# Patient Record
Sex: Female | Born: 1940
Health system: Southern US, Community
[De-identification: ages and names within clinical notes are randomized; demographics above are authoritative.]

## PROBLEM LIST (undated history)

## (undated) DIAGNOSIS — E785 Hyperlipidemia, unspecified: Secondary | ICD-10-CM

## (undated) DIAGNOSIS — Z83518 Family history of other specified eye disorder: Secondary | ICD-10-CM

## (undated) DIAGNOSIS — J449 Chronic obstructive pulmonary disease, unspecified: Secondary | ICD-10-CM

## (undated) DIAGNOSIS — N760 Acute vaginitis: Secondary | ICD-10-CM

## (undated) DIAGNOSIS — B9689 Other specified bacterial agents as the cause of diseases classified elsewhere: Secondary | ICD-10-CM

## (undated) DIAGNOSIS — N390 Urinary tract infection, site not specified: Secondary | ICD-10-CM

## (undated) HISTORY — PX: FRACTURE SURGERY: SHX138

## (undated) HISTORY — DX: Family history of other specified eye disorder: Z83.518

## (undated) HISTORY — DX: Urinary tract infection, site not specified: N39.0

## (undated) HISTORY — DX: Acute vaginitis: N76.0

## (undated) HISTORY — DX: Hyperlipidemia, unspecified: E78.5

## (undated) HISTORY — DX: Other specified bacterial agents as the cause of diseases classified elsewhere: B96.89

## (undated) HISTORY — DX: Chronic obstructive pulmonary disease, unspecified: J44.9

---

## 2002-11-07 ENCOUNTER — Encounter: Payer: Self-pay | Admitting: Emergency Medicine

## 2002-11-07 ENCOUNTER — Emergency Department (HOSPITAL_COMMUNITY): Admission: EM | Admit: 2002-11-07 | Discharge: 2002-11-07 | Payer: Self-pay | Admitting: Psychology

## 2003-05-17 ENCOUNTER — Ambulatory Visit (HOSPITAL_COMMUNITY): Admission: RE | Admit: 2003-05-17 | Discharge: 2003-05-17 | Payer: Self-pay | Admitting: Internal Medicine

## 2003-05-17 ENCOUNTER — Encounter: Payer: Self-pay | Admitting: Internal Medicine

## 2004-02-26 ENCOUNTER — Other Ambulatory Visit: Admission: RE | Admit: 2004-02-26 | Discharge: 2004-02-26 | Payer: Self-pay | Admitting: Dermatology

## 2004-05-14 ENCOUNTER — Ambulatory Visit (HOSPITAL_COMMUNITY): Admission: RE | Admit: 2004-05-14 | Discharge: 2004-05-14 | Payer: Self-pay | Admitting: Family Medicine

## 2004-05-21 ENCOUNTER — Ambulatory Visit (HOSPITAL_COMMUNITY): Admission: RE | Admit: 2004-05-21 | Discharge: 2004-05-21 | Payer: Self-pay | Admitting: Ophthalmology

## 2004-06-04 ENCOUNTER — Ambulatory Visit (HOSPITAL_COMMUNITY): Admission: RE | Admit: 2004-06-04 | Discharge: 2004-06-04 | Payer: Self-pay | Admitting: Ophthalmology

## 2005-06-24 ENCOUNTER — Ambulatory Visit (HOSPITAL_COMMUNITY): Admission: RE | Admit: 2005-06-24 | Discharge: 2005-06-24 | Payer: Self-pay | Admitting: Family Medicine

## 2006-06-25 ENCOUNTER — Ambulatory Visit (HOSPITAL_COMMUNITY): Admission: RE | Admit: 2006-06-25 | Discharge: 2006-06-25 | Payer: Self-pay | Admitting: Family Medicine

## 2007-07-09 LAB — HM DEXA SCAN

## 2007-07-12 ENCOUNTER — Ambulatory Visit (HOSPITAL_COMMUNITY): Admission: RE | Admit: 2007-07-12 | Discharge: 2007-07-12 | Payer: Self-pay | Admitting: Family Medicine

## 2008-01-04 ENCOUNTER — Ambulatory Visit (HOSPITAL_COMMUNITY): Admission: RE | Admit: 2008-01-04 | Discharge: 2008-01-04 | Payer: Self-pay | Admitting: Ophthalmology

## 2008-01-18 ENCOUNTER — Ambulatory Visit (HOSPITAL_COMMUNITY): Admission: RE | Admit: 2008-01-18 | Discharge: 2008-01-18 | Payer: Self-pay | Admitting: Ophthalmology

## 2008-07-12 ENCOUNTER — Ambulatory Visit (HOSPITAL_COMMUNITY): Admission: RE | Admit: 2008-07-12 | Discharge: 2008-07-12 | Payer: Self-pay | Admitting: Family Medicine

## 2008-10-09 ENCOUNTER — Ambulatory Visit (HOSPITAL_COMMUNITY): Admission: RE | Admit: 2008-10-09 | Discharge: 2008-10-09 | Payer: Self-pay | Admitting: Family Medicine

## 2008-10-26 ENCOUNTER — Inpatient Hospital Stay (HOSPITAL_COMMUNITY): Admission: EM | Admit: 2008-10-26 | Discharge: 2008-10-30 | Payer: Self-pay | Admitting: Family Medicine

## 2009-01-29 ENCOUNTER — Emergency Department: Payer: Self-pay | Admitting: Emergency Medicine

## 2009-02-02 ENCOUNTER — Ambulatory Visit: Payer: Self-pay | Admitting: General Practice

## 2009-07-24 ENCOUNTER — Ambulatory Visit (HOSPITAL_COMMUNITY): Admission: RE | Admit: 2009-07-24 | Discharge: 2009-07-24 | Payer: Self-pay | Admitting: Family Medicine

## 2010-03-24 ENCOUNTER — Inpatient Hospital Stay (HOSPITAL_COMMUNITY): Admission: EM | Admit: 2010-03-24 | Discharge: 2010-03-27 | Payer: Self-pay | Admitting: Emergency Medicine

## 2010-03-27 ENCOUNTER — Inpatient Hospital Stay: Admission: AD | Admit: 2010-03-27 | Discharge: 2010-04-16 | Payer: Self-pay | Admitting: Internal Medicine

## 2010-07-25 ENCOUNTER — Ambulatory Visit (HOSPITAL_COMMUNITY): Admission: RE | Admit: 2010-07-25 | Discharge: 2010-07-25 | Payer: Self-pay | Admitting: Family Medicine

## 2011-02-25 LAB — CROSSMATCH
ABO/RH(D): O POS
Antibody Screen: NEGATIVE

## 2011-02-25 LAB — DIFFERENTIAL
Basophils Relative: 1 % (ref 0–1)
Eosinophils Relative: 1 % (ref 0–5)
Lymphocytes Relative: 13 % (ref 12–46)
Lymphs Abs: 1.9 10*3/uL (ref 0.7–4.0)
Monocytes Absolute: 0.4 10*3/uL (ref 0.1–1.0)

## 2011-02-25 LAB — ABO/RH: ABO/RH(D): O POS

## 2011-02-25 LAB — BASIC METABOLIC PANEL
BUN: 11 mg/dL (ref 6–23)
BUN: 9 mg/dL (ref 6–23)
CO2: 29 mEq/L (ref 19–32)
CO2: 30 mEq/L (ref 19–32)
Calcium: 8.4 mg/dL (ref 8.4–10.5)
Chloride: 101 mEq/L (ref 96–112)
Chloride: 102 mEq/L (ref 96–112)
Chloride: 104 mEq/L (ref 96–112)
Creatinine, Ser: 0.56 mg/dL (ref 0.4–1.2)
GFR calc Af Amer: >60 mL/min (ref >60)
GFR calc Af Amer: >60 mL/min (ref >60)
GFR calc Af Amer: >60 mL/min (ref >60)
Glucose, Bld: 94 mg/dL (ref 70–99)
Potassium: 3.8 mEq/L (ref 3.5–5.1)
Potassium: 3.9 mEq/L (ref 3.5–5.1)
Potassium: 3.9 mEq/L (ref 3.5–5.1)
Sodium: 135 mEq/L (ref 135–145)
Sodium: 135 mEq/L (ref 135–145)

## 2011-02-25 LAB — URINALYSIS, ROUTINE W REFLEX MICROSCOPIC
Bilirubin Urine: NEGATIVE
Specific Gravity, Urine: 1.022 (ref 1.005–1.030)
Urobilinogen, UA: 0.2 mg/dL (ref 0.0–1.0)

## 2011-02-25 LAB — CBC
MCHC: 33.5 g/dL (ref 30.0–36.0)
MCHC: 33.9 g/dL (ref 30.0–36.0)
MCV: 93.3 fL (ref 78.0–100.0)
MCV: 93.3 fL (ref 78.0–100.0)
Platelets: 377 10*3/uL (ref 150–400)
Platelets: 409 10*3/uL — ABNORMAL HIGH (ref 150–400)
RBC: 4.69 MIL/uL (ref 3.87–5.11)
RDW: 14.3 % (ref 11.5–15.5)
WBC: 14.5 10*3/uL — ABNORMAL HIGH (ref 4.0–10.5)

## 2011-04-22 NOTE — Discharge Summary (Signed)
NAMESHEVETTE, [name]               ACCOUNT NO.:  [id number]   MEDICAL RECORD NO.:  [id number]          PATIENT TYPE:  INP   LOCATION:  1427                         FACILITY:  [facility]   PHYSICIAN:  Hillery Aldo, M.D.   DATE OF BIRTH:  December 02, 1941   DATE OF ADMISSION:  10/26/2008  DATE OF DISCHARGE:  10/30/2008                               DISCHARGE SUMMARY   PRIMARY CARE PHYSICIAN:  Dr. Christell Constant at [facility]   DISCHARGE DIAGNOSES:  1. Left lower lobe pneumonia.  2. Chronic obstructive pulmonary disease with acute exacerbation.  3. Oral candidiasis.  4. Dyslipidemia.  5. Steroid-induced hyperglycemia.  6. Hypertension.   DISCHARGE MEDICATIONS:  1. Foradil 2 puffs b.i.d.  2. Flovent 2 puffs b.i.d.  3. Albuterol metered-dose inhaler 2 puffs q.6 h. p.r.n.  4. Prednisone 60 mg daily, taper by 10 mg to off.  5. Spiriva 1 capsule daily.  6. Tricor 145 mg daily.  7. Avelox 400 mg daily x7 days.  8. Mucinex 600 mg b.i.d. p.r.n.  9. Lopressor 25 mg b.i.d.   CONSULTATIONS:  None.   BRIEF ADMISSION HISTORY OF PRESENT ILLNESS:  The patient is a 70-year-  old female who presented to the hospital with chief complaint of dyspnea  and cough.  She has been having upper respiratory symptoms for the past  2 months, and essentially failed outpatient treatment with prednisone  and several courses of antibiotics including a round of doxycycline and  a round of Levaquin.  She was febrile and hypoxic on admission.  For the  full details, please see the dictated report done by Dr. Francene Boyers.   PROCEDURES AND DIAGNOSTIC STUDIES:  Chest x-ray on October 26, 2008  showed changes of COPD, left lower lobe air space infiltrate consistent  with pneumonia new since CT scan dated October 09, 2008.   DISCHARGE LABORATORY VALUES:  White blood cell count was 12.7,  hemoglobin 13.7, hematocrit 40.6, platelets 430.  Sodium was 133,  potassium 4.1, chloride 98, bicarb 27, BUN 18, creatinine  0.53, glucose  165.  Blood cultures were negative x2.  Sputum cultures proved a few  colonies of Candida albicans.   HOSPITAL COURSE BY PROBLEM:  1. Left lower lobe pneumonia with acute infectious COPD exacerbation:      The patient has completed 4 days of therapy with Rocephin and      azithromycin.  Her white blood cell count has trended down over      time, although the patient still has low grade fever.  Clinically,      she feels much better, and is requesting discharge home.  We will      send her out with an additional 7 days of treatment with Avelox and      a prednisone taper.  The patient was treated with IV Solu-Medrol      while in the hospital which was tapered as tolerated.  Her air      movement through the lungs has improved significantly.  She never      had any acute bronchospasm.  2. Oral candidiasis:  The  patient was treated with oral Nystatin which      resolved her thrush quite nicely.  3. Dyslipidemia:  The patient was maintained on her usual outpatient      dose of Tricor.  4. Steroid-induced hyperglycemia:  The patient was covered with      sliding scale insulin while in the hospital.  It is expected that      her sugars will return to normal with tapering of steroids.   DISPOSITION:  The patient is medically stable for discharge home.  She  is instructed to call her primary care physician's office, and to  arrange for hospital follow-up appointment in 1 week.   Time spent coordinating care for discharge equals 35 minutes.      Hillery Aldo, M.D.  Electronically Signed     CR/MEDQ  D:  10/30/2008  T:  10/30/2008  Job:  [id number]   cc:   Christell Constant, M.D.  [facility]

## 2011-04-22 NOTE — H&P (Signed)
[name], [name]               ACCOUNT NO.:  [id number]   MEDICAL RECORD NO.:  [id number]          PATIENT TYPE:  EMS   LOCATION:  ED                           FACILITY:  [facility]   PHYSICIAN:  Estelle Grumbles, MDDATE OF BIRTH:  06/18/1941   DATE OF ADMISSION:  10/26/2008  DATE OF DISCHARGE:                              HISTORY & PHYSICAL   PRIMARY CARE PHYSICIAN:  [employer].   CHIEF COMPLAINT:  Dyspnea, cough.   HISTORY OF PRESENT ILLNESS:  [name] is a 70 year old female with a  history of COPD, who presented to the emergency room with dyspnea.  She  was actually referred from her primary care physician's office.  The  patient states she has been feeling sick for the past two months.  She  was having a cough initially with clear phlegm, currently she is  producing dark yellowish phlegm.  She had worsening dyspnea for the past  two months and she has been following with her primary care physician.  She was treated with multiple courses of prednisone and according to her  primary care physician report she also received doxycycline and Levaquin  course as an outpatient and underwent a CT of the chest a few weeks ago  for evaluation of a lung nodule which came back as chronic old fracture  of the rub.  Today she was again in her primary care physician's office  for followup and the patient had a high-grade fever up to 102.8.  She  was hypoxic.  The patient felt really bad so she was referred to the  emergency room for further evaluation and management.  The patient  states she has been having a fever off and on for the past four or five  days and her dyspnea has also been getting worse.  Complaining of  generalized weakness.  She denies having any chest pain, palpitations.  No nausea or vomiting.  She does have some dizziness with ambulation.  Recently she was also noted to have hypertension in the past few weeks.  Flu test at primary care physician's office  was negative.   REVIEW OF SYSTEMS:  The patient denies having any headache.  She used to  have dizziness with ambulation in the recent past, currently denies  having dizziness, nausea or vomiting.  Denies having any visual or  auditory symptoms.  No dysphagia, dysarthria or sore throat.  She denies  having any symptoms apart from the fever, cough and generalized weakness  which could represent the flu.  Denies having orthopnea, paroxysmal  nocturnal dyspnea.  Denies having abdominal pain, nausea, vomiting,  diarrhea.  Denies having dysuria, frequency or urgency of urination.  Denies having any focal weakness, tingling or numbness.  Denies  arthralgias or myalgias.  Review of systems were otherwise negative.   PAST MEDICAL HISTORY:  Hypercholesterolemia, COPD, osteoporosis, chronic  urinary tract infection.   SURGICAL HISTORY:  Bilateral cataract surgery.   SOCIAL HISTORY:  She used to smoke one pack of cigarettes a day from the  age of 87 to 50.  During that time she tried to  quit multiple times off  and on and finally she quit smoking at age 21.  Denies alcohol or drug  use.   FAMILY HISTORY:  Father deceased with pancreatic cancer, he also had  COPD.  Mother has diabetes and hypertension.  Her brother has coronary  artery disease and had coronary artery bypass surgery done.   HOME MEDICATIONS:  According to the primary care physician's report, the  patient was on prednisone 60 mg daily, Foradil twice a day inhaler,  Atrovent two puffs 4 times a day, Flovent 4 puffs twice a day, TriCor  145 mg daily, albuterol two puffs as needed, Mucinex twice a day, Ambien  at bedtime.   PHYSICAL EXAMINATION:  GENERAL:  The patient is awake, alert and  oriented, not in acute distress.  Vital signs:  Temperature 99.9, blood  pressure 161/80, pulse 130-140, respiration 24, O2 saturation 92% on  oxygen supplementation.  HEENT:  Head normocephalic, atraumatic.  Pupils equal and reactive to  light  and accommodation.  No icterus was noted.  Oral cavity:  Dry oral  mucosa.  NECK:  Supple, no JVD noted.  CHEST:  Bilateral fair air entry, positive rales in the left lower lung  region.  HEART:  S1 and S1, regular rate and rhythm, tachycardic.  ABDOMEN: Soft, bowel sounds positive, nontender, nondistended, no  guarding or rigidity.  EXTREMITIES: No edema, no calf tenderness.  No erythema or swelling was  noted.   LABORATORY DATA:  ABG shows pH 7.52, pCO2 32, pO2 63.8 on 3 liters  oxygen via nasal cannula.  White count 16.3, hemoglobin 16.3, hematocrit  48, platelet count 480.  INR 1.1.  Sodium 132, potassium 3.8, chloride  97, glucose 107, BUN 14, creatinine 0.6, CK-MB less than 1, troponin  less than 0.05, BNP 110.  Blood cultures x2 were done in the emergency  room.   EKG:  Sinus tachycardia at a rate of 137 beats per minute, normal axis,  no acute ST-T changes.   IMPRESSION:  1. Left lower lobe pneumonia with hypoxemia and chronic obstructive      pulmonary disease exacerbation.  2. Sinus tachycardia, could be secondary to the albuterol inhaler.  3. High blood pressure, it could be stress related.  Will monitor the      blood pressure closely.  4. History of hypercholesterolemia.   PLAN:  Will admit the patient to the telemetry unit.  Will start her on  Atrovent and Xopenex nebulizers q.6 hours.  Will obtain respiratory  cultures.  Will followup on the blood cultures x2.  Will start the  patient on Rocephin and Zithromax.  Will also place her on Solu-Medrol  40 mg IV q.8 hours.  Currently, the patient does not have any  significant bronchospasm.  Her symptoms could be secondary to the  hypoxemia and pneumonia.  If the patient shows any significant signs of  bronchospasm will adjust the steroid dose as needed.  Will resume the  Flovent inhaler and TriCor.  Will place the patient on incentive  spirometry q.2 hours as tolerated.  Follow the pulse ox on room-air q.8  hours.  In  view of significantly high blood pressure will start her on  Lopressor 25 mg orally twice a day with close monitoring.  Will follow  the blood pressure and pulmonary status closely.  DVT prophylaxis with  Lovenox.      Estelle Grumbles, MD  Electronically Signed     TP/MEDQ  D:  10/26/2008  T:  10/26/2008  Job:  [id number]   cc:   [facility]  [facility]  fax [phone]

## 2011-06-18 ENCOUNTER — Encounter: Payer: Self-pay | Admitting: Family Medicine

## 2011-06-18 DIAGNOSIS — E785 Hyperlipidemia, unspecified: Secondary | ICD-10-CM | POA: Insufficient documentation

## 2011-06-18 DIAGNOSIS — M810 Age-related osteoporosis without current pathological fracture: Secondary | ICD-10-CM | POA: Insufficient documentation

## 2011-06-18 DIAGNOSIS — J449 Chronic obstructive pulmonary disease, unspecified: Secondary | ICD-10-CM | POA: Insufficient documentation

## 2011-06-18 DIAGNOSIS — N390 Urinary tract infection, site not specified: Secondary | ICD-10-CM | POA: Insufficient documentation

## 2011-06-18 DIAGNOSIS — Z83518 Family history of other specified eye disorder: Secondary | ICD-10-CM | POA: Insufficient documentation

## 2011-08-07 ENCOUNTER — Other Ambulatory Visit: Payer: Self-pay | Admitting: Family Medicine

## 2011-08-07 DIAGNOSIS — Z139 Encounter for screening, unspecified: Secondary | ICD-10-CM

## 2011-08-14 ENCOUNTER — Ambulatory Visit (HOSPITAL_COMMUNITY)
Admission: RE | Admit: 2011-08-14 | Discharge: 2011-08-14 | Disposition: A | Discharge status: Home / Self Care | Payer: Medicare Other | Source: Ambulatory Visit | Attending: Family Medicine | Admitting: Family Medicine

## 2011-08-14 DIAGNOSIS — Z1231 Encounter for screening mammogram for malignant neoplasm of breast: Secondary | ICD-10-CM | POA: Insufficient documentation

## 2011-08-14 DIAGNOSIS — Z139 Encounter for screening, unspecified: Secondary | ICD-10-CM

## 2011-09-10 LAB — BASIC METABOLIC PANEL
CO2: 27
Calcium: 8.2 — ABNORMAL LOW
Calcium: 8.5
Chloride: 98
GFR calc Af Amer: >60
GFR calc Af Amer: >60
GFR calc non Af Amer: >60
Glucose, Bld: 165 — ABNORMAL HIGH
Glucose, Bld: 255 — ABNORMAL HIGH
Potassium: 3.8
Potassium: 4.1
Sodium: 133 — ABNORMAL LOW
Sodium: 134 — ABNORMAL LOW

## 2011-09-10 LAB — CBC
HCT: 40.6
HCT: 42.5
Hemoglobin: 13.7
Hemoglobin: 14.1
MCHC: 33.3
MCHC: 33.7
Platelets: 480 — ABNORMAL HIGH
RBC: 4.41
RBC: 4.54
RDW: 15.1
RDW: 15.1
WBC: 16.3 — ABNORMAL HIGH

## 2011-09-10 LAB — BLOOD GAS, ARTERIAL
Acid-Base Excess: 4.2 — ABNORMAL HIGH
Bicarbonate: 26.3 — ABNORMAL HIGH
O2 Saturation: 94
TCO2: 22.2
pO2, Arterial: 63.8 — ABNORMAL LOW

## 2011-09-10 LAB — POCT I-STAT, CHEM 8
BUN: 14
Creatinine, Ser: 0.6
Glucose, Bld: 107 — ABNORMAL HIGH
Hemoglobin: 16.3 — ABNORMAL HIGH
Potassium: 3.8
Sodium: 132 — ABNORMAL LOW

## 2011-09-10 LAB — CULTURE, BLOOD (ROUTINE X 2): Culture: NO GROWTH

## 2011-09-10 LAB — CULTURE, RESPIRATORY W GRAM STAIN

## 2011-09-10 LAB — DIFFERENTIAL
Basophils Absolute: 0
Eosinophils Absolute: 0
Lymphocytes Relative: 4 — ABNORMAL LOW
Lymphs Abs: 0.7
Neutrophils Relative %: 94 — ABNORMAL HIGH

## 2011-09-10 LAB — B-NATRIURETIC PEPTIDE (CONVERTED LAB): Pro B Natriuretic peptide (BNP): 110 — ABNORMAL HIGH

## 2011-09-10 LAB — MAGNESIUM: Magnesium: 2

## 2011-09-10 LAB — PROTIME-INR
INR: 1.1
Prothrombin Time: 14.4

## 2011-09-10 LAB — POCT CARDIAC MARKERS
CKMB, poc: <1 — ABNORMAL LOW
Myoglobin, poc: 76.8

## 2011-09-10 LAB — GLUCOSE, CAPILLARY: Glucose-Capillary: 192 — ABNORMAL HIGH

## 2011-09-10 LAB — PHOSPHORUS: Phosphorus: 3.4

## 2011-11-18 ENCOUNTER — Other Ambulatory Visit: Payer: Self-pay | Admitting: Family Medicine

## 2011-11-18 DIAGNOSIS — R102 Pelvic and perineal pain: Secondary | ICD-10-CM

## 2011-11-21 ENCOUNTER — Ambulatory Visit (HOSPITAL_COMMUNITY)
Admission: RE | Admit: 2011-11-21 | Discharge: 2011-11-21 | Disposition: A | Discharge status: Home / Self Care | Payer: Medicare Other | Source: Ambulatory Visit | Attending: Family Medicine | Admitting: Family Medicine

## 2011-11-21 DIAGNOSIS — R9389 Abnormal findings on diagnostic imaging of other specified body structures: Secondary | ICD-10-CM | POA: Insufficient documentation

## 2011-11-21 DIAGNOSIS — R102 Pelvic and perineal pain: Secondary | ICD-10-CM

## 2011-11-21 DIAGNOSIS — N949 Unspecified condition associated with female genital organs and menstrual cycle: Secondary | ICD-10-CM | POA: Insufficient documentation

## 2011-11-21 DIAGNOSIS — Z780 Asymptomatic menopausal state: Secondary | ICD-10-CM | POA: Insufficient documentation

## 2012-04-05 ENCOUNTER — Telehealth: Payer: Self-pay

## 2012-04-05 ENCOUNTER — Other Ambulatory Visit: Payer: Self-pay

## 2012-04-05 DIAGNOSIS — Z139 Encounter for screening, unspecified: Secondary | ICD-10-CM

## 2012-04-05 NOTE — Telephone Encounter (Signed)
Gastroenterology Pre-Procedure Form    Request Date: 04/05/2012     Requesting Physician: Pt called her self   ( Dr. Tanya Nones is PCP )     PATIENT INFORMATION:  [name] is a 71 y.o., female (DOB=[birth year]).  PROCEDURE: Procedure(s) requested: colonoscopy Procedure Reason: screening for colon cancer  PATIENT REVIEW QUESTIONS: The patient reports the following:   1. Diabetes Melitis: no 2. Joint replacements in the past 12 months: no 3. Major health problems in the past 3 months: no 4. Has an artificial valve or MVP:no 5. Has been advised in past to take antibiotics in advance of a procedure like teeth cleaning: no}    MEDICATIONS & ALLERGIES:    Patient reports the following regarding taking any blood thinners:   Plavix? no Aspirin?yes  Coumadin?  no  Patient confirms/reports the following medications:  Current Outpatient Prescriptions  Medication Sig Dispense Refill  . aspirin 81 MG tablet Take 81 mg by mouth daily. Pt said she only takes about two times a week. ( it causes her to cough)      . calcium-vitamin D (OSCAL WITH D) 250-125 MG-UNIT per tablet Take 1 tablet by mouth daily.        . Fluticasone-Salmeterol (ADVAIR DISKUS) 250-50 MCG/DOSE AEPB Inhale 1 puff into the lungs every 12 (twelve) hours.        Marland Kitchen ipratropium (ATROVENT) 0.02 % nebulizer solution Take 500 mcg by nebulization 2 (two) times daily.        . NON FORMULARY Allergy eye drops  Daily as directed      . zolpidem (AMBIEN) 10 MG tablet Take 10 mg by mouth at bedtime as needed.        . clemastine (TAVIST) 2.68 MG TABS Take 2.68 mg by mouth as needed.        . ergocalciferol (VITAMIN D2) 50000 UNITS capsule Take 50,000 Units by mouth once a week.        . estrogens, conjugated, (PREMARIN) 0.45 MG tablet Take 0.45 mg by mouth daily. Take daily for 21 days then do not take for 7 days.       . formoterol (FORADIL AEROLIZER) 12 MCG capsule for inhaler Place 12 mcg into inhaler and inhale 2 (two) times  daily.        Marland Kitchen olopatadine (PATANOL) 0.1 % ophthalmic solution 1 drop 2 (two) times daily.        Marland Kitchen tiotropium (SPIRIVA) 18 MCG inhalation capsule Place 18 mcg into inhaler and inhale daily.        Marland Kitchen DISCONTD: zolpidem (AMBIEN) 10 MG tablet Take 10 mg by mouth at bedtime as needed.          Patient confirms/reports the following allergies:  Allergies  Allergen Reactions  . Naproxen Sodium Other (See Comments)    Breathing difficulty  . Symbicort (Budesonide-Formoterol Fumarate) Other (See Comments)    Breathing difficulty  . Alendronate Sodium     Abdominal pain.   Sandrea Hammond (Ibandronate Sodium)     Abdominal pain.  . Risedronate Sodium   . Zetia (Ezetimibe)     Patient is appropriate to schedule for requested procedure(s): yes  AUTHORIZATION INFORMATION Primary Insurance:   ID #: Group #:  Pre-Cert / Auth required Pre-Cert / Auth #:   Secondary Insurance:   ID #:  Group #:  Pre-Cert / Auth required:  Pre-Cert / Auth #:  No orders of the defined types were placed in this encounter.    SCHEDULE INFORMATION:  Procedure has been scheduled as follows:  Date: 04/19/2012     Time: 11:30 AM  Location: [facility] Short Stay  This Gastroenterology Pre-Precedure Form is being routed to the following provider(s) for review: Jonette Eva, MD

## 2012-04-05 NOTE — Telephone Encounter (Signed)
MOVI PREP SPLIT DOSING, REGULAR BREAKFAST. CLEAR LIQUIDS AFTER 9 AM.  

## 2012-04-06 NOTE — Telephone Encounter (Signed)
Rx and instructions mailed to pt.  

## 2012-04-15 ENCOUNTER — Encounter (HOSPITAL_COMMUNITY): Payer: Self-pay | Admitting: Pharmacy Technician

## 2012-04-16 MED ORDER — SODIUM CHLORIDE 0.45 % IV SOLN
Freq: Once | INTRAVENOUS | Status: AC
  Administered 2012-04-19: 11:00:00 via INTRAVENOUS

## 2012-04-19 ENCOUNTER — Encounter (HOSPITAL_COMMUNITY)
Admission: RE | Disposition: A | Discharge status: Home / Self Care | Payer: Self-pay | Source: Ambulatory Visit | Attending: Gastroenterology

## 2012-04-19 ENCOUNTER — Encounter (HOSPITAL_COMMUNITY): Payer: Self-pay | Admitting: *Deleted

## 2012-04-19 ENCOUNTER — Ambulatory Visit (HOSPITAL_COMMUNITY)
Admission: RE | Admit: 2012-04-19 | Discharge: 2012-04-19 | Disposition: A | Discharge status: Home / Self Care | Payer: Medicare Other | Source: Ambulatory Visit | Attending: Gastroenterology | Admitting: Gastroenterology

## 2012-04-19 DIAGNOSIS — Z7982 Long term (current) use of aspirin: Secondary | ICD-10-CM | POA: Insufficient documentation

## 2012-04-19 DIAGNOSIS — J4489 Other specified chronic obstructive pulmonary disease: Secondary | ICD-10-CM | POA: Insufficient documentation

## 2012-04-19 DIAGNOSIS — Z1211 Encounter for screening for malignant neoplasm of colon: Secondary | ICD-10-CM

## 2012-04-19 DIAGNOSIS — Z139 Encounter for screening, unspecified: Secondary | ICD-10-CM

## 2012-04-19 DIAGNOSIS — K648 Other hemorrhoids: Secondary | ICD-10-CM

## 2012-04-19 DIAGNOSIS — D126 Benign neoplasm of colon, unspecified: Secondary | ICD-10-CM

## 2012-04-19 DIAGNOSIS — J449 Chronic obstructive pulmonary disease, unspecified: Secondary | ICD-10-CM | POA: Insufficient documentation

## 2012-04-19 DIAGNOSIS — K5730 Diverticulosis of large intestine without perforation or abscess without bleeding: Secondary | ICD-10-CM

## 2012-04-19 DIAGNOSIS — E785 Hyperlipidemia, unspecified: Secondary | ICD-10-CM | POA: Insufficient documentation

## 2012-04-19 HISTORY — PX: COLONOSCOPY: SHX5424

## 2012-04-19 SURGERY — COLONOSCOPY
Anesthesia: Moderate Sedation

## 2012-04-19 MED ORDER — MIDAZOLAM HCL 5 MG/5ML IJ SOLN
INTRAMUSCULAR | Status: AC
  Filled 2012-04-19: qty 10

## 2012-04-19 MED ORDER — MEPERIDINE HCL 100 MG/ML IJ SOLN
INTRAMUSCULAR | Status: DC | PRN
  Administered 2012-04-19 (×2): 25 mg via INTRAVENOUS

## 2012-04-19 MED ORDER — MIDAZOLAM HCL 5 MG/5ML IJ SOLN
INTRAMUSCULAR | Status: DC | PRN
  Administered 2012-04-19 (×2): 2 mg via INTRAVENOUS
  Administered 2012-04-19: 1 mg via INTRAVENOUS

## 2012-04-19 MED ORDER — MEPERIDINE HCL 100 MG/ML IJ SOLN
INTRAMUSCULAR | Status: AC
  Filled 2012-04-19: qty 1

## 2012-04-19 MED ORDER — STERILE WATER FOR IRRIGATION IR SOLN
Status: DC | PRN
  Administered 2012-04-19: 11:00:00

## 2012-04-19 NOTE — Discharge Instructions (Signed)
You had 1 small polyp removed. You have small internal hemorrhoids and diverticulosis IN YOUR ENTIRE COLON.   FOLLOW A HIGH FIBER DIET. AVOID ITEMS THAT CAUSE BLOATING. SEE INFO BELOW. YOUR BIOPSY RESULTS SHOULD BE BACK IN 7 DAYS. Next colonoscopy in 10 years IF THE BENEFITS OUTWEIGH THE RISKS.   Colonoscopy Care After Read the instructions outlined below and refer to this sheet in the next week. These discharge instructions provide you with general information on caring for yourself after you leave the hospital. While your treatment has been planned according to the most current medical practices available, unavoidable complications occasionally occur. If you have any problems or questions after discharge, call DR. Alliyah Roesler, [phone].  ACTIVITY  You may resume your regular activity, but move at a slower pace for the next 24 hours.   Take frequent rest periods for the next 24 hours.   Walking will help get rid of the air and reduce the bloated feeling in your belly (abdomen).   No driving for 24 hours (because of the medicine (anesthesia) used during the test).   You may shower.   Do not sign any important legal documents or operate any machinery for 24 hours (because of the anesthesia used during the test).    NUTRITION  Drink plenty of fluids.   You may resume your normal diet as instructed by your doctor.   Begin with a light meal and progress to your normal diet. Heavy or fried foods are harder to digest and may make you feel sick to your stomach (nauseated).   Avoid alcoholic beverages for 24 hours or as instructed.    MEDICATIONS  You may resume your normal medications.   WHAT YOU CAN EXPECT TODAY  Some feelings of bloating in the abdomen.   Passage of more gas than usual.   Spotting of blood in your stool or on the toilet paper  .  IF YOU HAD POLYPS REMOVED DURING THE COLONOSCOPY:  Eat a soft diet IF YOU HAVE NAUSEA, BLOATING, ABDOMINAL PAIN, OR  VOMITING.    FINDING OUT THE RESULTS OF YOUR TEST Not all test results are available during your visit. DR. Darrick Penna WILL CALL YOU WITHIN 7 DAYS OF YOUR PROCEDUE WITH YOUR RESULTS. Do not assume everything is normal if you have not heard from DR. Arnulfo Batson IN ONE WEEK, CALL HER OFFICE AT [phone].  SEEK IMMEDIATE MEDICAL ATTENTION AND CALL THE OFFICE: [phone] IF:  You have more than a spotting of blood in your stool.   Your belly is swollen (abdominal distention).   You are nauseated or vomiting.   You have a temperature over 101F.   You have abdominal pain or discomfort that is severe or gets worse throughout the day.  Polyps, Colon  A polyp is extra tissue that grows inside your body. Colon polyps grow in the large intestine. The large intestine, also called the colon, is part of your digestive system. It is a long, hollow tube at the end of your digestive tract where your body makes and stores stool. Most polyps are not dangerous. They are benign. This means they are not cancerous. But over time, some types of polyps can turn into cancer. Polyps that are smaller than a pea are usually not harmful. But larger polyps could someday become or may already be cancerous. To be safe, doctors remove all polyps and test them.   WHO GETS POLYPS? Anyone can get polyps, but certain people are more likely than others. You may have  a greater chance of getting polyps if:  You are over 50.   You have had polyps before.   Someone in your family has had polyps.   Someone in your family has had cancer of the large intestine.   Find out if someone in your family has had polyps. You may also be more likely to get polyps if you:   Eat a lot of fatty foods   Smoke   Drink alcohol   Do not exercise  Eat too much   TREATMENT  The caregiver will remove the polyp during sigmoidoscopy or colonoscopy.  PREVENTION There is not one sure way to prevent polyps. You might be able to lower your risk  of getting them if you:  Eat more fruits and vegetables and less fatty food.   Do not smoke.   Avoid alcohol.   Exercise every day.   Lose weight if you are overweight.   Eating more calcium and folate can also lower your risk of getting polyps. Some foods that are rich in calcium are milk, cheese, and broccoli. Some foods that are rich in folate are chickpeas, kidney beans, and spinach.   High-Fiber Diet A high-fiber diet changes your normal diet to include more whole grains, legumes, fruits, and vegetables. Changes in the diet involve replacing refined carbohydrates with unrefined foods. The calorie level of the diet is essentially unchanged. The Dietary Reference Intake (recommended amount) for adult males is 38 grams per day. For adult females, it is 25 grams per day. Pregnant and lactating women should consume 28 grams of fiber per day. Fiber is the intact part of a plant that is not broken down during digestion. Functional fiber is fiber that has been isolated from the plant to provide a beneficial effect in the body. PURPOSE  Increase stool bulk.   Ease and regulate bowel movements.   Lower cholesterol.  INDICATIONS THAT YOU NEED MORE FIBER  Constipation and hemorrhoids.   Uncomplicated diverticulosis (intestine condition) and irritable bowel syndrome.   Weight management.   As a protective measure against hardening of the arteries (atherosclerosis), diabetes, and cancer.   GUIDELINES FOR INCREASING FIBER IN THE DIET  Start adding fiber to the diet slowly. A gradual increase of about 5 more grams (2 slices of whole-wheat bread, 2 servings of most fruits or vegetables, or 1 bowl of high-fiber cereal) per day is best. Too rapid an increase in fiber may result in constipation, flatulence, and bloating.   Drink enough water and fluids to keep your urine clear or pale yellow. Water, juice, or caffeine-free drinks are recommended. Not drinking enough fluid may cause  constipation.   Eat a variety of high-fiber foods rather than one type of fiber.   Try to increase your intake of fiber through using high-fiber foods rather than fiber pills or supplements that contain small amounts of fiber.   The goal is to change the types of food eaten. Do not supplement your present diet with high-fiber foods, but replace foods in your present diet.  INCLUDE A VARIETY OF FIBER SOURCES  Replace refined and processed grains with whole grains, canned fruits with fresh fruits, and incorporate other fiber sources. White rice, white breads, and most bakery goods contain little or no fiber.   Brown whole-grain rice, buckwheat oats, and many fruits and vegetables are all good sources of fiber. These include: broccoli, Brussels sprouts, cabbage, cauliflower, beets, sweet potatoes, white potatoes (skin on), carrots, tomatoes, eggplant, squash, berries, fresh fruits, and  dried fruits.   Cereals appear to be the richest source of fiber. Cereal fiber is found in whole grains and bran. Bran is the fiber-rich outer coat of cereal grain, which is largely removed in refining. In whole-grain cereals, the bran remains. In breakfast cereals, the largest amount of fiber is found in those with "bran" in their names. The fiber content is sometimes indicated on the label.   You may need to include additional fruits and vegetables each day.   In baking, for 1 cup white flour, you may use the following substitutions:   1 cup whole-wheat flour minus 2 tablespoons.   1/2 cup white flour plus 1/2 cup whole-wheat flour.   Diverticulosis Diverticulosis is a common condition that develops when small pouches (diverticula) form in the wall of the colon. The risk of diverticulosis increases with age. It happens more often in people who eat a low-fiber diet. Most individuals with diverticulosis have no symptoms. Those individuals with symptoms usually experience belly (abdominal) pain, constipation, or  loose stools (diarrhea).  HOME CARE INSTRUCTIONS  Increase the amount of fiber in your diet as directed by your caregiver or dietician. This may reduce symptoms of diverticulosis.   Drink at least 6 to 8 glasses of water each day to prevent constipation.   Try not to strain when you have a bowel movement.   Avoiding nuts and seeds to prevent complications is still an uncertain benefit.       FOODS HAVING HIGH FIBER CONTENT INCLUDE:  Fruits. Apple, peach, pear, tangerine, raisins, prunes.   Vegetables. Brussels sprouts, asparagus, broccoli, cabbage, carrot, cauliflower, romaine lettuce, spinach, summer squash, tomato, winter squash, zucchini.   Starchy Vegetables. Baked beans, kidney beans, lima beans, split peas, lentils, potatoes (with skin).   Grains. Whole wheat bread, brown rice, bran flake cereal, plain oatmeal, white rice, shredded wheat, bran muffins.    SEEK IMMEDIATE MEDICAL CARE IF:  You develop increasing pain or severe bloating.   You have an oral temperature above 101F.   You develop vomiting or bowel movements that are bloody or black.   Hemorrhoids Hemorrhoids are dilated (enlarged) veins around the rectum. Sometimes clots will form in the veins. This makes them swollen and painful. These are called thrombosed hemorrhoids. Causes of hemorrhoids include:  Constipation.   Straining to have a bowel movement.   HEAVY LIFTING HOME CARE INSTRUCTIONS  Eat a well balanced diet and drink 6 to 8 glasses of water every day to avoid constipation. You may also use a bulk laxative.   Avoid straining to have bowel movements.   Keep anal area dry and clean.   Do not use a donut shaped pillow or sit on the toilet for long periods. This increases blood pooling and pain.   Move your bowels when your body has the urge; this will require less straining and will decrease pain and pressure.

## 2012-04-19 NOTE — Op Note (Signed)
[facility] [address] [place], [place]  [address]  COLONOSCOPY PROCEDURE REPORT  PATIENT:  [name], [name]  MR#:  [id number] BIRTHDATE:  Jan 17, 1941, 70 yrs. old  GENDER:  female  ENDOSCOPIST:  Jonette Eva, MD REF. BY:  Lynnea Ferrier, M.D. ASSISTANT:  PROCEDURE DATE:  04/19/2012 PROCEDURE:  Colonoscopy with biopsy  INDICATIONS:  SCREENING  MEDICATIONS:   Demerol 50 mg IV, Versed 5 mg IV  DESCRIPTION OF PROCEDURE:    Physical exam was performed. Informed consent was obtained from the patient after explaining the benefits, risks, and alternatives to procedure.  The patient was connected to monitor and placed in left lateral position. Continuous oxygen was provided by nasal cannula and IV medicine administered through an indwelling cannula.  After administration of sedation and rectal exam, the patient's rectum was intubated and the EC-3490Li ([id number]) colonoscope was advanced under direct visualization to the cecum.  The scope was removed slowly by carefully examining the color, texture, anatomy, and integrity mucosa on the way out.  The patient was recovered in endoscopy and discharged home in satisfactory condition. <<PROCEDUREIMAGES>>  FINDINGS:  A 4 MM  Sessile polyp was found in the sigmoid colon  REMOVED VIA COLD FORCEPS.  FREQUENT Diverticula were found throughout the colon.  SMAL Internal Hemorrhoids were found.  PREP QUALITY: GOOD CECAL W/D TIME:    13.5 minutes  COMPLICATIONS:    None  ENDOSCOPIC IMPRESSION: 1) Sessile polyp in the sigmoid colon 2) SEVERE PAN-COLONIC DIVERTICULOSIS 3) SMALL INTERNAL HEMORRHOIDS  RECOMMENDATIONS: AWAIT BIOPSIES HIGH FIBER DIET TCS IN 10 YEARS WITH APEDS SCOPE IF THE BENEFITS OUTWEIGH THE RISKS  REPEAT EXAM:  No  ______________________________ Jonette Eva, MD  CC:  Lynnea Ferrier, M.D.  n. eSIGNED:   Stephani Janak at 04/19/2012 12:14 PM  [place], [phone]

## 2012-04-19 NOTE — H&P (Addendum)
  Primary Care Physician:  Leo Grosser, MD, MD Primary Gastroenterologist:  Dr. Darrick Penna  Pre-Procedure History & Physical: HPI:  [name] is a 71 y.o. female here for COLON CANCER SCREENING.   Past Medical History  Diagnosis Date  . Osteoporosis   . Hyperlipidemia   . COPD (chronic obstructive pulmonary disease)   . Chronic UTI   . BV (bacterial vaginosis)   . FH: cataracts     Past Surgical History  Procedure Date  . Fracture surgery     rt left knee cap    Prior to Admission medications   Medication Sig Start Date End Date Taking? Authorizing Provider  aspirin 500 MG EC tablet Take 500 mg by mouth every 6 (six) hours as needed. For pain   Yes Historical Provider, MD  Calcium-Magnesium-Vitamin D 500-500-100 LIQD Take 1,000 mg by mouth every morning. Take 5 milliliters  By mouth once daily   Yes Historical Provider, MD  Fluticasone-Salmeterol (ADVAIR DISKUS) 250-50 MCG/DOSE AEPB Inhale 1 puff into the lungs daily.    Yes Historical Provider, MD  ipratropium (ATROVENT) 0.02 % nebulizer solution Take 500 mcg by nebulization 2 (two) times daily as needed. For shortness of breath   Yes Historical Provider, MD  Multiple Vitamin (MULITIVITAMIN) LIQD Take 5 mLs by mouth every morning.   Yes Historical Provider, MD  naphazoline-pheniramine (EYE ALLERGY RELIEF) 0.025-0.3 % ophthalmic solution 1 drop 4 (four) times daily as needed.   Yes Historical Provider, MD  zolpidem (AMBIEN) 10 MG tablet Take 10 mg by mouth at bedtime.    Yes Historical Provider, MD    Allergies as of 04/05/2012 - never reviewed  Allergen Reaction Noted  . Naproxen sodium Other (See Comments) 06/18/2011  . Symbicort (budesonide-formoterol fumarate) Other (See Comments) 04/05/2012  . Alendronate sodium  06/18/2011  . Boniva (ibandronate sodium)  06/18/2011  . Risedronate sodium  06/18/2011  . Zetia (ezetimibe)  06/18/2011    History reviewed. No pertinent family history.  History   Social  History  . Marital Status: Married    Spouse Name: N/A    Number of Children: N/A  . Years of Education: N/A   Occupational History  . Not on file.   Social History Main Topics  . Smoking status: Never Smoker   . Smokeless tobacco: Not on file  . Alcohol Use: No  . Drug Use: No  . Sexually Active:    Other Topics Concern  . Not on file   Social History Narrative  . No narrative on file    Review of Systems: See HPI, otherwise negative ROS   Physical Exam: BP 198/108  Pulse 82  Temp(Src) 98 F (36.7 C) (Oral)  Resp 18  Ht 5' 6.5" (1.689 m)  Wt 127 lb (57.607 kg)  BMI 20.19 kg/m2  SpO2 98% General:   Alert,  pleasant and cooperative in NAD Head:  Normocephalic and atraumatic. Neck:  Supple;  Lungs:  Clear throughout to auscultation.    Heart:  Regular rate and rhythm. Abdomen:  Soft, nontender and nondistended. Normal bowel sounds, without guarding, and without rebound.   Neurologic:  Alert and  oriented x4;  grossly normal neurologically.  Impression/Plan:     AVERAGE RISK  PLAN: TCS TODAY

## 2012-04-23 ENCOUNTER — Encounter (HOSPITAL_COMMUNITY): Payer: Self-pay | Admitting: Gastroenterology

## 2012-04-27 ENCOUNTER — Telehealth: Payer: Self-pay | Admitting: Gastroenterology

## 2012-04-27 NOTE — Telephone Encounter (Signed)
Pt aware of results 

## 2012-04-27 NOTE — Telephone Encounter (Signed)
Patient is asking for Procedure Results please advise?

## 2012-04-27 NOTE — Telephone Encounter (Signed)
Please call pt. She had a simple adenoma removed from her colon. FOLLOW A High fiber diet. TCS in 10 years IF THE BENEFITS OUTWEIGH THE RISKS. 

## 2012-07-27 ENCOUNTER — Other Ambulatory Visit: Payer: Self-pay | Admitting: Family Medicine

## 2012-07-27 DIAGNOSIS — Z139 Encounter for screening, unspecified: Secondary | ICD-10-CM

## 2012-08-19 ENCOUNTER — Ambulatory Visit (HOSPITAL_COMMUNITY)
Admission: RE | Admit: 2012-08-19 | Discharge: 2012-08-19 | Disposition: A | Discharge status: Home / Self Care | Payer: Medicare Other | Source: Ambulatory Visit | Attending: Family Medicine | Admitting: Family Medicine

## 2012-08-19 DIAGNOSIS — Z1231 Encounter for screening mammogram for malignant neoplasm of breast: Secondary | ICD-10-CM | POA: Insufficient documentation

## 2012-08-19 DIAGNOSIS — Z139 Encounter for screening, unspecified: Secondary | ICD-10-CM

## 2013-03-16 ENCOUNTER — Encounter: Payer: Self-pay | Admitting: Family Medicine

## 2013-03-17 ENCOUNTER — Ambulatory Visit (INDEPENDENT_AMBULATORY_CARE_PROVIDER_SITE_OTHER): Payer: Medicare Other | Admitting: Family Medicine

## 2013-03-17 ENCOUNTER — Encounter: Payer: Self-pay | Admitting: Family Medicine

## 2013-03-17 VITALS — BP 138/78 | HR 98 | Temp 98.5°F | Resp 22 | Wt 117.0 lb

## 2013-03-17 DIAGNOSIS — N390 Urinary tract infection, site not specified: Secondary | ICD-10-CM

## 2013-03-17 DIAGNOSIS — J441 Chronic obstructive pulmonary disease with (acute) exacerbation: Secondary | ICD-10-CM

## 2013-03-17 DIAGNOSIS — R300 Dysuria: Secondary | ICD-10-CM

## 2013-03-17 LAB — URINALYSIS, MICROSCOPIC ONLY
Casts: NONE SEEN
Crystals: NONE SEEN

## 2013-03-17 LAB — URINALYSIS, ROUTINE W REFLEX MICROSCOPIC
Glucose, UA: NEGATIVE mg/dL
Specific Gravity, Urine: 1.015 (ref 1.005–1.030)
pH: 7 (ref 5.0–8.0)

## 2013-03-17 MED ORDER — LEVOFLOXACIN 500 MG PO TABS: 500.0000 mg | ORAL_TABLET | Freq: Every day | ORAL | Status: DC

## 2013-03-17 NOTE — Progress Notes (Signed)
Subjective:     Patient ID: [name], female   DOB: Jun 19, 1941, 72 y.o.   MRN: [id number]  HPI  Patient reports one week of coughing productive of yellow sputum, increasing shortness of breath, wheezing, pleurisy. She has a history of severe COPD.  She also reports 4 days of increasing dysuria, urinary retention, low back pain, pelvic pressure. Past Medical History  Diagnosis Date  . Osteoporosis   . Hyperlipidemia   . COPD (chronic obstructive pulmonary disease)   . Chronic UTI   . BV (bacterial vaginosis)   . FH: cataracts    Current Outpatient Prescriptions on File Prior to Visit  Medication Sig Dispense Refill  . Calcium-Magnesium-Vitamin D 500-500-100 LIQD Take 1,000 mg by mouth every morning. Take 5 milliliters  By mouth once daily      . Fluticasone-Salmeterol (ADVAIR DISKUS) 250-50 MCG/DOSE AEPB Inhale 1 puff into the lungs daily.       Marland Kitchen ipratropium (ATROVENT) 0.02 % nebulizer solution Take 500 mcg by nebulization 2 (two) times daily as needed. For shortness of breath      . Multiple Vitamin (MULITIVITAMIN) LIQD Take 5 mLs by mouth every morning.      . naphazoline-pheniramine (EYE ALLERGY RELIEF) 0.025-0.3 % ophthalmic solution 1 drop 4 (four) times daily as needed.      . zolpidem (AMBIEN) 10 MG tablet Take 10 mg by mouth at bedtime.        No current facility-administered medications on file prior to visit.    Review of Systems Review of systems is otherwise negative    Objective:   Physical Exam  Constitutional: She appears well-developed and well-nourished.  Eyes: Conjunctivae are normal. Pupils are equal, round, and reactive to light.  Neck: Neck supple. No JVD present. No thyromegaly present.  Cardiovascular: Normal rate and normal heart sounds.   Pulmonary/Chest: She has decreased breath sounds in the right upper field, the right middle field, the right lower field, the left upper field, the left middle field and the left lower field. She has wheezes.   Abdominal: Soft. Bowel sounds are normal.  Lymphadenopathy:    She has no cervical adenopathy.       Assessment:     Dysuria - Plan: Urinalysis, Routine w reflex microscopic  Bronchitis, chronic obstructive, with exacerbation  UTI (urinary tract infection)      Plan:     Levaquin 500 mg by mouth daily for 7 days.  Return immediately if worsening or in one week if no better.

## 2013-03-29 ENCOUNTER — Telehealth: Payer: Self-pay | Admitting: Family Medicine

## 2013-03-29 NOTE — Telephone Encounter (Signed)
I don't think another dose will do anything more, she may need prednisone, can she come in tomorrow morning

## 2013-03-29 NOTE — Telephone Encounter (Signed)
Pt still has chest congestion and cough and wants to know if you could give her another dose of avelox

## 2013-04-01 NOTE — Telephone Encounter (Signed)
Pt is aware and will make appt if not better

## 2013-04-04 ENCOUNTER — Ambulatory Visit (INDEPENDENT_AMBULATORY_CARE_PROVIDER_SITE_OTHER): Payer: Medicare Other | Admitting: Family Medicine

## 2013-04-04 ENCOUNTER — Encounter: Payer: Self-pay | Admitting: Family Medicine

## 2013-04-04 VITALS — BP 140/76 | HR 92 | Temp 98.2°F | Resp 20 | Wt 116.0 lb

## 2013-04-04 DIAGNOSIS — J441 Chronic obstructive pulmonary disease with (acute) exacerbation: Secondary | ICD-10-CM

## 2013-04-04 MED ORDER — PREDNISONE 20 MG PO TABS: ORAL_TABLET | ORAL | Status: DC

## 2013-04-04 MED ORDER — MOXIFLOXACIN HCL 400 MG PO TABS: 400.0000 mg | ORAL_TABLET | Freq: Every day | ORAL | Status: DC

## 2013-04-04 MED ORDER — ALBUTEROL SULFATE HFA 108 (90 BASE) MCG/ACT IN AERS: 2.0000 | INHALATION_SPRAY | Freq: Four times a day (QID) | RESPIRATORY_TRACT | Status: DC | PRN

## 2013-04-04 NOTE — Progress Notes (Signed)
Subjective:     Patient ID: [name], female   DOB: 1941-05-15, 72 y.o.   MRN: [id number]  Cough   03/17/13 Patient reports one week of coughing productive of yellow sputum, increasing shortness of breath, wheezing, pleurisy. She has a history of severe COPD.     Levaquin 500 mg by mouth daily for 7 days.    04/04/13  patient's UTI symptoms resolved completely. However her cough and chest congestion is now worse. She is wheezing significantly. She reports shortness of breath. She continues to have daily cough that is productive of white sputum but this is her baseline. She denies any rhinorrhea, sore throat, otalgia, nausea, vomiting, or diarrhea. She denies any fever. She denies any chest pain.  Past Medical History  Diagnosis Date  . Osteoporosis   . Hyperlipidemia   . COPD (chronic obstructive pulmonary disease)   . Chronic UTI   . BV (bacterial vaginosis)   . FH: cataracts    Current Outpatient Prescriptions on File Prior to Visit  Medication Sig Dispense Refill  . Calcium-Magnesium-Vitamin D 500-500-100 LIQD Take 1,000 mg by mouth every morning. Take 5 milliliters  By mouth once daily      . Fluticasone-Salmeterol (ADVAIR DISKUS) 250-50 MCG/DOSE AEPB Inhale 1 puff into the lungs daily.       Marland Kitchen ipratropium (ATROVENT) 0.02 % nebulizer solution Take 500 mcg by nebulization 2 (two) times daily as needed. For shortness of breath      . Multiple Vitamin (MULITIVITAMIN) LIQD Take 5 mLs by mouth every morning.      . naphazoline-pheniramine (EYE ALLERGY RELIEF) 0.025-0.3 % ophthalmic solution 1 drop 4 (four) times daily as needed.      . zolpidem (AMBIEN) 10 MG tablet Take 10 mg by mouth at bedtime.        No current facility-administered medications on file prior to visit.    Review of Systems  Respiratory: Positive for cough.    Review of systems is otherwise negative    Objective:   Physical Exam  Constitutional: She appears well-developed and well-nourished.  Eyes:  Conjunctivae are normal. Pupils are equal, round, and reactive to light.  Neck: Neck supple. No JVD present. No thyromegaly present.  Cardiovascular: Normal rate and normal heart sounds.   Pulmonary/Chest: She has decreased breath sounds in the right upper field, the right middle field, the right lower field, the left upper field, the left middle field and the left lower field. She has wheezes.  Abdominal: Soft. Bowel sounds are normal.  Lymphadenopathy:    She has no cervical adenopathy.       Assessment:     Bronchitis, chronic obstructive, with exacerbation   PLAN: she is to take prednisone 20 mg tablets. She takes 3 tablets on days 1 through 3, 2 tablets on days 4 through 6, one tablet on days 7 through 9.  Recommended she use Proventil meter dose inhaler 2 puff inhaled every 6 hours as needed for wheezing. Gave the patient prescription for Avelox 400 mg poqday for 7 days but advised her not to fill it unless she develops a fever, symptoms worsen, or cough becomes productive of green sputum.

## 2013-05-26 ENCOUNTER — Telehealth: Payer: Self-pay | Admitting: Family Medicine

## 2013-05-26 MED ORDER — ZOLPIDEM TARTRATE 10 MG PO TABS: 10.0000 mg | ORAL_TABLET | Freq: Every day | ORAL | Status: DC

## 2013-05-26 NOTE — Telephone Encounter (Signed)
Ok to refill 

## 2013-05-26 NOTE — Telephone Encounter (Signed)
?   OK to Refill  

## 2013-05-26 NOTE — Telephone Encounter (Signed)
.  [facility]

## 2013-07-20 ENCOUNTER — Other Ambulatory Visit: Payer: Self-pay | Admitting: Family Medicine

## 2013-07-20 DIAGNOSIS — Z139 Encounter for screening, unspecified: Secondary | ICD-10-CM

## 2013-08-09 ENCOUNTER — Other Ambulatory Visit: Payer: Self-pay | Admitting: Family Medicine

## 2013-08-09 DIAGNOSIS — Z79899 Other long term (current) drug therapy: Secondary | ICD-10-CM

## 2013-08-09 DIAGNOSIS — E785 Hyperlipidemia, unspecified: Secondary | ICD-10-CM

## 2013-08-23 ENCOUNTER — Ambulatory Visit (HOSPITAL_COMMUNITY)
Admission: RE | Admit: 2013-08-23 | Discharge: 2013-08-23 | Disposition: A | Discharge status: Home / Self Care | Payer: Medicare Other | Source: Ambulatory Visit | Attending: Family Medicine | Admitting: Family Medicine

## 2013-08-23 DIAGNOSIS — Z1231 Encounter for screening mammogram for malignant neoplasm of breast: Secondary | ICD-10-CM | POA: Insufficient documentation

## 2013-08-23 DIAGNOSIS — Z139 Encounter for screening, unspecified: Secondary | ICD-10-CM

## 2013-09-12 ENCOUNTER — Other Ambulatory Visit (INDEPENDENT_AMBULATORY_CARE_PROVIDER_SITE_OTHER): Payer: Medicare Other

## 2013-09-12 DIAGNOSIS — E785 Hyperlipidemia, unspecified: Secondary | ICD-10-CM

## 2013-09-12 DIAGNOSIS — Z23 Encounter for immunization: Secondary | ICD-10-CM

## 2013-09-12 DIAGNOSIS — Z79899 Other long term (current) drug therapy: Secondary | ICD-10-CM

## 2013-09-12 LAB — CBC WITH DIFFERENTIAL/PLATELET
Basophils Absolute: 0.2 10*3/uL — ABNORMAL HIGH (ref 0.0–0.1)
Eosinophils Absolute: 0.4 10*3/uL (ref 0.0–0.7)
Eosinophils Relative: 4 % (ref 0–5)
MCH: 30.6 pg (ref 26.0–34.0)
MCHC: 33.6 g/dL (ref 30.0–36.0)
MCV: 90.9 fL (ref 78.0–100.0)
Monocytes Absolute: 0.6 10*3/uL (ref 0.1–1.0)
Platelets: 462 10*3/uL — ABNORMAL HIGH (ref 150–400)
RDW: 15.5 % (ref 11.5–15.5)

## 2013-09-12 LAB — COMPREHENSIVE METABOLIC PANEL
ALT: 23 U/L (ref 0–35)
AST: 33 U/L (ref 0–37)
BUN: 14 mg/dL (ref 6–23)
Creat: 0.57 mg/dL (ref 0.50–1.10)
Total Bilirubin: 0.6 mg/dL (ref 0.3–1.2)

## 2013-09-12 LAB — LIPID PANEL
Cholesterol: 273 mg/dL — ABNORMAL HIGH (ref 0–200)
HDL: 70 mg/dL (ref >39)
Total CHOL/HDL Ratio: 3.9 Ratio
VLDL: 22 mg/dL (ref 0–40)

## 2013-09-22 ENCOUNTER — Ambulatory Visit (INDEPENDENT_AMBULATORY_CARE_PROVIDER_SITE_OTHER): Payer: Medicare Other | Admitting: Family Medicine

## 2013-09-22 ENCOUNTER — Telehealth: Payer: Self-pay | Admitting: Family Medicine

## 2013-09-22 ENCOUNTER — Encounter: Payer: Self-pay | Admitting: Family Medicine

## 2013-09-22 VITALS — BP 130/80 | HR 90 | Temp 97.3°F | Resp 20 | Ht 62.0 in | Wt 118.0 lb

## 2013-09-22 DIAGNOSIS — Z0000 Encounter for general adult medical examination without abnormal findings: Secondary | ICD-10-CM

## 2013-09-22 DIAGNOSIS — Z79890 Hormone replacement therapy (postmenopausal): Secondary | ICD-10-CM

## 2013-09-22 MED ORDER — NORETHINDRONE-ETH ESTRADIOL 1-5 MG-MCG PO TABS: 1.0000 | ORAL_TABLET | Freq: Every day | ORAL | Status: DC

## 2013-09-22 NOTE — Progress Notes (Signed)
Subjective:    Patient ID: [name], female    DOB: [birth year], 72 y.o.   MRN: [id number]  HPI Patient is here today for complete physical exam. She continues to complain of vaginal dryness. She tried osphena but quit the medicine due to wheezing.  Otherwise she is doing well. She has a history of significant severe osteoporosis. She fell numerous bisphosphonates, Evista, prolia due to side effects. She is willing to try hormone replacement therapy given the severity of her osteoporosis and her vaginal dryness.  Her mammogram was this year and is up to date. Her colonoscopy was in 2011 is up to date. Pneumovax t is up to date. Her flu shot is up to date. She is due for the Zostavax.   Past Medical History  Diagnosis Date  . Osteoporosis   . Hyperlipidemia   . COPD (chronic obstructive pulmonary disease)   . Chronic UTI   . BV (bacterial vaginosis)   . FH: cataracts    Past Surgical History  Procedure Laterality Date  . Fracture surgery      rt left knee cap  . Colonoscopy  04/19/2012    Procedure: COLONOSCOPY;  Surgeon: West Bali, MD;  Location: [facility];  Service: Endoscopy;  Laterality: N/A;  11:30 AM   Current Outpatient Prescriptions on File Prior to Visit  Medication Sig Dispense Refill  . albuterol (PROVENTIL HFA;VENTOLIN HFA) 108 (90 BASE) MCG/ACT inhaler Inhale 2 puffs into the lungs every 6 (six) hours as needed for wheezing.  1 Inhaler  0  . Fluticasone-Salmeterol (ADVAIR DISKUS) 250-50 MCG/DOSE AEPB Inhale 1 puff into the lungs daily.       Marland Kitchen ipratropium (ATROVENT) 0.02 % nebulizer solution Take 500 mcg by nebulization 2 (two) times daily as needed. For shortness of breath      . moxifloxacin (AVELOX) 400 MG tablet Take 1 tablet (400 mg total) by mouth daily.  7 tablet  0  . Multiple Vitamin (MULITIVITAMIN) LIQD Take 5 mLs by mouth every morning.      . naphazoline-pheniramine (EYE ALLERGY RELIEF) 0.025-0.3 % ophthalmic solution 1 drop 4 (four) times daily as  needed.      . zolpidem (AMBIEN) 10 MG tablet Take 1 tablet (10 mg total) by mouth at bedtime.  90 tablet  0  . Calcium-Magnesium-Vitamin D 500-500-100 LIQD Take 1,000 mg by mouth every morning. Take 5 milliliters  By mouth once daily      . predniSONE (DELTASONE) 20 MG tablet 3 tabs on days 1-3, 2 tabs on days 4-6, 1 tab on days 7-9  18 tablet  0   No current facility-administered medications on file prior to visit.   Allergies  Allergen Reactions  . Naproxen Sodium Nausea And Vomiting  . Symbicort [Budesonide-Formoterol Fumarate] Shortness Of Breath    Breathing difficulty  . Alendronate Sodium Other (See Comments)    Kidney failure  . Boniva [Ibandronate Sodium] Other (See Comments)    Kidney failure   . Risedronate Sodium   . Zetia [Ezetimibe] Other (See Comments)    Muscle pain   History   Social History  . Marital Status: Married    Spouse Name: N/A    Number of Children: N/A  . Years of Education: N/A   Occupational History  . Not on file.   Social History Main Topics  . Smoking status: Never Smoker   . Smokeless tobacco: Never Used  . Alcohol Use: No  . Drug Use: No  . Sexual  Activity: Not on file     Comment: married, husband smokes.   Other Topics Concern  . Not on file   Social History Narrative  . No narrative on file   History reviewed. No pertinent family history. Parents are deceased. Family history is noncontributory   Review of Systems  All other systems reviewed and are negative.       Objective:   Physical Exam  Vitals reviewed. Constitutional: She is oriented to person, place, and time. She appears well-nourished. No distress.  HENT:  Head: Normocephalic and atraumatic.  Right Ear: External ear normal.  Left Ear: External ear normal.  Nose: Nose normal.  Mouth/Throat: Oropharynx is clear and moist. No oropharyngeal exudate.  Eyes: Conjunctivae are normal. Pupils are equal, round, and reactive to light. Right eye exhibits no  discharge. Left eye exhibits no discharge. No scleral icterus.  Neck: Normal range of motion. Neck supple. No JVD present. No tracheal deviation present. No thyromegaly present.  Cardiovascular: Normal rate, regular rhythm, normal heart sounds and intact distal pulses.  Exam reveals no gallop and no friction rub.   No murmur heard. Pulmonary/Chest: Effort normal and breath sounds normal. No stridor. No respiratory distress. She has no wheezes. She has no rales. She exhibits no tenderness.  Abdominal: Soft. Bowel sounds are normal. She exhibits no distension and no mass. There is no tenderness. There is no rebound and no guarding.  Musculoskeletal: Normal range of motion. She exhibits no edema and no tenderness.  Lymphadenopathy:    She has no cervical adenopathy.  Neurological: She is alert and oriented to person, place, and time. She has normal reflexes. She displays normal reflexes. No cranial nerve deficit. She exhibits normal muscle tone. Coordination normal.  Skin: Skin is warm. No rash noted. She is not diaphoretic. No erythema. No pallor.  Psychiatric: She has a normal mood and affect. Her behavior is normal. Judgment and thought content normal.  patient declines pelvic exam today and refuses Pap smear.        Assessment & Plan:  1. Postmenopausal HRT (hormone replacement therapy)  - norethindrone-ethinyl estradiol (JINTELI) 1-5 MG-MCG TABS; Take 1 tablet by mouth daily.  Dispense: 28 tablet; Refill: 5  2. Routine general medical examination at a health care facility I recommended hormone replacement therapy for osteoporosis and vaginal dryness. I prescribed Prempro but her insurance would not pay for the medication. Therefore I prescribed the available alternative Jinteli 1/5 1/2 tab  by mouth daily.  I reviewed her most recent fasting labs: Lab on 09/12/2013  Component Date Value Range Status  . WBC 09/12/2013 10.2  4.0 - 10.5 K/uL Final  . RBC 09/12/2013 5.17* 3.87 - 5.11 MIL/uL  Final  . Hemoglobin 09/12/2013 15.8* 12.0 - 15.0 g/dL Final  . HCT 40/98/1191 47.0* 36.0 - 46.0 % Final  . MCV 09/12/2013 90.9  78.0 - 100.0 fL Final  . MCH 09/12/2013 30.6  26.0 - 34.0 pg Final  . MCHC 09/12/2013 33.6  30.0 - 36.0 g/dL Final  . RDW 47/82/9562 15.5  11.5 - 15.5 % Final  . Platelets 09/12/2013 462* 150 - 400 K/uL Final  . Neutrophils Relative % 09/12/2013 58  43 - 77 % Final  . Neutro Abs 09/12/2013 6.0  1.7 - 7.7 K/uL Final  . Lymphocytes Relative 09/12/2013 30  12 - 46 % Final  . Lymphs Abs 09/12/2013 3.1  0.7 - 4.0 K/uL Final  . Monocytes Relative 09/12/2013 6  3 - 12 % Final  .  Monocytes Absolute 09/12/2013 0.6  0.1 - 1.0 K/uL Final  . Eosinophils Relative 09/12/2013 4  0 - 5 % Final  . Eosinophils Absolute 09/12/2013 0.4  0.0 - 0.7 K/uL Final  . Basophils Relative 09/12/2013 2* 0 - 1 % Final  . Basophils Absolute 09/12/2013 0.2* 0.0 - 0.1 K/uL Final  . Smear Review 09/12/2013 Criteria for review not met   Final  . Sodium 09/12/2013 138  135 - 145 mEq/L Final  . Potassium 09/12/2013 4.8  3.5 - 5.3 mEq/L Final  . Chloride 09/12/2013 99  96 - 112 mEq/L Final  . CO2 09/12/2013 30  19 - 32 mEq/L Final  . Glucose, Bld 09/12/2013 91  70 - 99 mg/dL Final  . BUN 16/09/9603 14  6 - 23 mg/dL Final  . Creat 54/08/8118 0.57  0.50 - 1.10 mg/dL Final  . Total Bilirubin 09/12/2013 0.6  0.3 - 1.2 mg/dL Final  . Alkaline Phosphatase 09/12/2013 139* 39 - 117 U/L Final  . AST 09/12/2013 33  0 - 37 U/L Final  . ALT 09/12/2013 23  0 - 35 U/L Final  . Total Protein 09/12/2013 7.9  6.0 - 8.3 g/dL Final  . Albumin 14/78/2956 4.6  3.5 - 5.2 g/dL Final  . Calcium 21/30/8657 10.4  8.4 - 10.5 mg/dL Final  . Cholesterol 84/69/6295 273* 0 - 200 mg/dL Final   Comment: ATP III Classification:                                < 200        mg/dL        Desirable                               200 - 239     mg/dL        Borderline High                               >= 240        mg/dL        High                              . Triglycerides 09/12/2013 108  <150 mg/dL Final  . HDL 28/41/3244 70  >39 mg/dL Final  . Total CHOL/HDL Ratio 09/12/2013 3.9   Final  . VLDL 09/12/2013 22  0 - 40 mg/dL Final  . LDL Cholesterol 09/12/2013 181* 0 - 99 mg/dL Final   Comment:                            Total Cholesterol/HDL Ratio:CHD Risk                                                 Coronary Heart Disease Risk Table  Men       Women                                   1/2 Average Risk              3.4        3.3                                       Average Risk              5.0        4.4                                    2X Average Risk              9.6        7.1                                    3X Average Risk             23.4       11.0                          Use the calculated Patient Ratio above and the CHD Risk table                           to determine the patient's CHD Risk.                          ATP III Classification (LDL):                                < 100        mg/dL         Optimal                               100 - 129     mg/dL         Near or Above Optimal                               130 - 159     mg/dL         Borderline High                               160 - 189     mg/dL         High                                > 190        mg/dL         Very High  Patient's LDL cholesterol is significantly elevated at 181. She refuses statin therapy and she has had muscle aches to simvastatin, pravastatin, andzetia.  She elects to try dietary changes and recheck labs in 3 months. She will also call her insurance and inquire about the price of the Zostavax. If it is affordable she will proceed with vaccine.

## 2013-09-23 NOTE — Telephone Encounter (Signed)
Error

## 2013-09-26 ENCOUNTER — Telehealth: Payer: Self-pay | Admitting: Family Medicine

## 2013-09-26 NOTE — Telephone Encounter (Signed)
Would like to know what the name of the HRT medicine is that you discussed with the patient ? Please call to let the patient know.

## 2013-09-27 NOTE — Telephone Encounter (Signed)
Left message with pt to return my call °

## 2013-09-28 NOTE — Telephone Encounter (Signed)
Pt called

## 2013-10-14 ENCOUNTER — Telehealth: Payer: Self-pay | Admitting: Family Medicine

## 2013-10-14 MED ORDER — TRAZODONE HCL 50 MG PO TABS: 50.0000 mg | ORAL_TABLET | Freq: Every day | ORAL | Status: DC

## 2013-10-14 MED ORDER — IPRATROPIUM BROMIDE 0.02 % IN SOLN: 500.0000 ug | Freq: Two times a day (BID) | RESPIRATORY_TRACT | Status: DC | PRN

## 2013-10-14 NOTE — Telephone Encounter (Signed)
Okay  She can stay with atrovent.

## 2013-10-14 NOTE — Telephone Encounter (Signed)
Per [facility] ok to to Trazodone 50mg  qhs. rx sent to pharmacy.  Pt states that her insurance will no longer cover Ambien but will cover trazodone. Can we call that in for her as well.

## 2013-10-14 NOTE — Telephone Encounter (Signed)
Patient wants to stay with the ATROVENT . It works better for her.  She tried the sample you gave her and she doesn't like.   [facility] [place].

## 2013-10-20 ENCOUNTER — Telehealth: Payer: Self-pay | Admitting: Family Medicine

## 2013-10-20 NOTE — Telephone Encounter (Signed)
Patient needs her zolpidem refilled for sleep.

## 2013-10-21 NOTE — Telephone Encounter (Signed)
Pt called stating that the trazodone was not helping her sleep at all and wanted to know if we could refill her Ambien that her ins will cover until next year. I refilled her medication per [facility] and she made a follow up appt.

## 2013-10-28 NOTE — Telephone Encounter (Signed)
Patient needs her ATROVENT refilled   [facility]  [address] [place]

## 2013-10-28 NOTE — Telephone Encounter (Signed)
[facility]  ????Refill for ned or inhaler???

## 2013-10-31 ENCOUNTER — Telehealth: Payer: Self-pay | Admitting: Family Medicine

## 2013-10-31 MED ORDER — IPRATROPIUM BROMIDE HFA 17 MCG/ACT IN AERS: 2.0000 | INHALATION_SPRAY | Freq: Four times a day (QID) | RESPIRATORY_TRACT | Status: DC

## 2013-10-31 NOTE — Telephone Encounter (Signed)
rx sent to pharm

## 2013-10-31 NOTE — Telephone Encounter (Signed)
rx sent for inhaler

## 2013-10-31 NOTE — Telephone Encounter (Signed)
[name] is calling because she uses atrovent  not a nebulaizer and she ran out about 3 weeks ago and this has lasted her for about 2 years. It is the best inhaler that she has ever used Pharmacy [facility] [address] in [place] Call back [phone]

## 2013-11-24 ENCOUNTER — Other Ambulatory Visit: Payer: Self-pay | Admitting: Family Medicine

## 2013-12-21 ENCOUNTER — Encounter: Payer: Self-pay | Admitting: Family Medicine

## 2013-12-21 ENCOUNTER — Ambulatory Visit (INDEPENDENT_AMBULATORY_CARE_PROVIDER_SITE_OTHER): Payer: Medicare Other | Admitting: Family Medicine

## 2013-12-21 VITALS — BP 110/60 | HR 115 | Temp 98.8°F | Resp 18 | Ht 63.0 in | Wt 116.0 lb

## 2013-12-21 DIAGNOSIS — J441 Chronic obstructive pulmonary disease with (acute) exacerbation: Secondary | ICD-10-CM | POA: Insufficient documentation

## 2013-12-21 DIAGNOSIS — R000 Tachycardia, unspecified: Secondary | ICD-10-CM

## 2013-12-21 MED ORDER — MOXIFLOXACIN HCL 400 MG PO TABS: 400.0000 mg | ORAL_TABLET | Freq: Every day | ORAL | Status: DC

## 2013-12-21 MED ORDER — IPRATROPIUM BROMIDE 0.02 % IN SOLN: 500.0000 ug | Freq: Two times a day (BID) | RESPIRATORY_TRACT | Status: DC | PRN

## 2013-12-21 MED ORDER — METHYLPREDNISOLONE ACETATE 40 MG/ML IJ SUSP
40.0000 mg | Freq: Once | INTRAMUSCULAR | Status: AC
  Administered 2013-12-21: 40 mg via INTRAMUSCULAR

## 2013-12-21 MED ORDER — PREDNISONE 20 MG PO TABS: ORAL_TABLET | ORAL | Status: DC

## 2013-12-21 NOTE — Patient Instructions (Addendum)
Use nebulizer as twice a day,- you do not need to use the atrovent inhaler when you use the nebulizer Start prednisone tomorrow Start antibiotics today Continue mucinex Get nebulizer machine from [place] [facility] Friday for a recheck  GO TO ER IF YOU GET WORSE

## 2013-12-21 NOTE — Progress Notes (Signed)
   Subjective:    Patient ID: [name], female    DOB: 15-Aug-1941, 73 y.o.   MRN: [id number]  HPI Patient here with concern for bronchitis. She has known COPD and is currently on Atrovent as well as Advair. She states this feels like her previous bronchitis episodes. She began coughing and wheezing with some shortness of breath on Monday and it has progressed. She does not have any home oxygen. She's not had any fever. She is a nonsmoker. Denies any chest pain. She did use her Atrovent. Of note her records showed Atrovent nebulized solution but she has never used this.   Review of Systems  GEN- denies fatigue, fever, weight loss,weakness, recent illness HEENT- denies eye drainage, change in vision, nasal discharge, CVS- denies chest pain, palpitations RESP- + SOB, +cough, +wheeze ABD- denies abd pain, N/V, change in stools Neuro- denies headache, dizziness, syncope, seizure activity      Objective:   Physical Exam  GEN- NAD, alert and oriented x3 HEENT- PERRL, EOMI, non injected sclera, pink conjunctiva, MMM, oropharynx clear ,  Neck- Supple, no LAD CVS- tachycardia HR 110, no murmur RESP- bilateral wheeze, decreased air movement bases, mild rhonchi, speaks in full sentences but is short winded, mild retractions EXT- No edema Pulses- Radial 2+  S/p neb, breathing more comfortable, improved air movement, oxygen sat 94%        Assessment & Plan:

## 2013-12-21 NOTE — Assessment & Plan Note (Signed)
She appears to have chronic resting tachycardia. Reviewed her past 3-4 evaluations and she's always had tachycardia. It appears this has been worked up in the past this patient states that it is due to nervousness when she comes in nothing else has been found to be wrong

## 2013-12-21 NOTE — Assessment & Plan Note (Signed)
Status post Atrovent in the office her air movement improved. She's been given Depo-Medrol she will then proceed with a prednisone taper. Avelox will also be started she is high risk for decompensation. She will continue Mucinex and I will have her followup on Friday for recheck. Of note she was given a nebulizer as well as Atrovent solution

## 2013-12-23 ENCOUNTER — Ambulatory Visit (INDEPENDENT_AMBULATORY_CARE_PROVIDER_SITE_OTHER): Payer: Medicare Other | Admitting: Family Medicine

## 2013-12-23 ENCOUNTER — Encounter: Payer: Self-pay | Admitting: Family Medicine

## 2013-12-23 VITALS — BP 150/80 | HR 80 | Temp 97.6°F | Resp 18 | Wt 118.0 lb

## 2013-12-23 DIAGNOSIS — J441 Chronic obstructive pulmonary disease with (acute) exacerbation: Secondary | ICD-10-CM

## 2013-12-23 NOTE — Progress Notes (Signed)
   Subjective:    Patient ID: [name], female    DOB: 1941-06-19, 73 y.o.   MRN: [id number]  HPI  Pt here for recheck on COPD exacerbation, did not start meds until yesterday, she brought her nebulizer with her today to learn how to use this. States she feels better and feels less SOB. Continues to have cough and wheezing   Review of Systems - per above   GEN- denies fatigue, fever, weight loss,weakness, recent illness HEENT- denies eye drainage, change in vision, nasal discharge, CVS- denies chest pain, palpitations RESP- + SOB, +cough, +wheeze      Objective:   Physical Exam  GEN- NAD, alert and oriented x3 HEENT- PERRL, EOMI, non injected sclera, pink conjunctiva, MMM, oropharynx clear ,  CVS- RRR , no murmur RESP- bilateral wheeze,good air movement bases, mild rhonchi EXT- No edema Pulses- Radial 2+      Assessment & Plan:

## 2013-12-23 NOTE — Patient Instructions (Signed)
Continue current medications Use nebulizer  F/u with Dr. Caren Griffins as previous

## 2013-12-25 NOTE — Assessment & Plan Note (Signed)
Oxygen sats are stable, I think she is safe through the weekend to continue meds Discussed red flags Start nebulizer treatments today Continue steroids, antibiotics

## 2014-01-17 ENCOUNTER — Other Ambulatory Visit (HOSPITAL_COMMUNITY): Payer: Self-pay | Admitting: Preventative Medicine

## 2014-01-17 DIAGNOSIS — R911 Solitary pulmonary nodule: Secondary | ICD-10-CM

## 2014-01-19 ENCOUNTER — Encounter (HOSPITAL_COMMUNITY): Payer: Self-pay

## 2014-01-19 ENCOUNTER — Ambulatory Visit (HOSPITAL_COMMUNITY)
Admission: RE | Admit: 2014-01-19 | Discharge: 2014-01-19 | Disposition: A | Discharge status: Home / Self Care | Payer: Medicare Other | Source: Ambulatory Visit | Attending: Preventative Medicine | Admitting: Preventative Medicine

## 2014-01-19 DIAGNOSIS — R911 Solitary pulmonary nodule: Secondary | ICD-10-CM | POA: Insufficient documentation

## 2014-01-19 DIAGNOSIS — Z09 Encounter for follow-up examination after completed treatment for conditions other than malignant neoplasm: Secondary | ICD-10-CM | POA: Insufficient documentation

## 2014-01-19 DIAGNOSIS — J479 Bronchiectasis, uncomplicated: Secondary | ICD-10-CM | POA: Insufficient documentation

## 2014-01-19 DIAGNOSIS — J438 Other emphysema: Secondary | ICD-10-CM | POA: Insufficient documentation

## 2014-01-19 LAB — POCT I-STAT CREATININE: Creatinine, Ser: 0.7 mg/dL (ref 0.50–1.10)

## 2014-01-19 MED ORDER — IOHEXOL 300 MG/ML  SOLN
80.0000 mL | Freq: Once | INTRAMUSCULAR | Status: AC | PRN
  Administered 2014-01-19: 80 mL via INTRAVENOUS

## 2014-01-25 ENCOUNTER — Other Ambulatory Visit: Payer: Self-pay | Admitting: Family Medicine

## 2014-01-25 NOTE — Telephone Encounter (Signed)
?   OK to Refill  

## 2014-01-26 NOTE — Telephone Encounter (Signed)
ok 

## 2014-03-22 ENCOUNTER — Emergency Department (HOSPITAL_COMMUNITY): Payer: Medicare Other

## 2014-03-22 ENCOUNTER — Encounter (HOSPITAL_COMMUNITY): Payer: Self-pay | Admitting: Emergency Medicine

## 2014-03-22 ENCOUNTER — Inpatient Hospital Stay (HOSPITAL_COMMUNITY)
Admission: EM | Admit: 2014-03-22 | Discharge: 2014-03-25 | DRG: 193 | Disposition: A | Discharge status: Home / Self Care | Payer: Medicare Other | Attending: Family Medicine | Admitting: Family Medicine

## 2014-03-22 DIAGNOSIS — J189 Pneumonia, unspecified organism: Principal | ICD-10-CM | POA: Diagnosis present

## 2014-03-22 DIAGNOSIS — E785 Hyperlipidemia, unspecified: Secondary | ICD-10-CM | POA: Diagnosis present

## 2014-03-22 DIAGNOSIS — Z681 Body mass index (BMI) 19 or less, adult: Secondary | ICD-10-CM

## 2014-03-22 DIAGNOSIS — J449 Chronic obstructive pulmonary disease, unspecified: Secondary | ICD-10-CM | POA: Diagnosis present

## 2014-03-22 DIAGNOSIS — Z800 Family history of malignant neoplasm of digestive organs: Secondary | ICD-10-CM | POA: Diagnosis not present

## 2014-03-22 DIAGNOSIS — Z9981 Dependence on supplemental oxygen: Secondary | ICD-10-CM | POA: Diagnosis not present

## 2014-03-22 DIAGNOSIS — E43 Unspecified severe protein-calorie malnutrition: Secondary | ICD-10-CM | POA: Insufficient documentation

## 2014-03-22 DIAGNOSIS — J9601 Acute respiratory failure with hypoxia: Secondary | ICD-10-CM

## 2014-03-22 DIAGNOSIS — Z87891 Personal history of nicotine dependence: Secondary | ICD-10-CM

## 2014-03-22 DIAGNOSIS — R0602 Shortness of breath: Secondary | ICD-10-CM | POA: Diagnosis present

## 2014-03-22 DIAGNOSIS — J9600 Acute respiratory failure, unspecified whether with hypoxia or hypercapnia: Secondary | ICD-10-CM | POA: Diagnosis present

## 2014-03-22 DIAGNOSIS — J441 Chronic obstructive pulmonary disease with (acute) exacerbation: Secondary | ICD-10-CM | POA: Diagnosis present

## 2014-03-22 DIAGNOSIS — M810 Age-related osteoporosis without current pathological fracture: Secondary | ICD-10-CM | POA: Diagnosis present

## 2014-03-22 DIAGNOSIS — J479 Bronchiectasis, uncomplicated: Secondary | ICD-10-CM | POA: Diagnosis present

## 2014-03-22 LAB — HIV ANTIBODY (ROUTINE TESTING W REFLEX): HIV 1&2 Ab, 4th Generation: NONREACTIVE

## 2014-03-22 LAB — CBC WITH DIFFERENTIAL/PLATELET
BASOS ABS: 0 10*3/uL (ref 0.0–0.1)
BASOS PCT: 0 % (ref 0–1)
EOS ABS: 0 10*3/uL (ref 0.0–0.7)
Eosinophils Relative: 0 % (ref 0–5)
HEMATOCRIT: 40.9 % (ref 36.0–46.0)
Hemoglobin: 13.6 g/dL (ref 12.0–15.0)
Lymphocytes Relative: 6 % — ABNORMAL LOW (ref 12–46)
Lymphs Abs: 1.1 10*3/uL (ref 0.7–4.0)
MCH: 30.6 pg (ref 26.0–34.0)
MCHC: 33.3 g/dL (ref 30.0–36.0)
MCV: 92.1 fL (ref 78.0–100.0)
MONO ABS: 1.3 10*3/uL — AB (ref 0.1–1.0)
Monocytes Relative: 7 % (ref 3–12)
Neutro Abs: 17.3 10*3/uL — ABNORMAL HIGH (ref 1.7–7.7)
Neutrophils Relative %: 87 % — ABNORMAL HIGH (ref 43–77)
Platelets: 551 10*3/uL — ABNORMAL HIGH (ref 150–400)
RBC: 4.44 MIL/uL (ref 3.87–5.11)
RDW: 14.2 % (ref 11.5–15.5)
WBC: 19.9 10*3/uL — ABNORMAL HIGH (ref 4.0–10.5)

## 2014-03-22 LAB — BASIC METABOLIC PANEL
BUN: 21 mg/dL (ref 6–23)
CALCIUM: 9.5 mg/dL (ref 8.4–10.5)
CO2: 26 mEq/L (ref 19–32)
Chloride: 93 mEq/L — ABNORMAL LOW (ref 96–112)
Creatinine, Ser: 0.72 mg/dL (ref 0.50–1.10)
GFR calc non Af Amer: 84 mL/min — ABNORMAL LOW (ref >90)
Glucose, Bld: 202 mg/dL — ABNORMAL HIGH (ref 70–99)
Potassium: 3.9 mEq/L (ref 3.7–5.3)
Sodium: 132 mEq/L — ABNORMAL LOW (ref 137–147)

## 2014-03-22 LAB — STREP PNEUMONIAE URINARY ANTIGEN: Strep Pneumo Urinary Antigen: NEGATIVE

## 2014-03-22 MED ORDER — SODIUM CHLORIDE 0.9 % IJ SOLN
3.0000 mL | INTRAMUSCULAR | Status: DC | PRN
  Administered 2014-03-24: 3 mL via INTRAVENOUS

## 2014-03-22 MED ORDER — ONDANSETRON HCL 4 MG PO TABS: 4.0000 mg | ORAL_TABLET | Freq: Four times a day (QID) | ORAL | Status: DC | PRN

## 2014-03-22 MED ORDER — VANCOMYCIN HCL IN DEXTROSE 1-5 GM/200ML-% IV SOLN
1000.0000 mg | INTRAVENOUS | Status: DC
  Administered 2014-03-22 – 2014-03-23 (×2): 1000 mg via INTRAVENOUS
  Filled 2014-03-22 (×3): qty 200

## 2014-03-22 MED ORDER — HYDROCOD POLST-CHLORPHEN POLST 10-8 MG/5ML PO LQCR
5.0000 mL | Freq: Once | ORAL | Status: AC
  Administered 2014-03-22: 5 mL via ORAL
  Filled 2014-03-22: qty 5

## 2014-03-22 MED ORDER — CEFEPIME HCL 1 G IJ SOLR
1.0000 g | Freq: Three times a day (TID) | INTRAMUSCULAR | Status: DC
  Administered 2014-03-22 – 2014-03-25 (×10): 1 g via INTRAVENOUS
  Filled 2014-03-22 (×16): qty 1

## 2014-03-22 MED ORDER — DEXTROSE 5 % IV SOLN
500.0000 mg | INTRAVENOUS | Status: DC
  Administered 2014-03-22: 500 mg via INTRAVENOUS

## 2014-03-22 MED ORDER — IPRATROPIUM-ALBUTEROL 0.5-2.5 (3) MG/3ML IN SOLN
3.0000 mL | Freq: Once | RESPIRATORY_TRACT | Status: AC
  Administered 2014-03-22: 3 mL via RESPIRATORY_TRACT
  Filled 2014-03-22: qty 3

## 2014-03-22 MED ORDER — ZOLPIDEM TARTRATE 5 MG PO TABS
5.0000 mg | ORAL_TABLET | Freq: Every day | ORAL | Status: DC
  Administered 2014-03-22 – 2014-03-24 (×3): 5 mg via ORAL
  Filled 2014-03-22 (×3): qty 1

## 2014-03-22 MED ORDER — DEXTROSE 5 % IV SOLN
250.0000 mg | INTRAVENOUS | Status: DC
  Administered 2014-03-23 – 2014-03-24 (×2): 250 mg via INTRAVENOUS
  Filled 2014-03-22 (×5): qty 250

## 2014-03-22 MED ORDER — DEXTROSE 5 % IV SOLN
1.0000 g | Freq: Once | INTRAVENOUS | Status: AC
  Administered 2014-03-22: 1 g via INTRAVENOUS
  Filled 2014-03-22: qty 10

## 2014-03-22 MED ORDER — DEXTROSE 5 % IV SOLN: 2.0000 g | Freq: Once | INTRAVENOUS | Status: DC

## 2014-03-22 MED ORDER — LEVOFLOXACIN IN D5W 750 MG/150ML IV SOLN: 750.0000 mg | Freq: Once | INTRAVENOUS | Status: DC

## 2014-03-22 MED ORDER — SODIUM CHLORIDE 0.9 % IJ SOLN
3.0000 mL | Freq: Two times a day (BID) | INTRAMUSCULAR | Status: DC
  Administered 2014-03-22 – 2014-03-24 (×4): 3 mL via INTRAVENOUS

## 2014-03-22 MED ORDER — ALBUTEROL SULFATE (2.5 MG/3ML) 0.083% IN NEBU: 2.5000 mg | INHALATION_SOLUTION | RESPIRATORY_TRACT | Status: DC | PRN

## 2014-03-22 MED ORDER — SODIUM CHLORIDE 0.9 % IV SOLN: 250.0000 mL | INTRAVENOUS | Status: DC | PRN

## 2014-03-22 MED ORDER — ACETAMINOPHEN 325 MG PO TABS
650.0000 mg | ORAL_TABLET | Freq: Four times a day (QID) | ORAL | Status: DC | PRN
  Administered 2014-03-23 (×2): 650 mg via ORAL
  Filled 2014-03-22 (×2): qty 2

## 2014-03-22 MED ORDER — ACETAMINOPHEN 650 MG RE SUPP: 650.0000 mg | Freq: Four times a day (QID) | RECTAL | Status: DC | PRN

## 2014-03-22 MED ORDER — DEXTROSE 5 % IV SOLN: 1.0000 g | Freq: Once | INTRAVENOUS | Status: DC

## 2014-03-22 MED ORDER — ONDANSETRON HCL 4 MG/2ML IJ SOLN: 4.0000 mg | Freq: Four times a day (QID) | INTRAMUSCULAR | Status: DC | PRN

## 2014-03-22 MED ORDER — IPRATROPIUM-ALBUTEROL 0.5-2.5 (3) MG/3ML IN SOLN
3.0000 mL | Freq: Three times a day (TID) | RESPIRATORY_TRACT | Status: DC
  Administered 2014-03-22 – 2014-03-25 (×8): 3 mL via RESPIRATORY_TRACT
  Filled 2014-03-22 (×9): qty 3

## 2014-03-22 MED ORDER — PREDNISONE 10 MG PO TABS
60.0000 mg | ORAL_TABLET | Freq: Once | ORAL | Status: AC
Start: 2014-03-22 — End: 2014-03-22
  Administered 2014-03-22: 60 mg via ORAL
  Filled 2014-03-22 (×2): qty 1

## 2014-03-22 MED ORDER — AZITHROMYCIN 250 MG PO TABS: 500.0000 mg | ORAL_TABLET | Freq: Once | ORAL | Status: DC

## 2014-03-22 MED ORDER — IPRATROPIUM-ALBUTEROL 0.5-2.5 (3) MG/3ML IN SOLN
3.0000 mL | RESPIRATORY_TRACT | Status: DC
Start: 2014-03-22 — End: 2014-03-22
  Administered 2014-03-22: 3 mL via RESPIRATORY_TRACT
  Filled 2014-03-22: qty 3

## 2014-03-22 MED ORDER — ENOXAPARIN SODIUM 40 MG/0.4ML ~~LOC~~ SOLN
40.0000 mg | SUBCUTANEOUS | Status: DC
  Administered 2014-03-22 – 2014-03-25 (×4): 40 mg via SUBCUTANEOUS
  Filled 2014-03-22 (×4): qty 0.4

## 2014-03-22 NOTE — Progress Notes (Signed)
ANTIBIOTIC CONSULT NOTE - INITIAL  Pharmacy Consult for Vancomycin, Cefepime, Zithromax Indication: pneumonia  Allergies  Allergen Reactions  . Naproxen Sodium Nausea And Vomiting  . Symbicort [Budesonide-Formoterol Fumarate] Shortness Of Breath    Breathing difficulty  . Alendronate Sodium Other (See Comments)    Kidney failure  . Boniva [Ibandronate Sodium] Other (See Comments)    Kidney failure   . Risedronate Sodium   . Zetia [Ezetimibe] Other (See Comments)    Muscle pain    Patient Measurements: Height: 5\' 6"  (167.6 cm) Weight: 108 lb 0.4 oz (49 kg) IBW/kg (Calculated) : 59.3  Vital Signs: Temp: 97.7 F (36.5 C) (04/15 0933) Temp src: Oral (04/15 0933) BP: 99/80 mmHg (04/15 0933) Pulse Rate: 106 (04/15 1132) Intake/Output from previous day:   Intake/Output from this shift: Total I/O In: 243 [P.O.:240; I.V.:3] Out: 400 [Urine:400]  Labs:  Recent Labs  03/22/14 0621  WBC 19.9*  HGB 13.6  PLT 551*  CREATININE 0.72   Estimated Creatinine Clearance: 49.9 ml/min (by C-G formula based on Cr of 0.72). No results found for this basename: VANCOTROUGH, Corlis Leak, VANCORANDOM, GENTTROUGH, GENTPEAK, GENTRANDOM, TOBRATROUGH, TOBRAPEAK, TOBRARND, AMIKACINPEAK, AMIKACINTROU, AMIKACIN,  in the last 72 hours   Microbiology: Recent Results (from the past 720 hour(s))  CULTURE, BLOOD (ROUTINE X 2)     Status: None   Collection Time    03/22/14  6:56 AM      Result Value Ref Range Status   Specimen Description BLOOD RIGHT ARM   Final   Special Requests BOTTLES DRAWN AEROBIC AND ANAEROBIC 8CC   Final   Culture NO GROWTH <24 HRS   Final   Report Status PENDING   Incomplete  CULTURE, BLOOD (ROUTINE X 2)     Status: None   Collection Time    03/22/14  6:56 AM      Result Value Ref Range Status   Specimen Description BLOOD LEFT ARM   Final   Special Requests BOTTLES DRAWN AEROBIC AND ANAEROBIC 10CC   Final   Culture NO GROWTH <24 HRS   Final   Report Status PENDING    Incomplete    Medical History: Past Medical History  Diagnosis Date  . Osteoporosis   . Hyperlipidemia   . COPD (chronic obstructive pulmonary disease)   . Chronic UTI   . BV (bacterial vaginosis)   . FH: cataracts     Medications:  Scheduled:  . [START ON 03/23/2014] azithromycin  250 mg Intravenous Q24H  . ceFEPime (MAXIPIME) IV  1 g Intravenous Q8H  . enoxaparin (LOVENOX) injection  40 mg Subcutaneous Q24H  . ipratropium-albuterol  3 mL Nebulization TID  . sodium chloride  3 mL Intravenous Q12H  . vancomycin  1,000 mg Intravenous Q24H  . zolpidem  5 mg Oral QHS   Assessment: 73 yo F with hx COPD to start on empiric, broad-spectrum antibiotics for PNA.   She was been treated for worsening productive cough & dyspnea with course of oral antibiotics/steroids twice since Jan 2015.   CXR + PNA. WBC is elevated, but patient remains afebrile.   Renal function is at patient's baseline.   Vancomycin 4/15>> Cefepime 4/15>> Zithromax 4/15>>  Goal of Therapy:  Vancomycin trough level 15-20 mcg/ml  Plan:  Continue IV Zithromax- change to oral once appropriate Continue Cefepime 1gm IV q8h Vancomycin 1gm IV q24h Check Vancomycin trough at steady state Monitor renal function and cx data   Lavonia Drafts Caprice Mccaffrey 03/22/2014,12:13 PM

## 2014-03-22 NOTE — ED Notes (Signed)
Pt states she has been having problems since January. Has seen 4 MD & has taken all antibiotic. Last time treated was a month ago.

## 2014-03-22 NOTE — ED Provider Notes (Signed)
CSN: [id number]     Arrival date & time 03/22/14  0548 History   First MD Initiated Contact with Patient 03/22/14 [phone]     Chief Complaint  Patient presents with  . Cough  . Nasal Congestion     (Consider location/radiation/quality/duration/timing/severity/associated sxs/prior Treatment) HPI  This a 73 year old female with history of COPD who presents with shortness of breath. Patient presents with a one-week history of worsening shortness of breath. Patient reports nonproductive cough. She denies any fevers or chills. Patient states that she's recently been sick "a lot." She states that she will be given prednisone and antibiotics and will get better for a short period of time. She is on home oxygen. Patient endorses left lateral chest pain that is worse with coughing and breathing. She denies any lower extremity swelling or history of PE.    Past Medical History  Diagnosis Date  . Osteoporosis   . Hyperlipidemia   . COPD (chronic obstructive pulmonary disease)   . Chronic UTI   . BV (bacterial vaginosis)   . FH: cataracts    Past Surgical History  Procedure Laterality Date  . Fracture surgery      rt left knee cap  . Colonoscopy  04/19/2012    Procedure: COLONOSCOPY;  Surgeon: Danie Binder, MD;  Location: [facility];  Service: Endoscopy;  Laterality: N/A;  11:30 AM   No family history on file. History  Substance Use Topics  . Smoking status: Never Smoker   . Smokeless tobacco: Never Used  . Alcohol Use: No   OB History   Grav Para Term Preterm Abortions TAB SAB Ect Mult Living                 Review of Systems  Constitutional: Negative for fever.  Respiratory: Positive for cough and shortness of breath. Negative for chest tightness.   Cardiovascular: Positive for chest pain. Negative for palpitations and leg swelling.  Gastrointestinal: Negative for nausea, vomiting and abdominal pain.  Genitourinary: Negative for dysuria.  Musculoskeletal: Negative for back  pain.  Skin: Negative for rash.  Neurological: Negative for dizziness and headaches.  Psychiatric/Behavioral: Negative for confusion.  All other systems reviewed and are negative.     Allergies  Naproxen sodium; Symbicort; Alendronate sodium; Boniva; Risedronate sodium; and Zetia  Home Medications   Prior to Admission medications   Medication Sig Start Date End Date Taking? Authorizing Provider  ADVAIR DISKUS 250-50 MCG/DOSE AEPB USE ONE INHALATION BY MOUTH TWICE DAILY 11/24/13  Yes Susy Frizzle, MD  ipratropium (ATROVENT HFA) 17 MCG/ACT inhaler Inhale 2 puffs into the lungs every 6 (six) hours. 10/31/13  Yes Susy Frizzle, MD  ipratropium (ATROVENT) 0.02 % nebulizer solution Take 2.5 mLs (500 mcg total) by nebulization 2 (two) times daily as needed. For shortness of breath 12/21/13  Yes Alycia Rossetti, MD  zolpidem (AMBIEN) 10 MG tablet TAKE 1 TABLET BY MOUTH ONCE DAILY AT BEDTIME AS NEEDED   Yes Susy Frizzle, MD  zolpidem (AMBIEN) 5 MG tablet Take 5 mg by mouth at bedtime as needed for sleep.   Yes Historical Provider, MD  Calcium-Magnesium-Vitamin D [phone] LIQD Take 1,000 mg by mouth every morning. Take 5 milliliters  By mouth once daily    Historical Provider, MD  moxifloxacin (AVELOX) 400 MG tablet Take 1 tablet (400 mg total) by mouth daily. 12/21/13   Alycia Rossetti, MD  Multiple Vitamin (MULITIVITAMIN) LIQD Take 5 mLs by mouth every morning.  Historical Provider, MD  predniSONE (DELTASONE) 20 MG tablet Take 3 tabs x 3 days, then 2 tabs x 3 days, 1 tab x 3 days 12/21/13   Alycia Rossetti, MD   BP 130/64  Pulse 114  Temp(Src) 97.9 F (36.6 C) (Oral)  Resp 19  Ht 5\' 7"  (1.702 m)  Wt 118 lb (53.524 kg)  BMI 18.48 kg/m2  SpO2 96% Physical Exam  Nursing note and vitals reviewed. Constitutional: She is oriented to person, place, and time. No distress.  Elderly, nasal cannula in place  HENT:  Head: Normocephalic and atraumatic.  Mouth/Throat: Oropharynx is  clear and moist.  Eyes: Pupils are equal, round, and reactive to light.  Neck: Neck supple. No JVD present.  Cardiovascular: Regular rhythm and normal heart sounds.   Tachycardia  Pulmonary/Chest: Effort normal. No respiratory distress. She has wheezes.  Fair air movement, decrease in bilateral upper lobes  Abdominal: Soft. There is no tenderness.  Musculoskeletal: She exhibits no edema.  Neurological: She is alert and oriented to person, place, and time.  Skin: Skin is warm and dry.  Psychiatric: She has a normal mood and affect.    ED Course  Procedures (including critical care time) Labs Review Labs Reviewed  CBC WITH DIFFERENTIAL - Abnormal; Notable for the following:    WBC 19.9 (*)    Platelets 551 (*)    Neutrophils Relative % 87 (*)    Neutro Abs 17.3 (*)    Lymphocytes Relative 6 (*)    Monocytes Absolute 1.3 (*)    All other components within normal limits  BASIC METABOLIC PANEL - Abnormal; Notable for the following:    Sodium 132 (*)    Chloride 93 (*)    Glucose, Bld 202 (*)    GFR calc non Af Amer 84 (*)    All other components within normal limits  CULTURE, BLOOD (ROUTINE X 2)  CULTURE, BLOOD (ROUTINE X 2)    Imaging Review Dg Chest Portable 1 View  03/22/2014   CLINICAL DATA:  Cough, nasal congestion.  EXAM: PORTABLE CHEST - 1 VIEW  COMPARISON:  CT CHEST W/CM dated 01/19/2014; DG CHEST 2V dated 01/16/2014  FINDINGS: Bibasilar patchy alveolar airspace opacities in a background of chronic interstitial changes and increased lung volumes. Possible small pleural effusions.  Cardiac silhouette is unremarkable. Mildly calcified aortic knob, mediastinal silhouette is otherwise unremarkable. Nodular densities again seen in lung apices bilaterally. No pneumothorax though lung apices are obscured by overlying facial structures. Osteopenia. Soft tissue planes are nonsuspicious.  IMPRESSION: COPD with superimposed bibasilar consolidations concerning for pneumonia with suspected  small pleural effusions. Small apical nodules. Recommend followup chest radiograph after treatment to verify improvement.   Electronically Signed   By: Elon Alas   On: 03/22/2014 06:40     EKG Interpretation   Date/Time:  Wednesday March 22 2014 06:08:51 EDT Ventricular Rate:  128 PR Interval:  124 QRS Duration: 76 QT Interval:  336 QTC Calculation: 490 R Axis:   66 Text Interpretation:  Sinus tachycardia Nonspecific ST abnormality  Abnormal ECG When compared with ECG of 24-Mar-2010 19:19, Vent. rate has  increased BY  54 BPM Nonspecific T wave abnormality no longer evident in  Lateral leads Confirmed by Moani Weipert  MD, [place] ([id number]) on 03/22/2014  6:31:20 AM      MDM   Final diagnoses:  CAP (community acquired pneumonia)    Patient presents with cough and worsening shortness of breath. Noted to be hypoxic to 86% on room  air. On home oxygen. Decreased breath sounds bilaterally with fair air movement and wheezing. Patient given a duo neb and prednisone. Chest x-ray shows bibasilar opacities and small pleural effusions concerning for pneumonia. Patient was given Rocephin and azithromycin. Will be admitted for further management.    Merryl Hacker, MD 03/22/14 [phone]

## 2014-03-22 NOTE — H&P (Signed)
History and Physical  [name] NWG:[id number] DOB: [birth year] DOA: 03/22/2014  Referring physician: Quenten Raven, MD PCP: Odette Fraction, MD   Chief Complaint: Short of breath  HPI:  73 year old woman with history of COPD, former smoker, bronchiectasis presented to the emergency department with increasing dyspnea on exertion, productive cough and shortness of breath. Initial evaluation suggested bilateral pneumonia and mild hypoxia in patient was referred for admission.  Patient quit smoking approximately 30 years ago after smoking for 30 years. No history of hypoxia or home oxygen use. At baseline she does become somewhat short of breath, however since 12/2013 she has had increasing productive cough, dyspnea on exertion. She was seen by PCP 12/2013 and treated with Avelox and prednisone. Seen in urgent care 02/2014 (no records available) and treated with an additional antibiotic. No antibiotics in the last 2 weeks. She is also seen pulmonology 01/2014. She does report however increasing dyspnea on exertion and productive cough especially over the last several days. No fever or night sweats.  Chest CT 01/2014 revealed chronic bronchitis, bronchiectasis, marked COPD.  In the emergency department afebrile, tachycardic, normotensive. Stable chronic hypoxia. Normal respiratory rate. Treated with Zithromax, prednisone, Rocephin. CBC 19.9 with left shift. This metabolic panel unremarkable. Blood cultures pending. Chest x-ray suggested COPD with superimposed bibasilar consolidations concerning for pneumonia. EKG shows sinus tachycardia with no acute changes.  Review of Systems:  Negative for fever, chills, night sweats, weight loss, visual changes, sore throat, rash, new muscle aches, dysuria, bleeding, n/v/abdominal pain, diarrhea.  Past Medical History  Diagnosis Date  . Osteoporosis   . Hyperlipidemia   . COPD (chronic obstructive pulmonary disease)   . Chronic UTI   . BV (bacterial  vaginosis)   . FH: cataracts     Past Surgical History  Procedure Laterality Date  . Fracture surgery      rt left knee cap  . Colonoscopy  04/19/2012    Procedure: COLONOSCOPY;  Surgeon: Danie Binder, MD;  Location: [facility];  Service: Endoscopy;  Laterality: N/A;  11:30 AM    Social History:  reports that she has quit smoking. She has never used smokeless tobacco. She reports that she does not drink alcohol or use illicit drugs.  Allergies  Allergen Reactions  . Naproxen Sodium Nausea And Vomiting  . Symbicort [Budesonide-Formoterol Fumarate] Shortness Of Breath    Breathing difficulty  . Alendronate Sodium Other (See Comments)    Kidney failure  . Boniva [Ibandronate Sodium] Other (See Comments)    Kidney failure   . Risedronate Sodium   . Zetia [Ezetimibe] Other (See Comments)    Muscle pain    Family History  Problem Relation Age of Onset  . Cancer Father     pancreatic     Prior to Admission medications   Medication Sig Start Date End Date Taking? Authorizing Provider  ADVAIR DISKUS 250-50 MCG/DOSE AEPB USE ONE INHALATION BY MOUTH TWICE DAILY 11/24/13  Yes Susy Frizzle, MD  ipratropium (ATROVENT HFA) 17 MCG/ACT inhaler Inhale 2 puffs into the lungs every 6 (six) hours. 10/31/13  Yes Susy Frizzle, MD  ipratropium (ATROVENT) 0.02 % nebulizer solution Take 2.5 mLs (500 mcg total) by nebulization 2 (two) times daily as needed. For shortness of breath 12/21/13  Yes Alycia Rossetti, MD  zolpidem (AMBIEN) 10 MG tablet TAKE 1 TABLET BY MOUTH ONCE DAILY AT BEDTIME AS NEEDED   Yes Susy Frizzle, MD  zolpidem (AMBIEN) 5 MG tablet Take 5 mg by  mouth at bedtime as needed for sleep.   Yes Historical Provider, MD  Calcium-Magnesium-Vitamin D [phone] LIQD Take 1,000 mg by mouth every morning. Take 5 milliliters  By mouth once daily    Historical Provider, MD  moxifloxacin (AVELOX) 400 MG tablet Take 1 tablet (400 mg total) by mouth daily. 12/21/13   Alycia Rossetti, MD  Multiple Vitamin (MULITIVITAMIN) LIQD Take 5 mLs by mouth every morning.    Historical Provider, MD  predniSONE (DELTASONE) 20 MG tablet Take 3 tabs x 3 days, then 2 tabs x 3 days, 1 tab x 3 days 12/21/13   Alycia Rossetti, MD   Physical Exam: Filed Vitals:   03/22/14 2505 03/22/14 0619 03/22/14 0620 03/22/14 0644  BP: 130/64     Pulse: 134   114  Temp: 97.9 F (36.6 C)     TempSrc: Oral     Resp: 24   19  Height: 5\' 7"  (1.702 m)     Weight: 53.524 kg (118 lb)     SpO2: 92% 95% 95% 96%   General: examined in ED. Appears calm and comfortable. Nontoxic. Eyes: PERRL, normal lids, irises  ENT: grossly normal hearing, lips & tongue Neck: no LAD, masses or thyromegaly Cardiovascular: RRR, no m/r/g. No LE edema. Respiratory: CTA bilaterally, no w/r/r. Normal respiratory effort. No retractions. Abdomen: soft, ntnd Skin: no rash or induration seen  Musculoskeletal: grossly normal tone BUE/BLE Psychiatric: grossly normal mood and affect, speech fluent and appropriate Neurologic: grossly non-focal.  Wt Readings from Last 3 Encounters:  03/22/14 53.524 kg (118 lb)  12/23/13 53.524 kg (118 lb)  12/21/13 52.617 kg (116 lb)    Labs on Admission:  Basic Metabolic Panel:  Recent Labs Lab 03/22/14 0621  NA 132*  K 3.9  CL 93*  CO2 26  GLUCOSE 202*  BUN 21  CREATININE 0.72  CALCIUM 9.5    CBC:  Recent Labs Lab 03/22/14 0621  WBC 19.9*  NEUTROABS 17.3*  HGB 13.6  HCT 40.9  MCV 92.1  PLT 551*     Radiological Exams on Admission: Dg Chest Portable 1 View  03/22/2014   CLINICAL DATA:  Cough, nasal congestion.  EXAM: PORTABLE CHEST - 1 VIEW  COMPARISON:  CT CHEST W/CM dated 01/19/2014; DG CHEST 2V dated 01/16/2014  FINDINGS: Bibasilar patchy alveolar airspace opacities in a background of chronic interstitial changes and increased lung volumes. Possible small pleural effusions.  Cardiac silhouette is unremarkable. Mildly calcified aortic knob, mediastinal silhouette  is otherwise unremarkable. Nodular densities again seen in lung apices bilaterally. No pneumothorax though lung apices are obscured by overlying facial structures. Osteopenia. Soft tissue planes are nonsuspicious.  IMPRESSION: COPD with superimposed bibasilar consolidations concerning for pneumonia with suspected small pleural effusions. Small apical nodules. Recommend followup chest radiograph after treatment to verify improvement.   Electronically Signed   By: Elon Alas   On: 03/22/2014 06:40      Principal Problem:   Pneumonia Active Problems:   COPD (chronic obstructive pulmonary disease)   CAP (community acquired pneumonia)   Acute respiratory failure with hypoxia   Assessment/Plan 1. Bilateral pneumonia. Afebrile, nontoxic, minimal hypoxia.  2. Acute hypoxic respiratory failure. Secondary to pneumonia, superimposed COPD as below. 3. COPD. CT chest 01/2014 suggested interstitial scarring, chronic bronchitis, persistent bronchiectasis, marked emphysema.Currently appears stable.  4. Chest pain with cough. Yesterday. No pain today. Secondary to cough. EKG not acute. No further evaluation suggested.   Appears stable for admission to the medical floor. History  most suggestive of acute on chronic process, i.e., acute pneumonia superimposed on chronic bronchiectasis, bronchitis, COPD. Given her history of antibiotics in the last month, as well as bronchiectasis, treat with cefepime. Cover with Zithromax. Vancomycin. Likely narrow antibiotics in the next 24-48 hours.  Supplemental oxygen. Wean as tolerated.  Consult pulmonology for further recommendations.   Repeat chest x-ray 24-48 hours. Suggest outpatient pulmonology followup.  Discussed with husband at bedside.  Code Status: full code  DVT prophylaxis: Lovenox Family Communication:  Disposition Plan/Anticipated LOS: admit 2-3 days  Time spent: 60 minutes  Murray Hodgkins, MD  [facility] Pager  [phone] 03/22/2014, 7:46 AM

## 2014-03-23 DIAGNOSIS — E43 Unspecified severe protein-calorie malnutrition: Secondary | ICD-10-CM | POA: Insufficient documentation

## 2014-03-23 LAB — BASIC METABOLIC PANEL
BUN: 21 mg/dL (ref 6–23)
CHLORIDE: 97 meq/L (ref 96–112)
CO2: 25 meq/L (ref 19–32)
Calcium: 9.4 mg/dL (ref 8.4–10.5)
Creatinine, Ser: 0.46 mg/dL — ABNORMAL LOW (ref 0.50–1.10)
GFR calc Af Amer: >90 mL/min (ref >90)
GFR calc non Af Amer: >90 mL/min (ref >90)
GLUCOSE: 107 mg/dL — AB (ref 70–99)
POTASSIUM: 3.9 meq/L (ref 3.7–5.3)
SODIUM: 136 meq/L — AB (ref 137–147)

## 2014-03-23 LAB — CBC
HCT: 39.9 % (ref 36.0–46.0)
HEMOGLOBIN: 13.6 g/dL (ref 12.0–15.0)
MCH: 31.3 pg (ref 26.0–34.0)
MCHC: 34.1 g/dL (ref 30.0–36.0)
MCV: 91.7 fL (ref 78.0–100.0)
Platelets: 646 10*3/uL — ABNORMAL HIGH (ref 150–400)
RBC: 4.35 MIL/uL (ref 3.87–5.11)
RDW: 14.2 % (ref 11.5–15.5)
WBC: 24.3 10*3/uL — ABNORMAL HIGH (ref 4.0–10.5)

## 2014-03-23 LAB — LEGIONELLA ANTIGEN, URINE: Legionella Antigen, Urine: NEGATIVE

## 2014-03-23 LAB — EXPECTORATED SPUTUM ASSESSMENT W REFEX TO RESP CULTURE

## 2014-03-23 LAB — EXPECTORATED SPUTUM ASSESSMENT W GRAM STAIN, RFLX TO RESP C

## 2014-03-23 MED ORDER — POLYETHYLENE GLYCOL 3350 17 G PO PACK
17.0000 g | PACK | Freq: Two times a day (BID) | ORAL | Status: DC
  Administered 2014-03-23 – 2014-03-24 (×3): 17 g via ORAL
  Filled 2014-03-23 (×4): qty 1

## 2014-03-23 MED ORDER — MAGIC MOUTHWASH
10.0000 mL | Freq: Four times a day (QID) | ORAL | Status: DC
  Administered 2014-03-23 – 2014-03-25 (×9): 10 mL via ORAL
  Filled 2014-03-23 (×9): qty 10

## 2014-03-23 MED ORDER — GUAIFENESIN 100 MG/5ML PO SOLN
200.0000 mg | ORAL | Status: DC | PRN
  Administered 2014-03-23: 200 mg via ORAL
  Filled 2014-03-23: qty 5

## 2014-03-23 MED ORDER — MOMETASONE FURO-FORMOTEROL FUM 100-5 MCG/ACT IN AERO
2.0000 | INHALATION_SPRAY | Freq: Two times a day (BID) | RESPIRATORY_TRACT | Status: DC
  Filled 2014-03-23: qty 8.8

## 2014-03-23 MED ORDER — BOOST / RESOURCE BREEZE PO LIQD
1.0000 | Freq: Three times a day (TID) | ORAL | Status: DC
  Administered 2014-03-23 – 2014-03-25 (×4): 1 via ORAL

## 2014-03-23 MED ORDER — FLUTICASONE-SALMETEROL 250-50 MCG/DOSE IN AEPB
1.0000 | INHALATION_SPRAY | Freq: Two times a day (BID) | RESPIRATORY_TRACT | Status: DC
  Administered 2014-03-23: 1 via RESPIRATORY_TRACT

## 2014-03-23 MED ORDER — GUAIFENESIN ER 600 MG PO TB12
1200.0000 mg | ORAL_TABLET | Freq: Two times a day (BID) | ORAL | Status: DC
  Administered 2014-03-23: 1200 mg via ORAL
  Filled 2014-03-23: qty 2

## 2014-03-23 NOTE — Consult Note (Signed)
[name], [name]               ACCOUNT NO.:  [id number]  MEDICAL RECORD NO.:  [id number]  LOCATION:  A309                          FACILITY:  [facility]  PHYSICIAN:  Mariza Bourget L. Luan Padilla, M.D.DATE OF BIRTH:  [birth year]  DATE OF CONSULTATION:  03/22/2014 DATE OF DISCHARGE:                                CONSULTATION   Consult requested by Dr. Sarajane Jews.  REASON FOR CONSULTATION:  Pneumonia/COPD/respiratory failure.  HISTORY:  This is a 73 year old, who has a long known history of COPD and bronchiectasis.  She came to the emergency department on the day of admission with increasing shortness of breath, cough, congestion, and she had what appeared to be pneumonia on chest x-ray.  She was hypoxic. She had been having trouble pretty much all of this year.  She had at least two episodes of taking antibiotics in the last 3 months.  She has not had any definite fever.  She has had a productive cough.  PAST MEDICAL HISTORY:  Positive for COPD, which appears to be significantly bronchiectasis.  She has hyperlipidemia and osteoporosis.  PAST SURGICAL HISTORY:  She has had a fracture of her left knee cap that required surgery.  SOCIAL HISTORY:  She stopped smoking about 30 years ago and has about a 30 pack year smoking history.  She does not use any alcohol or other substances.  ALLERGIES:  She is allergic to multiple medications including NAPROXEN, SYMBICORT, ALENDRONATE, BONIVA, RISEDRONATE, ZETIA.  FAMILY HISTORY:  There is a positive family history of lung disease, and her father had pancreatic cancer.  CURRENT MEDICATIONS:  At home are: 1. Advair 250/50 one puff b.i.d. 2. Atrovent inhaler 2 puffs inhale every 6 hours. 3. She uses what I think is albuterol and Atrovent in the nebulizer as     needed. 4. Zolpidem 10 mg at bedtime. 5. Calcium, magnesium, and vitamin D as needed. 6. Avelox 400 mg daily. 7. Prednisone on a taper.  PHYSICAL EXAMINATION:  GENERAL:  A chronically sick  appearing female who is in no acute distress. VITAL SIGNS:  Temperature 97.4, pulse 102, respirations 18, blood pressure in the 90s, O2 sat also in the 90s. HEENT:  Pupils are reactive.  Nose and throat are clear.  Mucous membranes are moist. NECK:  Supple without masses. CHEST:  Rhonchi bilaterally. HEART:  Regular. ABDOMEN:  Soft. EXTREMITIES:  No edema.  ASSESSMENT:  She has chronic obstructive pulmonary disease.  She has been on and off antibiotics almost the entire year.  She has significant aspect of bronchiectasis, so she is at risk for more resistant organisms.  She is on appropriate treatment now.  I will add a flutter valve because she says that her cough had been productive but is less so now.  I will also add guaifenesin.  Thank you for allowing me to see her with you.     [name], M.D.     ELH/MEDQ  D:  03/23/2014  T:  03/23/2014  Job:  [id number]

## 2014-03-23 NOTE — Progress Notes (Signed)
PROGRESS NOTE  ALOISE COPUS TIW:[id number] DOB: Oct 26, 1941 DOA: 03/22/2014 PCP: Odette Fraction, MD  Summary: 73 year old woman with history of COPD, former smoker, bronchiectasis presented to the emergency department with increasing dyspnea on exertion, productive cough and shortness of breath. Initial evaluation suggested bilateral pneumonia and mild hypoxia in patient was referred for admission.  Assessment/Plan: 1. Bilateral pneumonia. Overall appears improved. Remains afebrile, nontoxic. Difficult to interpret CBC in context of steroid use. 2. Acute hypoxic respiratory failure, resolved. Secondary to pneumonia, component by COPD. 3. COPD. CT chest 01/2014 suggested interstitial scarring, chronic bronchitis, persistent bronchiectasis, marked emphysema.Currently appears stable.  4. Severe malnutrition in context of chronic illness   Overall appears to be improving. Plan to continue atypical coverage and cefepime today. Given clinical improvement, discontinue vancomycin. May be a transition to oral antibiotics tomorrow. Case discussed with Dr. Luan Pulling 4/15.  Pending studies:   Blood cultures, sputum culture  Code Status: full code DVT prophylaxis: Lovenox Family Communication: none present Disposition Plan: home  Murray Hodgkins, MD  [facility]  Pager [phone] If 7PM-7AM, please contact night-coverage at [email or web address], password [facility] 03/23/2014, 3:47 PM  LOS: 1 day   Consultants:  Pulmonology  Procedures:    Antibiotics:  Azithromycin 4/15 >>   Cefepime 4/15 >>   Vancomycin 4/15 >>   HPI/Subjective: Still coughing and somewhat short of breath but overall feels better.  Objective: Filed Vitals:   03/23/14 0609 03/23/14 0642 03/23/14 1105 03/23/14 1313  BP: 141/75   155/73  Pulse: 104   99  Temp: 97.8 F (36.6 C)   97.6 F (36.4 C)  TempSrc: Oral   Oral  Resp: 20   20  Height:      Weight:      SpO2: 93% 94% 95% 95%    Intake/Output Summary  (Last 24 hours) at 03/23/14 1547 Last data filed at 03/23/14 1313  Gross per 24 hour  Intake    530 ml  Output   1000 ml  Net   -470 ml     Filed Weights   03/22/14 0614 03/22/14 0858 03/22/14 0933  Weight: 53.524 kg (118 lb) 48.2 kg (106 lb 4.2 oz) 49 kg (108 lb 0.4 oz)    Exam:   Afebrile, vital signs are stable. No hypoxia. Normal respiratory rate.  Gen. Appears calm and comfortable. Speech fluent and clear.  Cardiovascular regular rate and rhythm. No murmur, rub or gallop. No lower extremity edema.  Respiratory generally clear. No wheezes, rales or rhonchi. Normal respiratory effort.  Psychiatric. Grossly normal mood and affect. Speech fluent and appropriate.  Data Reviewed:  Basic metabolic panel unremarkable.  WBC 19.9 >> 24.3 received steroids.  Scheduled Meds: . azithromycin  250 mg Intravenous Q24H  . ceFEPime (MAXIPIME) IV  1 g Intravenous Q8H  . enoxaparin (LOVENOX) injection  40 mg Subcutaneous Q24H  . feeding supplement (RESOURCE BREEZE)  1 Container Oral TID BM  . guaiFENesin  1,200 mg Oral BID  . ipratropium-albuterol  3 mL Nebulization TID  . magic mouthwash  10 mL Oral QID  . mometasone-formoterol  2 puff Inhalation BID  . polyethylene glycol  17 g Oral BID  . sodium chloride  3 mL Intravenous Q12H  . vancomycin  1,000 mg Intravenous Q24H  . zolpidem  5 mg Oral QHS   Continuous Infusions:   Principal Problem:   Pneumonia Active Problems:   COPD (chronic obstructive pulmonary disease)   CAP (community acquired pneumonia)   Acute respiratory failure with hypoxia  Protein-calorie malnutrition, severe   Time spent 20 minutes

## 2014-03-23 NOTE — Care Management Utilization Note (Signed)
UR completed 

## 2014-03-23 NOTE — Progress Notes (Signed)
INITIAL NUTRITION ASSESSMENT  DOCUMENTATION CODES Per approved criteria  -Severe malnutrition in the context of chronic illness   Pt meets criteria for severe MALNUTRITION in the context of chronic illness as evidenced by severe muscle depletion, 8.4% wt loss x 6 months, <75% estimated energy intake x 1 month.  INTERVENTION: Resource Breeze po TID, each supplement provides 250 kcal and 9 grams of protein  NUTRITION DIAGNOSIS: Inadequate oral intakerelated to decreased appetite  as evidenced by poor appetite x 3 weeks, 8.4% wt loss x 3 months.   Goal: Pt will meet >90% of estimated nutritional needs  Monitor:  PO intake, labs, skin assessments, weight changes, I/O's  Reason for Assessment: low BMI  73 y.o. female  Admitting Dx: Pneumonia  ASSESSMENT: Pt admitted from home with CAP. She reports feeling better overall, however, complains that she hasn't been feeling well over the past 3 months. She states "whenever I get like this, I can't eat". Documented PO data reveals 75% of breakfast meal completed, however, observed 25% intake of lunch meal. She reports a decreased appetite x 3 weeks and progressive weight loss over the past 3 months. Wt hx reveals a 8.4% wt loss over the past 3 months, which is clinically significant.  She reports she has not tried nutritional supplements before, but would be interested in doing so. She reports she has a milk allergy and would not like any milk-based supplements. Informed pt about Resource Breeze and encouraged trying Ensure Clear at home, as it is more widely available in retail settings. She requested this RD to write down Ensure Clear supplement on her notepad to purchase after discharged and this RD obliged.  Nutrition Focused Physical Exam:  Subcutaneous Fat:  Orbital Region: moderate depletion Upper Arm Region: moderate depletion Thoracic and Lumbar Region: moderate depletion  Muscle:  Temple Region: moderate depletion Clavicle Bone  Region: severe depletion Clavicle and Acromion Bone Region: moderate depletion Scapular Bone Region: moderate depletion Dorsal Hand: severe depletion Patellar Region: severe depletion Anterior Thigh Region: severe depletion Posterior Calf Region: moderate depletion  Edema: none present   Height: Ht Readings from Last 1 Encounters:  03/22/14 5\' 6"  (1.676 m)    Weight: Wt Readings from Last 1 Encounters:  03/22/14 108 lb 0.4 oz (49 kg)    Ideal Body Weight: 130#  % Ideal Body Weight: 83%  Wt Readings from Last 10 Encounters:  03/22/14 108 lb 0.4 oz (49 kg)  12/23/13 118 lb (53.524 kg)  12/21/13 116 lb (52.617 kg)  09/22/13 118 lb (53.524 kg)  04/04/13 116 lb (52.617 kg)  03/17/13 117 lb (53.071 kg)  04/19/12 127 lb (57.607 kg)  04/19/12 127 lb (57.607 kg)    Usual Body Weight: 118#  % Usual Body Weight: 92%  BMI:  Body mass index is 17.44 kg/(m^2). Meets criteria for underweight.   Estimated Nutritional Needs: Kcal: 1500-1700 daily Protein: 49-59 grams daily Fluid: 1.5-1.7 L daily  Skin: Intact  Diet Order: General  EDUCATION NEEDS: -Education needs addressed   Intake/Output Summary (Last 24 hours) at 03/23/14 1314 Last data filed at 03/23/14 1313  Gross per 24 hour  Intake    530 ml  Output   1000 ml  Net   -470 ml    Last BM: 03/21/14  Labs:   Recent Labs Lab 03/22/14 0621 03/23/14 0509  NA 132* 136*  K 3.9 3.9  CL 93* 97  CO2 26 25  BUN 21 21  CREATININE 0.72 0.46*  CALCIUM 9.5 9.4  GLUCOSE 202* 107*    CBG (last 3)  No results found for this basename: GLUCAP,  in the last 72 hours  Scheduled Meds: . azithromycin  250 mg Intravenous Q24H  . ceFEPime (MAXIPIME) IV  1 g Intravenous Q8H  . enoxaparin (LOVENOX) injection  40 mg Subcutaneous Q24H  . guaiFENesin  1,200 mg Oral BID  . ipratropium-albuterol  3 mL Nebulization TID  . magic mouthwash  10 mL Oral QID  . mometasone-formoterol  2 puff Inhalation BID  . polyethylene  glycol  17 g Oral BID  . sodium chloride  3 mL Intravenous Q12H  . vancomycin  1,000 mg Intravenous Q24H  . zolpidem  5 mg Oral QHS    Continuous Infusions:   Past Medical History  Diagnosis Date  . Osteoporosis   . Hyperlipidemia   . COPD (chronic obstructive pulmonary disease)   . Chronic UTI   . BV (bacterial vaginosis)   . FH: cataracts     Past Surgical History  Procedure Laterality Date  . Fracture surgery      rt left knee cap  . Colonoscopy  04/19/2012    Procedure: COLONOSCOPY;  Surgeon: Danie Binder, MD;  Location: [facility];  Service: Endoscopy;  Laterality: N/A;  11:30 AM    Alizay Bronkema A. Jimmye Norman, RD, LDN Pager: [phone]

## 2014-03-24 LAB — BASIC METABOLIC PANEL
BUN: 14 mg/dL (ref 6–23)
CO2: 26 mEq/L (ref 19–32)
Calcium: 8.8 mg/dL (ref 8.4–10.5)
Chloride: 97 mEq/L (ref 96–112)
Creatinine, Ser: 0.37 mg/dL — ABNORMAL LOW (ref 0.50–1.10)
GFR calc Af Amer: >90 mL/min (ref >90)
GFR calc non Af Amer: >90 mL/min (ref >90)
Glucose, Bld: 93 mg/dL (ref 70–99)
POTASSIUM: 3.9 meq/L (ref 3.7–5.3)
SODIUM: 135 meq/L — AB (ref 137–147)

## 2014-03-24 MED ORDER — AZITHROMYCIN 250 MG PO TABS
250.0000 mg | ORAL_TABLET | Freq: Every day | ORAL | Status: DC
  Administered 2014-03-25: 250 mg via ORAL
  Filled 2014-03-24: qty 1

## 2014-03-24 NOTE — Progress Notes (Signed)
[name], [name]               ACCOUNT NO.:  [id number]  MEDICAL RECORD NO.:  [id number]  LOCATION:  A309                          FACILITY:  [facility]  PHYSICIAN:  Altheia Shafran L. Luan Pulling, M.D.DATE OF BIRTH:  December 30, 1940  DATE OF PROCEDURE: DATE OF DISCHARGE:                                PROGRESS NOTE   REASON FOR CONSULTATION:  COPD with pneumonia and bronchiectasis.  SUBJECTIVE:  [name] says she does not feel as well today.  She says she has had a lot of cough and congestion and she is not able to get anything.  PHYSICAL EXAMINATION:  VITAL SIGNS:  Her temp is 97.8, pulse 104, respirations 20, blood pressure 141/75, O2 sats 95%. GENERAL:  She looks acutely sick. CHEST:  Shows rhonchi and wheezes bilaterally. HEART:  Regular. ABDOMEN:  Soft. EXTREMITIES:  She does not have any edema.  Her white blood count has gone up to 24,300 from 19,900, yesterday.  I have reviewed her medications and added Mucinex and I have also added a flutter valve.  ASSESSMENT:  She has chronic obstructive pulmonary disease exacerbation with bronchiectasis.  Her white blood count has gone up and she has been on steroids and that may be the problem with that.  My plan then is to continue with her current treatments and medications and no changes today.  She will continue current antibiotics, etc.     Richerd Grime L. Luan Pulling, M.D.     ELH/MEDQ  D:  03/23/2014  T:  03/24/2014  Job:  [id number]

## 2014-03-24 NOTE — Progress Notes (Signed)
  PROGRESS NOTE  [name] QPY:[id number] DOB: [birth year] DOA: 03/22/2014 PCP: Odette Fraction, MD  Summary: 73 year old woman with history of COPD, former smoker, bronchiectasis presented to the emergency department with increasing dyspnea on exertion, productive cough and shortness of breath. Initial evaluation suggested bilateral pneumonia and mild hypoxia in patient was referred for admission.  Assessment/Plan: 1. Bilateral pneumonia. Continues to improve. Remains afebrile, nontoxic. Difficult to interpret CBC in context of steroid use. 2. Acute hypoxic respiratory failure, resolved. Secondary to pneumonia, component of COPD. 3. COPD. CT chest 01/2014 suggested interstitial scarring, chronic bronchitis, persistent bronchiectasis, marked emphysema. Currently appears stable.  4. Severe malnutrition in context of chronic illness   Appears to be improving rapidly. Anticipate transitioning to oral antibiotics and discharged home 4/18. Pulmonology care appreciated.  Pending studies:   Blood cultures, sputum culture  Code Status: full code DVT prophylaxis: Lovenox Family Communication: none present Disposition Plan: home  Murray Hodgkins, MD  [facility]  Pager [phone] If 7PM-7AM, please contact night-coverage at [email or web address], password [facility] 03/24/2014, 1:19 PM  LOS: 2 days   Consultants:  Pulmonology  Procedures:    Antibiotics:  Azithromycin 4/15 >>   Cefepime 4/15 >>   Vancomycin 4/15 >> 4/16  HPI/Subjective: Overall feeling better, breathing better, cough better.  Objective: Filed Vitals:   03/23/14 2007 03/23/14 2152 03/24/14 0533 03/24/14 0730  BP:  175/84 135/74   Pulse:  128 106   Temp:  98.3 F (36.8 C) 98.3 F (36.8 C)   TempSrc:  Oral Oral   Resp:  20 20   Height:      Weight:      SpO2: 92% 92% 93% 93%    Intake/Output Summary (Last 24 hours) at 03/24/14 1319 Last data filed at 03/24/14 0900  Gross per 24 hour  Intake    580 ml   Output   1400 ml  Net   -820 ml     Filed Weights   03/22/14 0614 03/22/14 0858 03/22/14 0933  Weight: 53.524 kg (118 lb) 48.2 kg (106 lb 4.2 oz) 49 kg (108 lb 0.4 oz)    Exam:   Afebrile, vital signs are stable. No hypoxia. Normal respiratory rate.  Gen. Appears comfortable, calm.  Respiratory clear to auscultation bilaterally without wheezes, rales or rhonchi. Normal respiratory effort.  Cardiovascular regular rate and rhythm. No murmur, rub or gallop.  Psychiatric. Grossly normal mood and affect. Speech fluent and appropriate.  Data Reviewed:  Basic metabolic panel unremarkable.  Sputum culture pending.  HIV nonreactive. Strep pneumonia and Legionella urinary antigens negative.  Blood cultures no growth to date.  Scheduled Meds: . azithromycin  250 mg Intravenous Q24H  . ceFEPime (MAXIPIME) IV  1 g Intravenous Q8H  . enoxaparin (LOVENOX) injection  40 mg Subcutaneous Q24H  . feeding supplement (RESOURCE BREEZE)  1 Container Oral TID BM  . Fluticasone-Salmeterol  1 puff Inhalation BID  . ipratropium-albuterol  3 mL Nebulization TID  . magic mouthwash  10 mL Oral QID  . polyethylene glycol  17 g Oral BID  . sodium chloride  3 mL Intravenous Q12H  . zolpidem  5 mg Oral QHS   Continuous Infusions:   Principal Problem:   Pneumonia Active Problems:   COPD (chronic obstructive pulmonary disease)   CAP (community acquired pneumonia)   Acute respiratory failure with hypoxia   Protein-calorie malnutrition, severe   Time spent 20 minutes

## 2014-03-24 NOTE — Progress Notes (Signed)
ANTIBIOTIC CONSULT NOTE -   Pharmacy Consult for Cefepime & Zithromax Indication: pneumonia  Allergies  Allergen Reactions  . Naproxen Sodium Nausea And Vomiting  . Symbicort [Budesonide-Formoterol Fumarate] Shortness Of Breath    Breathing difficulty  . Alendronate Sodium Other (See Comments)    Kidney failure  . Boniva [Ibandronate Sodium] Other (See Comments)    Kidney failure   . Risedronate Sodium   . Zetia [Ezetimibe] Other (See Comments)    Muscle pain   Patient Measurements: Height: 5\' 6"  (167.6 cm) Weight: 108 lb 0.4 oz (49 kg) IBW/kg (Calculated) : 59.3  Vital Signs: Temp: 98.3 F (36.8 C) (04/17 0533) Temp src: Oral (04/17 0533) BP: 135/74 mmHg (04/17 0533) Pulse Rate: 106 (04/17 0533) Intake/Output from previous day: 04/16 0701 - 04/17 0700 In: 995 [P.O.:720; IV Piggyback:275] Out: 1300 [Urine:1300] Intake/Output from this shift: Total I/O In: 240 [P.O.:240] Out: 100 [Urine:100]  Labs:  Recent Labs  03/22/14 0621 03/23/14 0509 03/24/14 0507  WBC 19.9* 24.3*  --   HGB 13.6 13.6  --   PLT 551* 646*  --   CREATININE 0.72 0.46* 0.37*   Estimated Creatinine Clearance: 49.9 ml/min (by C-G formula based on Cr of 0.37). No results found for this basename: VANCOTROUGH, Corlis Leak, VANCORANDOM, GENTTROUGH, GENTPEAK, GENTRANDOM, TOBRATROUGH, TOBRAPEAK, TOBRARND, AMIKACINPEAK, AMIKACINTROU, AMIKACIN,  in the last 72 hours   Microbiology: Recent Results (from the past 720 hour(s))  CULTURE, BLOOD (ROUTINE X 2)     Status: None   Collection Time    03/22/14  6:56 AM      Result Value Ref Range Status   Specimen Description BLOOD RIGHT ARM   Final   Special Requests BOTTLES DRAWN AEROBIC AND ANAEROBIC 8CC   Final   Culture NO GROWTH 2 DAYS   Final   Report Status PENDING   Incomplete  CULTURE, BLOOD (ROUTINE X 2)     Status: None   Collection Time    03/22/14  6:56 AM      Result Value Ref Range Status   Specimen Description BLOOD LEFT ARM   Final   Special Requests BOTTLES DRAWN AEROBIC AND ANAEROBIC 10CC   Final   Culture NO GROWTH 2 DAYS   Final   Report Status PENDING   Incomplete  CULTURE, EXPECTORATED SPUTUM-ASSESSMENT     Status: None   Collection Time    03/23/14 12:15 PM      Result Value Ref Range Status   Specimen Description SPUTUM EXPECTORATED   Final   Special Requests NONE   Final   Sputum evaluation     Final   Value: THIS SPECIMEN IS ACCEPTABLE. RESPIRATORY CULTURE REPORT TO FOLLOW.     Performed at [facility]   Report Status 03/23/2014 FINAL   Final  CULTURE, RESPIRATORY (NON-EXPECTORATED)     Status: None   Collection Time    03/23/14 12:15 PM      Result Value Ref Range Status   Specimen Description SPUTUM EXPECTORATED   Final   Special Requests NONE   Final   Gram Stain     Final   Value: ABUNDANT WBC PRESENT,BOTH PMN AND MONONUCLEAR     NO SQUAMOUS EPITHELIAL CELLS SEEN     NO ORGANISMS SEEN     Performed at [facility]   Culture     Final   Value: Culture reincubated for better growth     Performed at [facility]   Report Status PENDING   Incomplete  Medical History: Past Medical History  Diagnosis Date  . Osteoporosis   . Hyperlipidemia   . COPD (chronic obstructive pulmonary disease)   . Chronic UTI   . BV (bacterial vaginosis)   . FH: cataracts    Medications:  Scheduled:  . azithromycin  250 mg Intravenous Q24H  . ceFEPime (MAXIPIME) IV  1 g Intravenous Q8H  . enoxaparin (LOVENOX) injection  40 mg Subcutaneous Q24H  . feeding supplement (RESOURCE BREEZE)  1 Container Oral TID BM  . Fluticasone-Salmeterol  1 puff Inhalation BID  . ipratropium-albuterol  3 mL Nebulization TID  . magic mouthwash  10 mL Oral QID  . polyethylene glycol  17 g Oral BID  . sodium chloride  3 mL Intravenous Q12H  . zolpidem  5 mg Oral QHS   Assessment: 73 yo F with hx COPD to start on empiric, broad-spectrum antibiotics for PNA.   She was been treated for worsening productive  cough & dyspnea with course of oral antibiotics/steroids twice since Jan 2015.  CXR + PNA. WBC is elevated, but patient remains afebrile.  Pt states she does not feel well today, more cough and congestion. Renal function is at patient's baseline.  Vancomycin has been stopped.  Vancomycin 4/15>>4/16 Cefepime 4/15>> Zithromax 4/15>>  Goal of Therapy:  Vancomycin trough level 15-20 mcg/ml  Plan:   Continue IV Zithromax- change to oral once appropriate  Continue Cefepime 1gm IV q8h  Monitor renal function and cx data   Ndeye Tenorio A Sahirah Rudell 03/24/2014,10:32 AM

## 2014-03-24 NOTE — Progress Notes (Signed)
PHARMACIST - PHYSICIAN COMMUNICATION DR:   [place] CONCERNING: Antibiotic IV to Oral Route Change Policy  RECOMMENDATION: This patient is receiving Zithromax by the intravenous route.  Based on criteria approved by the Pharmacy and Therapeutics Committee, the antibiotic(s) is/are being converted to the equivalent oral dose form(s).  DESCRIPTION: These criteria include:  Patient being treated for a respiratory tract infection, urinary tract infection, cellulitis or clostridium difficile associated diarrhea if on metronidazole  The patient is not neutropenic and does not exhibit a GI malabsorption state  The patient is eating (either orally or via tube) and/or has been taking other orally administered medications for a least 24 hours  The patient is improving clinically and has a Tmax < 100.5  If you have questions about this conversion, please contact the Pharmacy Department  [x]  ( [phone] )  [place] []  ( [phone] )  [place]  []  ( [phone] )  [facility] []  ( [phone] )  [facility]    S. Cienna Dumais, PharmD 

## 2014-03-25 DIAGNOSIS — J449 Chronic obstructive pulmonary disease, unspecified: Secondary | ICD-10-CM

## 2014-03-25 DIAGNOSIS — J189 Pneumonia, unspecified organism: Secondary | ICD-10-CM

## 2014-03-25 DIAGNOSIS — J9600 Acute respiratory failure, unspecified whether with hypoxia or hypercapnia: Secondary | ICD-10-CM

## 2014-03-25 DIAGNOSIS — J4489 Other specified chronic obstructive pulmonary disease: Secondary | ICD-10-CM

## 2014-03-25 DIAGNOSIS — E43 Unspecified severe protein-calorie malnutrition: Secondary | ICD-10-CM

## 2014-03-25 MED ORDER — CIPROFLOXACIN HCL 250 MG PO TABS: 750.0000 mg | ORAL_TABLET | Freq: Two times a day (BID) | ORAL | Status: DC

## 2014-03-25 MED ORDER — CIPROFLOXACIN HCL 750 MG PO TABS: 750.0000 mg | ORAL_TABLET | Freq: Two times a day (BID) | ORAL | Status: DC

## 2014-03-25 NOTE — Progress Notes (Signed)
[name], JEFCOAT               ACCOUNT NO.:  [id number]  MEDICAL RECORD NO.:  [id number]  LOCATION:  A309                          FACILITY:  [facility]  PHYSICIAN:  Kamariya Blevens L. Luan Pulling, M.D.DATE OF BIRTH:  06-Jan-1941  DATE OF PROCEDURE: DATE OF DISCHARGE:                                PROGRESS NOTE   [name] is admitted with COPD and exacerbation of bronchiectasis. She says she feels a little bit better today.  She wants to get up and walk, but apparently she has a bed alarm on, but she has been doing okay as far as her ability to walk.  She has no other new complaints.  PHYSICAL EXAMINATION:  GENERAL:  She is awake and alert, and oriented. HEART:  Regular. ABDOMEN:  Soft. EXTREMITIES:  She has no edema. CENTRAL NERVOUS SYSTEM:  Grossly intact.  I reviewed her medications and they are appropriate.  Her vital signs are as recorded.  My assessment then is that she is admitted with chronic obstructive pulmonary disease exacerbation/bronchiectasis.  She is improving slowly. She wants to get up and move around.  Then, I think that it is probably a good thing.  I have changed her activity level.  Otherwise, she will continue current medications and treatments with no change in current medicines.     Klark Vanderhoef L. Luan Pulling, M.D.     ELH/MEDQ  D:  03/24/2014  T:  03/25/2014  Job:  [id number]

## 2014-03-25 NOTE — Discharge Summary (Signed)
Physician Discharge Summary  [name] NLZ:[id number] DOB: Feb 19, 1941 DOA: 03/22/2014  PCP: Odette Fraction, MD  Admit date: 03/22/2014 Discharge date: 03/25/2014  Recommendations for Outpatient Follow-up:  1. Resolution of pneumonia. 2. Followup COPD, bronchiectasis. 3. Consider repeat CBC as an outpatient followup leukocytosis 4. Followup severe malnutrition   Follow-up Information   Follow up with [facility] TOM, MD In 2 weeks.   Specialty:  Family Medicine   Contact information:   [address] [place] [address] [phone]       Follow up with HAWKINS,EDWARD L, MD. Schedule an appointment as soon as possible for a visit in 1 week.   Specialty:  Pulmonary Disease   Contact information:   [address] [place] [address] [phone]      Discharge Diagnoses:  1. Bilateral pneumonia 2. Acute hypoxic respiratory failure 3. COPD 4. Bronchiectasis 5. Severe malnutrition in context of chronic illness  Discharge Condition: Improved Disposition: Home  Diet recommendation: Regular  Filed Weights   03/22/14 0614 03/22/14 0858 03/22/14 0933  Weight: 53.524 kg (118 lb) 48.2 kg (106 lb 4.2 oz) 49 kg (108 lb 0.4 oz)    History of present illness:  73 year old woman with history of COPD, former smoker, bronchiectasis presented to the emergency department with increasing dyspnea on exertion, productive cough and shortness of breath. Initial evaluation suggested bilateral pneumonia and mild hypoxia in patient was referred for admission.  Hospital Course:  Patient was treated with broad-spectrum antibiotics cefepime, azithromycin with gradual improvement and resolution of hypoxic respiratory failure. She was seen in consultation with pulmonology. Hospitalization was uncomplicated, now stable for discharge, complete oral antibiotics as an outpatient and followup with pulmonology. Individual issues as below.  1. Bilateral pneumonia.  Appears clinically resolved. Remains afebrile, nontoxic.  2. Acute hypoxic respiratory failure, resolved. Secondary to pneumonia, component of COPD. 3. COPD. CT chest 01/2014 suggested interstitial scarring, chronic bronchitis, persistent bronchiectasis, marked emphysema. Currently appears stable.  4. Severe malnutrition in context of chronic illness  Consultants:  Pulmonology Procedures: none Antibiotics:  Azithromycin 4/15 >> 4/19  Ciprofloxacin 4/18 >> 4/24  Cefepime 4/15 >> 4/18  Vancomycin 4/15 >> 4/16  Discharge Instructions  Discharge Orders   Future Orders Complete By Expires   Activity as tolerated - No restrictions  As directed    Diet general  As directed    Discharge instructions  As directed        Medication List         ciprofloxacin 750 MG tablet  Commonly known as:  CIPRO  Take 1 tablet (750 mg total) by mouth 2 (two) times daily.  Start taking on:  03/26/2014     Fluticasone-Salmeterol 250-50 MCG/DOSE Aepb  Commonly known as:  ADVAIR  Inhale 1 puff into the lungs 2 (two) times daily.     ipratropium 0.02 % nebulizer solution  Commonly known as:  ATROVENT  Take 2.5 mLs (500 mcg total) by nebulization 2 (two) times daily as needed. For shortness of breath     ipratropium 17 MCG/ACT inhaler  Commonly known as:  ATROVENT HFA  Inhale 2 puffs into the lungs every 6 (six) hours.     zolpidem 10 MG tablet  Commonly known as:  AMBIEN  Take 10 mg by mouth at bedtime.       Allergies  Allergen Reactions  . Naproxen Sodium Nausea And Vomiting  . Symbicort [Budesonide-Formoterol Fumarate] Shortness Of Breath    Breathing difficulty  .  Alendronate Sodium Other (See Comments)    Kidney failure  . Boniva [Ibandronate Sodium] Other (See Comments)    Kidney failure   . Risedronate Sodium   . Zetia [Ezetimibe] Other (See Comments)    Muscle pain    The results of significant diagnostics from this hospitalization (including imaging, microbiology, ancillary  and laboratory) are listed below for reference.    Significant Diagnostic Studies: Dg Chest Portable 1 View  03/22/2014   CLINICAL DATA:  Cough, nasal congestion.  EXAM: PORTABLE CHEST - 1 VIEW  COMPARISON:  CT CHEST W/CM dated 01/19/2014; DG CHEST 2V dated 01/16/2014  FINDINGS: Bibasilar patchy alveolar airspace opacities in a background of chronic interstitial changes and increased lung volumes. Possible small pleural effusions.  Cardiac silhouette is unremarkable. Mildly calcified aortic knob, mediastinal silhouette is otherwise unremarkable. Nodular densities again seen in lung apices bilaterally. No pneumothorax though lung apices are obscured by overlying facial structures. Osteopenia. Soft tissue planes are nonsuspicious.  IMPRESSION: COPD with superimposed bibasilar consolidations concerning for pneumonia with suspected small pleural effusions. Small apical nodules. Recommend followup chest radiograph after treatment to verify improvement.   Electronically Signed   By: Elon Alas   On: 03/22/2014 06:40    Microbiology: Recent Results (from the past 240 hour(s))  CULTURE, BLOOD (ROUTINE X 2)     Status: None   Collection Time    03/22/14  6:56 AM      Result Value Ref Range Status   Specimen Description BLOOD RIGHT ARM   Final   Special Requests BOTTLES DRAWN AEROBIC AND ANAEROBIC 8CC   Final   Culture NO GROWTH 3 DAYS   Final   Report Status PENDING   Incomplete  CULTURE, BLOOD (ROUTINE X 2)     Status: None   Collection Time    03/22/14  6:56 AM      Result Value Ref Range Status   Specimen Description BLOOD LEFT ARM   Final   Special Requests BOTTLES DRAWN AEROBIC AND ANAEROBIC 10CC   Final   Culture NO GROWTH 3 DAYS   Final   Report Status PENDING   Incomplete  CULTURE, EXPECTORATED SPUTUM-ASSESSMENT     Status: None   Collection Time    03/23/14 12:15 PM      Result Value Ref Range Status   Specimen Description SPUTUM EXPECTORATED   Final   Special Requests NONE   Final     Sputum evaluation     Final   Value: THIS SPECIMEN IS ACCEPTABLE. RESPIRATORY CULTURE REPORT TO FOLLOW.     Performed at [facility]   Report Status 03/23/2014 FINAL   Final  CULTURE, RESPIRATORY (NON-EXPECTORATED)     Status: None   Collection Time    03/23/14 12:15 PM      Result Value Ref Range Status   Specimen Description SPUTUM EXPECTORATED   Final   Special Requests NONE   Final   Gram Stain     Final   Value: ABUNDANT WBC PRESENT,BOTH PMN AND MONONUCLEAR     NO SQUAMOUS EPITHELIAL CELLS SEEN     NO ORGANISMS SEEN     Performed at [facility]   Culture     Final   Value: Culture reincubated for better growth     Performed at [facility]   Report Status PENDING   Incomplete     Labs: Basic Metabolic Panel:  Recent Labs Lab 03/22/14 0621 03/23/14 0509 03/24/14 0507  NA 132* 136*  135*  K 3.9 3.9 3.9  CL 93* 97 97  CO2 26 25 26   GLUCOSE 202* 107* 93  BUN 21 21 14   CREATININE 0.72 0.46* 0.37*  CALCIUM 9.5 9.4 8.8   CBC:  Recent Labs Lab 03/22/14 0621 03/23/14 0509  WBC 19.9* 24.3*  NEUTROABS 17.3*  --   HGB 13.6 13.6  HCT 40.9 39.9  MCV 92.1 91.7  PLT 551* 646*    Principal Problem:   Pneumonia Active Problems:   COPD (chronic obstructive pulmonary disease)   CAP (community acquired pneumonia)   Acute respiratory failure with hypoxia   Protein-calorie malnutrition, severe   Time coordinating discharge: 25 minutes  Signed:  Murray Hodgkins, MD [facility] 03/25/2014, 2:04 PM

## 2014-03-25 NOTE — Plan of Care (Signed)
Problem: Phase III Progression Outcomes Goal: Activity at appropriate level-compared to baseline (UP IN CHAIR FOR HEMODIALYSIS)  Outcome: Completed/Met Date Met:  03/25/14 Ambulating in halls

## 2014-03-25 NOTE — Progress Notes (Signed)
  PROGRESS NOTE  [name] GQQ:[id number] DOB: 1940/12/26 DOA: 03/22/2014 PCP: Odette Fraction, MD  Summary: 73 year old woman with history of COPD, former smoker, bronchiectasis presented to the emergency department with increasing dyspnea on exertion, productive cough and shortness of breath. Initial evaluation suggested bilateral pneumonia and mild hypoxia in patient was referred for admission.  Assessment/Plan: 1. Bilateral pneumonia. Appears clinically resolved. Remains afebrile, nontoxic.  2. Acute hypoxic respiratory failure, resolved. Secondary to pneumonia, component of COPD. 3. COPD. CT chest 01/2014 suggested interstitial scarring, chronic bronchitis, persistent bronchiectasis, marked emphysema. Currently appears stable.  4. Severe malnutrition in context of chronic illness   Continues to improve. Change to oral antibiotics (Cipro given bronchiectasis and lack of culture data, treated with Avelox 12/2013) and discharge home. Outpatient followup with pulmonology next week.  Followup CBC as an outpatient.  Pending studies:   Blood cultures no growth to date, sputum culture  Murray Hodgkins, MD  [facility]  Pager [phone] If 7PM-7AM, please contact night-coverage at [email or web address], password [facility] 03/25/2014, 1:50 PM  LOS: 3 days   Consultants:  Pulmonology  Procedures:    Antibiotics:  Azithromycin 4/15 >> 4/19  Ciprofloxacin 4/18 >> 4/24  Cefepime 4/15 >> 4/18  Vancomycin 4/15 >> 4/16  HPI/Subjective: Continues to feel better. Cough improving. Shortness of breath improving. Wants to go home.  Objective: Filed Vitals:   03/24/14 2236 03/25/14 0642 03/25/14 0647 03/25/14 0734  BP: 127/72 93/70 128/92   Pulse: 92 100    Temp: 97.4 F (36.3 C) 98.2 F (36.8 C)    TempSrc: Oral Oral    Resp: 17 18    Height:      Weight:      SpO2: 92% 94%  90%    Intake/Output Summary (Last 24 hours) at 03/25/14 1350 Last data filed at 03/25/14 0930  Gross per 24 hour  Intake    690 ml  Output      0 ml  Net    690 ml     Filed Weights   03/22/14 0614 03/22/14 0858 03/22/14 0933  Weight: 53.524 kg (118 lb) 48.2 kg (106 lb 4.2 oz) 49 kg (108 lb 0.4 oz)    Exam:   Afebrile, vital signs are stable. No hypoxia.   Gen. Appears comfortable, calm. Speech fluent and clear.  Respiratory clear to auscultation bilaterally. No wheezes, rales or rhonchi. Normal respiratory effort.  Cardiovascular regular rate and rhythm. No murmur, rub or gallop.   Psychiatric. Grossly normal mood and affect. Speech fluent and appropriate.   Scheduled Meds: . azithromycin  250 mg Oral Daily  . ceFEPime (MAXIPIME) IV  1 g Intravenous Q8H  . enoxaparin (LOVENOX) injection  40 mg Subcutaneous Q24H  . feeding supplement (RESOURCE BREEZE)  1 Container Oral TID BM  . Fluticasone-Salmeterol  1 puff Inhalation BID  . ipratropium-albuterol  3 mL Nebulization TID  . magic mouthwash  10 mL Oral QID  . polyethylene glycol  17 g Oral BID  . sodium chloride  3 mL Intravenous Q12H  . zolpidem  5 mg Oral QHS   Continuous Infusions:   Principal Problem:   Pneumonia Active Problems:   COPD (chronic obstructive pulmonary disease)   CAP (community acquired pneumonia)   Acute respiratory failure with hypoxia   Protein-calorie malnutrition, severe

## 2014-03-25 NOTE — Progress Notes (Signed)
She says she feels well and wants to go home. She has no complaints. Her breathing is back to baseline. She's coughing up some sputum. She has been able to ambulate some.  Exam shows a temperature 98.2 pulse 100 respirations 18 blood pressure 120/92 O2 sats 90% her chest is much clearer than before her heart is regular her abdomen is soft. She looks more comfortable  I reviewed her medications  She has improved markedly. She is approaching discharge. I will plan to sign off but I think she already has a followup appointment in my office

## 2014-03-26 LAB — CULTURE, RESPIRATORY W GRAM STAIN

## 2014-03-26 LAB — CULTURE, RESPIRATORY

## 2014-03-27 LAB — CULTURE, BLOOD (ROUTINE X 2)
Culture: NO GROWTH
Culture: NO GROWTH

## 2014-04-03 ENCOUNTER — Encounter: Payer: Self-pay | Admitting: Family Medicine

## 2014-04-03 ENCOUNTER — Ambulatory Visit (INDEPENDENT_AMBULATORY_CARE_PROVIDER_SITE_OTHER): Payer: Medicare Other | Admitting: Family Medicine

## 2014-04-03 VITALS — BP 138/74 | HR 94 | Temp 97.0°F | Resp 24 | Ht 63.0 in | Wt 112.0 lb

## 2014-04-03 DIAGNOSIS — Z09 Encounter for follow-up examination after completed treatment for conditions other than malignant neoplasm: Secondary | ICD-10-CM

## 2014-04-03 DIAGNOSIS — Z23 Encounter for immunization: Secondary | ICD-10-CM

## 2014-04-03 LAB — COMPLETE METABOLIC PANEL WITH GFR
ALBUMIN: 3.9 g/dL (ref 3.5–5.2)
ALT: 19 U/L (ref 0–35)
AST: 31 U/L (ref 0–37)
Alkaline Phosphatase: 150 U/L — ABNORMAL HIGH (ref 39–117)
BILIRUBIN TOTAL: 0.4 mg/dL (ref 0.2–1.2)
BUN: 20 mg/dL (ref 6–23)
CO2: 29 mEq/L (ref 19–32)
CREATININE: 0.46 mg/dL — AB (ref 0.50–1.10)
Calcium: 9.6 mg/dL (ref 8.4–10.5)
Chloride: 98 mEq/L (ref 96–112)
GFR, Est African American: >89 mL/min
GLUCOSE: 98 mg/dL (ref 70–99)
POTASSIUM: 4.1 meq/L (ref 3.5–5.3)
Sodium: 136 mEq/L (ref 135–145)
Total Protein: 7.1 g/dL (ref 6.0–8.3)

## 2014-04-03 LAB — CBC WITH DIFFERENTIAL/PLATELET
BASOS ABS: 0.1 10*3/uL (ref 0.0–0.1)
Basophils Relative: 1 % (ref 0–1)
Eosinophils Absolute: 0.5 10*3/uL (ref 0.0–0.7)
Eosinophils Relative: 4 % (ref 0–5)
HCT: 43.7 % (ref 36.0–46.0)
Hemoglobin: 14.8 g/dL (ref 12.0–15.0)
LYMPHS ABS: 2.7 10*3/uL (ref 0.7–4.0)
Lymphocytes Relative: 21 % (ref 12–46)
MCH: 31 pg (ref 26.0–34.0)
MCHC: 33.9 g/dL (ref 30.0–36.0)
MCV: 91.4 fL (ref 78.0–100.0)
MONO ABS: 0.6 10*3/uL (ref 0.1–1.0)
Monocytes Relative: 5 % (ref 3–12)
NEUTROS ABS: 8.8 10*3/uL — AB (ref 1.7–7.7)
Neutrophils Relative %: 69 % (ref 43–77)
Platelets: 620 10*3/uL — ABNORMAL HIGH (ref 150–400)
RBC: 4.78 MIL/uL (ref 3.87–5.11)
RDW: 14.5 % (ref 11.5–15.5)
WBC: 12.7 10*3/uL — AB (ref 4.0–10.5)

## 2014-04-03 NOTE — Progress Notes (Signed)
Subjective:    Patient ID: [name], female    DOB: August 24, 1941, 73 y.o.   MRN: [id number]  HPI Patient was recently admitted to the hospital.  I have copied the d/c summary below for reference.  Admit date: 03/22/2014  Discharge date: 03/25/2014  Recommendations for Outpatient Follow-up:  1. Resolution of pneumonia. 2. Followup COPD, bronchiectasis. 3. Consider repeat CBC as an outpatient followup leukocytosis 4. Followup severe malnutrition Follow-up Information    Follow up with [facility] TOM, MD In 2 weeks.    Specialty: Family Medicine    Contact information:    [address]  [place] [address]  [phone]       Follow up with HAWKINS,EDWARD L, MD. Schedule an appointment as soon as possible for a visit in 1 week.    Specialty: Pulmonary Disease    Contact information:    [address]  [place] [address]  [phone]     Discharge Diagnoses:  1. Bilateral pneumonia 2. Acute hypoxic respiratory failure 3. COPD 4. Bronchiectasis 5. Severe malnutrition in context of chronic illness Discharge Condition: Improved  Disposition: Home  Diet recommendation: Regular  Filed Weights    03/22/14 0614  03/22/14 0858  03/22/14 0933   Weight:  53.524 kg (118 lb)  48.2 kg (106 lb 4.2 oz)  49 kg (108 lb 0.4 oz)   History of present illness:  73 year old woman with history of COPD, former smoker, bronchiectasis presented to the emergency department with increasing dyspnea on exertion, productive cough and shortness of breath. Initial evaluation suggested bilateral pneumonia and mild hypoxia in patient was referred for admission.  Hospital Course:  Patient was treated with broad-spectrum antibiotics cefepime, azithromycin with gradual improvement and resolution of hypoxic respiratory failure. She was seen in consultation with pulmonology. Hospitalization was uncomplicated, now stable for discharge, complete oral antibiotics as an outpatient  and followup with pulmonology. Individual issues as below.  1. Bilateral pneumonia. Appears clinically resolved. Remains afebrile, nontoxic.  2. Acute hypoxic respiratory failure, resolved. Secondary to pneumonia, component of COPD. 3. COPD. CT chest 01/2014 suggested interstitial scarring, chronic bronchitis, persistent bronchiectasis, marked emphysema. Currently appears stable.  4. Severe malnutrition in context of chronic illness Consultants:  Pulmonology Procedures: none  Antibiotics:  Azithromycin 4/15 >> 4/19  Ciprofloxacin 4/18 >> 4/24  Cefepime 4/15 >> 4/18  Vancomycin 4/15 >> 4/16 Discharge Instructions  Discharge Orders    Future Orders  Complete By  Expires    Activity as tolerated - No restrictions  As directed     Diet general  As directed     Discharge instructions  As directed         Medication List         ciprofloxacin 750 MG tablet    Commonly known as: CIPRO    Take 1 tablet (750 mg total) by mouth 2 (two) times daily.    Start taking on: 03/26/2014    Fluticasone-Salmeterol 250-50 MCG/DOSE Aepb    Commonly known as: ADVAIR    Inhale 1 puff into the lungs 2 (two) times daily.    ipratropium 0.02 % nebulizer solution    Commonly known as: ATROVENT    Take 2.5 mLs (500 mcg total) by nebulization 2 (two) times daily as needed. For shortness of breath    ipratropium 17 MCG/ACT inhaler    Commonly known as: ATROVENT HFA    Inhale 2 puffs into the lungs every 6 (six) hours.  zolpidem 10 MG tablet    Commonly known as: AMBIEN    Take 10 mg by mouth at bedtime.      Allergies   Allergen  Reactions   .  Naproxen Sodium  Nausea And Vomiting   .  Symbicort [Budesonide-Formoterol Fumarate]  Shortness Of Breath     Breathing difficulty   .  Alendronate Sodium  Other (See Comments)     Kidney failure   .  Boniva [Ibandronate Sodium]  Other (See Comments)     Kidney failure   .  Risedronate Sodium    .  Zetia [Ezetimibe]  Other (See Comments)     Muscle pain     The results of significant diagnostics from this hospitalization (including imaging, microbiology, ancillary and laboratory) are listed below for reference.   Significant Diagnostic Studies:  Dg Chest Portable 1 View  03/22/2014 CLINICAL DATA: Cough, nasal congestion. EXAM: PORTABLE CHEST - 1 VIEW COMPARISON: CT CHEST W/CM dated 01/19/2014; DG CHEST 2V dated 01/16/2014 FINDINGS: Bibasilar patchy alveolar airspace opacities in a background of chronic interstitial changes and increased lung volumes. Possible small pleural effusions. Cardiac silhouette is unremarkable. Mildly calcified aortic knob, mediastinal silhouette is otherwise unremarkable. Nodular densities again seen in lung apices bilaterally. No pneumothorax though lung apices are obscured by overlying facial structures. Osteopenia. Soft tissue planes are nonsuspicious. IMPRESSION: COPD with superimposed bibasilar consolidations concerning for pneumonia with suspected small pleural effusions. Small apical nodules. Recommend followup chest radiograph after treatment to verify improvement. Electronically Signed By: Elon Alas On: 03/22/2014 06:40     She has completed 10 days of antibiotics. She no longer has fever. Her cough is much better. Her cough is no longer productive. She is using Advair twice a day. She is using Atrovent once a day. Her breathing is back to her baseline. She denies any pleurisy or hemoptysis. Past Medical History  Diagnosis Date  . Osteoporosis   . Hyperlipidemia   . COPD (chronic obstructive pulmonary disease)   . Chronic UTI   . BV (bacterial vaginosis)   . FH: cataracts    Past Surgical History  Procedure Laterality Date  . Fracture surgery      rt left knee cap  . Colonoscopy  04/19/2012    Procedure: COLONOSCOPY;  Surgeon: Danie Binder, MD;  Location: [facility];  Service: Endoscopy;  Laterality: N/A;  11:30 AM   Current Outpatient Prescriptions on File Prior to Visit  Medication Sig Dispense  Refill  . Fluticasone-Salmeterol (ADVAIR) 250-50 MCG/DOSE AEPB Inhale 1 puff into the lungs 2 (two) times daily.      Marland Kitchen ipratropium (ATROVENT HFA) 17 MCG/ACT inhaler Inhale 2 puffs into the lungs every 6 (six) hours.  1 Inhaler  11  . zolpidem (AMBIEN) 10 MG tablet Take 10 mg by mouth at bedtime.      Marland Kitchen ipratropium (ATROVENT) 0.02 % nebulizer solution Take 2.5 mLs (500 mcg total) by nebulization 2 (two) times daily as needed. For shortness of breath  75 mL  5   No current facility-administered medications on file prior to visit.   Allergies  Allergen Reactions  . Naproxen Sodium Nausea And Vomiting  . Symbicort [Budesonide-Formoterol Fumarate] Shortness Of Breath    Breathing difficulty  . Alendronate Sodium Other (See Comments)    Kidney failure  . Boniva [Ibandronate Sodium] Other (See Comments)    Kidney failure   . Risedronate Sodium   . Zetia [Ezetimibe] Other (See Comments)    Muscle pain  History   Social History  . Marital Status: Married    Spouse Name: N/A    Number of Children: N/A  . Years of Education: N/A   Occupational History  . Not on file.   Social History Main Topics  . Smoking status: Former Research scientist (life sciences)  . Smokeless tobacco: Never Used  . Alcohol Use: No  . Drug Use: No  . Sexual Activity: Not on file     Comment: married, husband smokes.   Other Topics Concern  . Not on file   Social History Narrative  . No narrative on file      Review of Systems  All other systems reviewed and are negative.      Objective:   Physical Exam  Vitals reviewed. Constitutional: She appears well-developed and well-nourished.  HENT:  Right Ear: External ear normal.  Left Ear: External ear normal.  Nose: Nose normal.  Mouth/Throat: Oropharynx is clear and moist. No oropharyngeal exudate.  Eyes: Conjunctivae are normal.  Neck: Neck supple. No JVD present. No thyromegaly present.  Cardiovascular: Normal rate, regular rhythm and normal heart sounds.   No  murmur heard. Pulmonary/Chest: Effort normal. She has decreased breath sounds. She has no wheezes. She has no rhonchi. She has no rales.  Lymphadenopathy:    She has no cervical adenopathy.          Assessment & Plan:  Hospital discharge follow-up - Plan: COMPLETE METABOLIC PANEL WITH GFR, CBC with Differential  Need for prophylactic vaccination against Streptococcus pneumoniae (pneumococcus) - Plan: Pneumococcal conjugate vaccine 13-valent IM  Patient had Pneumovax 23. Today also gave her a booster for Prevnar 13. Clinically her pneumonia seems resolved. I recommended that she follow up in one month and at that time we will repeat a chest x-ray to insure complete resolution of the bibasilar consolidations. Not given any further antibiotics required at the present time. I recommended she continue Advair. I recommended that we also add spiriva 1 inh qday.  I am hoping that this will help improve her stamina and shortness of breath. I gave the patient samples. She will call me with an update in a couple weeks.

## 2014-04-10 ENCOUNTER — Telehealth: Payer: Self-pay | Admitting: Family Medicine

## 2014-04-10 NOTE — Telephone Encounter (Signed)
Pt states that since starting Spiriva she has been constipated and her urine has been real dark so she stopped the Spiriva and went back taking the Atrovent. She has an appt 04/26/14 and she said she would discuss it further with you at that time.

## 2014-04-10 NOTE — Telephone Encounter (Signed)
Message copied by Alyson Locket on Mon Apr 10, 2014  3:24 PM ------      Message from: Devoria Glassing      Created: Fri Apr 07, 2014  9:10 AM       Patient is calling about her medication Spiriva and how it is giving her some side effects thought we may want to know this before her appointment time in late may would like a call back at             [phone] ------

## 2014-05-04 ENCOUNTER — Ambulatory Visit (INDEPENDENT_AMBULATORY_CARE_PROVIDER_SITE_OTHER): Payer: Medicare Other | Admitting: Family Medicine

## 2014-05-04 ENCOUNTER — Encounter: Payer: Self-pay | Admitting: Family Medicine

## 2014-05-04 VITALS — BP 136/72 | HR 82 | Temp 97.6°F | Resp 22 | Ht 65.35 in | Wt 113.0 lb

## 2014-05-04 DIAGNOSIS — J449 Chronic obstructive pulmonary disease, unspecified: Secondary | ICD-10-CM

## 2014-05-04 DIAGNOSIS — E46 Unspecified protein-calorie malnutrition: Secondary | ICD-10-CM

## 2014-05-04 LAB — CBC WITH DIFFERENTIAL/PLATELET
Basophils Absolute: 0.2 10*3/uL — ABNORMAL HIGH (ref 0.0–0.1)
Basophils Relative: 2 % — ABNORMAL HIGH (ref 0–1)
Eosinophils Absolute: 0.6 10*3/uL (ref 0.0–0.7)
Eosinophils Relative: 5 % (ref 0–5)
HEMATOCRIT: 42.5 % (ref 36.0–46.0)
Hemoglobin: 14.4 g/dL (ref 12.0–15.0)
LYMPHS ABS: 2.2 10*3/uL (ref 0.7–4.0)
Lymphocytes Relative: 20 % (ref 12–46)
MCH: 30.3 pg (ref 26.0–34.0)
MCHC: 33.9 g/dL (ref 30.0–36.0)
MCV: 89.5 fL (ref 78.0–100.0)
MONO ABS: 0.8 10*3/uL (ref 0.1–1.0)
MONOS PCT: 7 % (ref 3–12)
NEUTROS ABS: 7.3 10*3/uL (ref 1.7–7.7)
Neutrophils Relative %: 66 % (ref 43–77)
Platelets: 530 10*3/uL — ABNORMAL HIGH (ref 150–400)
RBC: 4.75 MIL/uL (ref 3.87–5.11)
RDW: 14.6 % (ref 11.5–15.5)
WBC: 11 10*3/uL — ABNORMAL HIGH (ref 4.0–10.5)

## 2014-05-04 LAB — COMPLETE METABOLIC PANEL WITH GFR
ALBUMIN: 3.9 g/dL (ref 3.5–5.2)
ALT: 15 U/L (ref 0–35)
AST: 26 U/L (ref 0–37)
Alkaline Phosphatase: 125 U/L — ABNORMAL HIGH (ref 39–117)
BUN: 17 mg/dL (ref 6–23)
CO2: 28 mEq/L (ref 19–32)
Calcium: 9.7 mg/dL (ref 8.4–10.5)
Chloride: 99 mEq/L (ref 96–112)
Creat: 0.49 mg/dL — ABNORMAL LOW (ref 0.50–1.10)
GLUCOSE: 94 mg/dL (ref 70–99)
POTASSIUM: 4.4 meq/L (ref 3.5–5.3)
SODIUM: 138 meq/L (ref 135–145)
Total Bilirubin: 0.5 mg/dL (ref 0.2–1.2)
Total Protein: 7.2 g/dL (ref 6.0–8.3)

## 2014-05-04 NOTE — Progress Notes (Signed)
Subjective:    Patient ID: [name], female    DOB: 1941-12-03, 73 y.o.   MRN: [id number]  HPI  Patient was recently admitted to the hospital.  I have copied the d/c summary below for reference.  Admit date: 03/22/2014  Discharge date: 03/25/2014  Recommendations for Outpatient Follow-up:  1. Resolution of pneumonia. 2. Followup COPD, bronchiectasis. 3. Consider repeat CBC as an outpatient followup leukocytosis 4. Followup severe malnutrition Follow-up Information    Follow up with [facility] TOM, MD In 2 weeks.    Specialty: Family Medicine    Contact information:    [address]  [place] [address]  [phone]       Follow up with HAWKINS,EDWARD L, MD. Schedule an appointment as soon as possible for a visit in 1 week.    Specialty: Pulmonary Disease    Contact information:    [place]  [place] [address]  [phone]     Discharge Diagnoses:  1. Bilateral pneumonia 2. Acute hypoxic respiratory failure 3. COPD 4. Bronchiectasis 5. Severe malnutrition in context of chronic illness Discharge Condition: Improved  Disposition: Home  Diet recommendation: Regular  Filed Weights    03/22/14 0614  03/22/14 0858  03/22/14 0933   Weight:  53.524 kg (118 lb)  48.2 kg (106 lb 4.2 oz)  49 kg (108 lb 0.4 oz)   History of present illness:  73 year old woman with history of COPD, former smoker, bronchiectasis presented to the emergency department with increasing dyspnea on exertion, productive cough and shortness of breath. Initial evaluation suggested bilateral pneumonia and mild hypoxia in patient was referred for admission.  Hospital Course:  Patient was treated with broad-spectrum antibiotics cefepime, azithromycin with gradual improvement and resolution of hypoxic respiratory failure. She was seen in consultation with pulmonology. Hospitalization was uncomplicated, now stable for discharge, complete oral antibiotics as an  outpatient and followup with pulmonology. Individual issues as below.  1. Bilateral pneumonia. Appears clinically resolved. Remains afebrile, nontoxic.  2. Acute hypoxic respiratory failure, resolved. Secondary to pneumonia, component of COPD. 3. COPD. CT chest 01/2014 suggested interstitial scarring, chronic bronchitis, persistent bronchiectasis, marked emphysema. Currently appears stable.  4. Severe malnutrition in context of chronic illness Consultants:  Pulmonology Procedures: none  Antibiotics:  Azithromycin 4/15 >> 4/19  Ciprofloxacin 4/18 >> 4/24  Cefepime 4/15 >> 4/18  Vancomycin 4/15 >> 4/16 Discharge Instructions  Discharge Orders    Future Orders  Complete By  Expires    Activity as tolerated - No restrictions  As directed     Diet general  As directed     Discharge instructions  As directed         Medication List         ciprofloxacin 750 MG tablet    Commonly known as: CIPRO    Take 1 tablet (750 mg total) by mouth 2 (two) times daily.    Start taking on: 03/26/2014    Fluticasone-Salmeterol 250-50 MCG/DOSE Aepb    Commonly known as: ADVAIR    Inhale 1 puff into the lungs 2 (two) times daily.    ipratropium 0.02 % nebulizer solution    Commonly known as: ATROVENT    Take 2.5 mLs (500 mcg total) by nebulization 2 (two) times daily as needed. For shortness of breath    ipratropium 17 MCG/ACT inhaler    Commonly known as: ATROVENT HFA    Inhale 2 puffs into the lungs every 6 (six)  hours.    zolpidem 10 MG tablet    Commonly known as: AMBIEN    Take 10 mg by mouth at bedtime.      Allergies   Allergen  Reactions   .  Naproxen Sodium  Nausea And Vomiting   .  Symbicort [Budesonide-Formoterol Fumarate]  Shortness Of Breath     Breathing difficulty   .  Alendronate Sodium  Other (See Comments)     Kidney failure   .  Boniva [Ibandronate Sodium]  Other (See Comments)     Kidney failure   .  Risedronate Sodium    .  Zetia [Ezetimibe]  Other (See Comments)      Muscle pain    The results of significant diagnostics from this hospitalization (including imaging, microbiology, ancillary and laboratory) are listed below for reference.   Significant Diagnostic Studies:  Dg Chest Portable 1 View  03/22/2014 CLINICAL DATA: Cough, nasal congestion. EXAM: PORTABLE CHEST - 1 VIEW COMPARISON: CT CHEST W/CM dated 01/19/2014; DG CHEST 2V dated 01/16/2014 FINDINGS: Bibasilar patchy alveolar airspace opacities in a background of chronic interstitial changes and increased lung volumes. Possible small pleural effusions. Cardiac silhouette is unremarkable. Mildly calcified aortic knob, mediastinal silhouette is otherwise unremarkable. Nodular densities again seen in lung apices bilaterally. No pneumothorax though lung apices are obscured by overlying facial structures. Osteopenia. Soft tissue planes are nonsuspicious. IMPRESSION: COPD with superimposed bibasilar consolidations concerning for pneumonia with suspected small pleural effusions. Small apical nodules. Recommend followup chest radiograph after treatment to verify improvement. Electronically Signed By: Elon Alas On: 03/22/2014 06:40    04/03/14 She has completed 10 days of antibiotics. She no longer has fever. Her cough is much better. Her cough is no longer productive. She is using Advair twice a day. She is using Atrovent once a day. Her breathing is back to her baseline. She denies any pleurisy or hemoptysis.  At that time, my plan was: Patient had Pneumovax 23. Today also gave her a booster for Prevnar 13. Clinically her pneumonia seems resolved. I recommended that she follow up in one month and at that time we will repeat a chest x-ray to insure complete resolution of the bibasilar consolidations. Not given any further antibiotics required at the present time. I recommended she continue Advair. I recommended that we also add spiriva 1 inh qday.  I am hoping that this will help improve her stamina and shortness of  breath. I gave the patient samples. She will call me with an update in a couple weeks.    05/04/14 Patient is here today for follow up.  She has only gained 1 lb since last office visit.  She quit spiriva after only a few days due to constipation and dark urine/urinary retention.  She then went back to Atrovent. She quit Atrovent because she perceived it was increasing mucus in her airways.  Atrovent was not causing any constipation or urinary retention She has a history of stopping meds prematurely due to perceived side effects.    Past Medical History  Diagnosis Date  . Osteoporosis   . Hyperlipidemia   . COPD (chronic obstructive pulmonary disease)   . Chronic UTI   . BV (bacterial vaginosis)   . FH: cataracts    Past Surgical History  Procedure Laterality Date  . Fracture surgery      rt left knee cap  . Colonoscopy  04/19/2012    Procedure: COLONOSCOPY;  Surgeon: Danie Binder, MD;  Location: [facility];  Service:  Endoscopy;  Laterality: N/A;  11:30 AM   Current Outpatient Prescriptions on File Prior to Visit  Medication Sig Dispense Refill  . Fluticasone-Salmeterol (ADVAIR) 250-50 MCG/DOSE AEPB Inhale 1 puff into the lungs 2 (two) times daily.      Marland Kitchen ipratropium (ATROVENT HFA) 17 MCG/ACT inhaler Inhale 2 puffs into the lungs every 6 (six) hours.  1 Inhaler  11  . ipratropium (ATROVENT) 0.02 % nebulizer solution Take 2.5 mLs (500 mcg total) by nebulization 2 (two) times daily as needed. For shortness of breath  75 mL  5  . zolpidem (AMBIEN) 10 MG tablet Take 10 mg by mouth at bedtime.       No current facility-administered medications on file prior to visit.   Allergies  Allergen Reactions  . Naproxen Sodium Nausea And Vomiting  . Symbicort [Budesonide-Formoterol Fumarate] Shortness Of Breath    Breathing difficulty  . Alendronate Sodium Other (See Comments)    Kidney failure  . Boniva [Ibandronate Sodium] Other (See Comments)    Kidney failure   . Risedronate Sodium     . Zetia [Ezetimibe] Other (See Comments)    Muscle pain   History   Social History  . Marital Status: Married    Spouse Name: N/A    Number of Children: N/A  . Years of Education: N/A   Occupational History  . Not on file.   Social History Main Topics  . Smoking status: Former Research scientist (life sciences)  . Smokeless tobacco: Never Used  . Alcohol Use: No  . Drug Use: No  . Sexual Activity: Not on file     Comment: married, husband smokes.   Other Topics Concern  . Not on file   Social History Narrative  . No narrative on file      Review of Systems  All other systems reviewed and are negative.      Objective:   Physical Exam  Vitals reviewed. Constitutional: She appears well-developed and well-nourished.  HENT:  Right Ear: External ear normal.  Left Ear: External ear normal.  Nose: Nose normal.  Mouth/Throat: Oropharynx is clear and moist. No oropharyngeal exudate.  Eyes: Conjunctivae are normal.  Neck: Neck supple. No JVD present. No thyromegaly present.  Cardiovascular: Normal rate, regular rhythm and normal heart sounds.   No murmur heard. Pulmonary/Chest: Effort normal. She has decreased breath sounds. She has no wheezes. She has no rhonchi. She has no rales.  Lymphadenopathy:    She has no cervical adenopathy.          Assessment & Plan:  1. COPD (chronic obstructive pulmonary disease) Continue advair bid.  Add atrovent 2 puff inh bid to tid.  Repeat CXR to ensure complete resolution of bibasilar opacities. - CBC with Differential - COMPLETE METABOLIC PANEL WITH GFR - DG Chest 2 View; Future  2. Unspecified protein-calorie malnutrition Add ensure bid with breakfast and supper.

## 2014-05-09 ENCOUNTER — Ambulatory Visit (HOSPITAL_COMMUNITY)
Admission: RE | Admit: 2014-05-09 | Discharge: 2014-05-09 | Disposition: A | Discharge status: Home / Self Care | Payer: Medicare Other | Source: Ambulatory Visit | Attending: Family Medicine | Admitting: Family Medicine

## 2014-05-09 DIAGNOSIS — J449 Chronic obstructive pulmonary disease, unspecified: Secondary | ICD-10-CM

## 2014-05-09 DIAGNOSIS — J4489 Other specified chronic obstructive pulmonary disease: Secondary | ICD-10-CM | POA: Insufficient documentation

## 2014-06-16 ENCOUNTER — Telehealth: Payer: Self-pay | Admitting: Family Medicine

## 2014-06-16 MED ORDER — TIOTROPIUM BROMIDE MONOHYDRATE 18 MCG IN CAPS: 18.0000 ug | ORAL_CAPSULE | Freq: Every day | RESPIRATORY_TRACT | Status: DC

## 2014-06-16 NOTE — Telephone Encounter (Signed)
Sent med to pharm and pt aware

## 2014-06-16 NOTE — Telephone Encounter (Signed)
Message copied by Alyson Locket on Fri Jun 16, 2014  3:31 PM ------      Message from: Lenore Manner      Created: Fri Jun 16, 2014  3:21 PM      Regarding: med      Contact: [phone]       PT states that Dr Dennard Schaumann gave her samples of sparvia and it is working well, she finally got it to work and her lungs are getting stronger she is wanting to know if she could have a prescription sent to [facility] in [place].  ------

## 2014-07-01 ENCOUNTER — Other Ambulatory Visit: Payer: Self-pay | Admitting: Family Medicine

## 2014-07-01 NOTE — Telephone Encounter (Signed)
?   Ok to refill,  Last ov 05/04/14

## 2014-07-03 NOTE — Telephone Encounter (Signed)
ok 

## 2014-07-03 NOTE — Telephone Encounter (Signed)
Med phoned in °

## 2014-07-20 ENCOUNTER — Other Ambulatory Visit: Payer: Self-pay | Admitting: Family Medicine

## 2014-07-20 DIAGNOSIS — Z1231 Encounter for screening mammogram for malignant neoplasm of breast: Secondary | ICD-10-CM

## 2014-07-29 ENCOUNTER — Telehealth: Payer: Self-pay | Admitting: *Deleted

## 2014-07-29 NOTE — Telephone Encounter (Signed)
Received fax requesting refill on Ambien.   Ok to refill??  Last office visit 05/04/2014.  Last refill 07/03/2014.

## 2014-07-31 MED ORDER — ZOLPIDEM TARTRATE 10 MG PO TABS: ORAL_TABLET | ORAL | Status: DC

## 2014-07-31 NOTE — Telephone Encounter (Signed)
ok 

## 2014-07-31 NOTE — Telephone Encounter (Signed)
Medication called to pharmacy. 

## 2014-08-21 ENCOUNTER — Telehealth: Payer: Self-pay | Admitting: Family Medicine

## 2014-08-21 MED ORDER — SUVOREXANT 10 MG PO TABS: 10.0000 mg | ORAL_TABLET | Freq: Every day | ORAL | Status: DC

## 2014-08-21 NOTE — Telephone Encounter (Signed)
Insurance will not cover Ambien but will cover Trazodone or Belsomra. Per [facility] ok to do Belsomra 10mg  1 tab po 30 mins prior to bedtime prn. #30/3 refills. Med called to [place] [facility] and pt aware.

## 2014-08-25 ENCOUNTER — Ambulatory Visit (HOSPITAL_COMMUNITY)
Admission: RE | Admit: 2014-08-25 | Discharge: 2014-08-25 | Disposition: A | Discharge status: Home / Self Care | Payer: Medicare Other | Source: Ambulatory Visit | Attending: Family Medicine | Admitting: Family Medicine

## 2014-08-25 DIAGNOSIS — Z1231 Encounter for screening mammogram for malignant neoplasm of breast: Secondary | ICD-10-CM

## 2014-09-12 ENCOUNTER — Other Ambulatory Visit: Payer: Medicare Other

## 2014-09-12 DIAGNOSIS — E785 Hyperlipidemia, unspecified: Secondary | ICD-10-CM

## 2014-09-12 DIAGNOSIS — J449 Chronic obstructive pulmonary disease, unspecified: Secondary | ICD-10-CM

## 2014-09-12 DIAGNOSIS — R000 Tachycardia, unspecified: Secondary | ICD-10-CM

## 2014-09-12 DIAGNOSIS — Z79899 Other long term (current) drug therapy: Secondary | ICD-10-CM

## 2014-09-12 DIAGNOSIS — E43 Unspecified severe protein-calorie malnutrition: Secondary | ICD-10-CM

## 2014-09-12 DIAGNOSIS — Z0000 Encounter for general adult medical examination without abnormal findings: Secondary | ICD-10-CM

## 2014-09-12 DIAGNOSIS — M810 Age-related osteoporosis without current pathological fracture: Secondary | ICD-10-CM

## 2014-09-12 LAB — COMPLETE METABOLIC PANEL WITH GFR
ALK PHOS: 130 U/L — AB (ref 39–117)
ALT: 16 U/L (ref 0–35)
AST: 27 U/L (ref 0–37)
Albumin: 4.3 g/dL (ref 3.5–5.2)
BUN: 15 mg/dL (ref 6–23)
CO2: 29 mEq/L (ref 19–32)
CREATININE: 0.58 mg/dL (ref 0.50–1.10)
Calcium: 9.6 mg/dL (ref 8.4–10.5)
Chloride: 100 mEq/L (ref 96–112)
GFR, Est Non African American: >89 mL/min
Glucose, Bld: 89 mg/dL (ref 70–99)
Potassium: 4.8 mEq/L (ref 3.5–5.3)
Sodium: 138 mEq/L (ref 135–145)
Total Bilirubin: 0.6 mg/dL (ref 0.2–1.2)
Total Protein: 7.6 g/dL (ref 6.0–8.3)

## 2014-09-12 LAB — LIPID PANEL
CHOL/HDL RATIO: 3.5 ratio
CHOLESTEROL: 234 mg/dL — AB (ref 0–200)
HDL: 67 mg/dL (ref >39)
LDL Cholesterol: 146 mg/dL — ABNORMAL HIGH (ref 0–99)
TRIGLYCERIDES: 104 mg/dL (ref <150)
VLDL: 21 mg/dL (ref 0–40)

## 2014-09-12 LAB — CBC WITH DIFFERENTIAL/PLATELET
Basophils Absolute: 0.1 10*3/uL (ref 0.0–0.1)
Basophils Relative: 1 % (ref 0–1)
EOS ABS: 0.4 10*3/uL (ref 0.0–0.7)
Eosinophils Relative: 4 % (ref 0–5)
HCT: 44.2 % (ref 36.0–46.0)
Hemoglobin: 14.8 g/dL (ref 12.0–15.0)
Lymphocytes Relative: 30 % (ref 12–46)
Lymphs Abs: 3 10*3/uL (ref 0.7–4.0)
MCH: 30.3 pg (ref 26.0–34.0)
MCHC: 33.5 g/dL (ref 30.0–36.0)
MCV: 90.4 fL (ref 78.0–100.0)
Monocytes Absolute: 0.8 10*3/uL (ref 0.1–1.0)
Monocytes Relative: 8 % (ref 3–12)
NEUTROS PCT: 57 % (ref 43–77)
Neutro Abs: 5.6 10*3/uL (ref 1.7–7.7)
Platelets: 478 10*3/uL — ABNORMAL HIGH (ref 150–400)
RBC: 4.89 MIL/uL (ref 3.87–5.11)
RDW: 15.7 % — ABNORMAL HIGH (ref 11.5–15.5)
WBC: 9.9 10*3/uL (ref 4.0–10.5)

## 2014-09-12 LAB — TSH: TSH: 1.122 u[IU]/mL (ref 0.350–4.500)

## 2014-09-13 LAB — VITAMIN D 25 HYDROXY (VIT D DEFICIENCY, FRACTURES): VIT D 25 HYDROXY: 31 ng/mL (ref 30–89)

## 2014-09-18 ENCOUNTER — Telehealth: Payer: Self-pay | Admitting: Family Medicine

## 2014-09-18 MED ORDER — TRAZODONE HCL 50 MG PO TABS: 50.0000 mg | ORAL_TABLET | Freq: Every day | ORAL | Status: DC

## 2014-09-18 NOTE — Telephone Encounter (Signed)
She should be taking Belsomra and her insurance will pay for that and Trazodone. Do you want to switch her to Trazodone or I can resubmit a PA for Ambien?

## 2014-09-18 NOTE — Telephone Encounter (Signed)
Switch from belsomra to trazadone 50 mg poqhs prn insomnia.  Recheck in 2 weeks

## 2014-09-18 NOTE — Telephone Encounter (Signed)
LMTRC

## 2014-09-18 NOTE — Telephone Encounter (Signed)
[phone]  Pt states that the sleep aid medication is not working (she told me the name but I couldn't understand)

## 2014-09-18 NOTE — Telephone Encounter (Signed)
Med sent to pharm and pt aware 

## 2014-09-20 ENCOUNTER — Other Ambulatory Visit: Payer: Self-pay | Admitting: Family Medicine

## 2014-09-20 MED ORDER — TRAZODONE HCL 50 MG PO TABS: 50.0000 mg | ORAL_TABLET | Freq: Every day | ORAL | Status: DC

## 2014-09-22 ENCOUNTER — Encounter: Payer: Medicare Other | Admitting: Family Medicine

## 2014-10-12 ENCOUNTER — Ambulatory Visit (INDEPENDENT_AMBULATORY_CARE_PROVIDER_SITE_OTHER): Payer: Medicare Other | Admitting: Family Medicine

## 2014-10-12 ENCOUNTER — Encounter: Payer: Self-pay | Admitting: Family Medicine

## 2014-10-12 VITALS — BP 160/88 | HR 78 | Temp 98.0°F | Resp 22 | Ht 63.0 in | Wt 120.0 lb

## 2014-10-12 DIAGNOSIS — Z23 Encounter for immunization: Secondary | ICD-10-CM

## 2014-10-12 DIAGNOSIS — Z0000 Encounter for general adult medical examination without abnormal findings: Secondary | ICD-10-CM

## 2014-10-12 DIAGNOSIS — G4700 Insomnia, unspecified: Secondary | ICD-10-CM

## 2014-10-12 MED ORDER — ALPRAZOLAM 0.5 MG PO TABS: 0.5000 mg | ORAL_TABLET | Freq: Every evening | ORAL | Status: DC | PRN

## 2014-10-12 NOTE — Progress Notes (Signed)
Subjective:    Patient ID: [name], female    DOB: Jun 12, 1941, 73 y.o.   MRN: [id number]  HPI  Patient is a very pleasant 73 year old white female who is here today for complete physical exam. Her mammogram was performed in September and was normal. Her colonoscopy was performed in 2013 and is up-to-date. She is overdue for a Pap smear but she declines that today. Patient's  Pneumovax and Prevnar 13 are up-to-date. She is due for a flu shot. She has a past medical history of osteoporosis. We have tried bisphosphonates which she was unable to tolerate. We tried Evista which she was unable to tolerate. We even tried prolia but the patient could not tolerate that medication either. She is overdue for repeat bone density. Her blood pressure today is extremely elevated at 160/88. However the patient has had her blood pressure checked at home several times recently and all of her blood pressures been normal. She is even had it checked by home health nurse and it was normal. She states that she is nervous today because of her physical. Lab on 09/12/2014  Component Date Value Ref Range Status  . Sodium 09/12/2014 138  135 - 145 mEq/L Final  . Potassium 09/12/2014 4.8  3.5 - 5.3 mEq/L Final  . Chloride 09/12/2014 100  96 - 112 mEq/L Final  . CO2 09/12/2014 29  19 - 32 mEq/L Final  . Glucose, Bld 09/12/2014 89  70 - 99 mg/dL Final  . BUN 09/12/2014 15  6 - 23 mg/dL Final  . Creat 09/12/2014 0.58  0.50 - 1.10 mg/dL Final  . Total Bilirubin 09/12/2014 0.6  0.2 - 1.2 mg/dL Final  . Alkaline Phosphatase 09/12/2014 130* 39 - 117 U/L Final  . AST 09/12/2014 27  0 - 37 U/L Final  . ALT 09/12/2014 16  0 - 35 U/L Final  . Total Protein 09/12/2014 7.6  6.0 - 8.3 g/dL Final  . Albumin 09/12/2014 4.3  3.5 - 5.2 g/dL Final  . Calcium 09/12/2014 9.6  8.4 - 10.5 mg/dL Final  . GFR, Est African American 09/12/2014 >89   Final  . GFR, Est Non African American 09/12/2014 >89   Final   Comment:                 The estimated GFR is a calculation valid for adults (>=47 years old)                          that uses the CKD-EPI algorithm to adjust for age and sex. It is                            not to be used for children, pregnant women, hospitalized patients,                             patients on dialysis, or with rapidly changing kidney function.                          According to the NKDEP, eGFR >89 is normal, 60-89 shows mild                          impairment, 30-59 shows moderate impairment, 15-29 shows severe  impairment and <15 is ESRD.                             . WBC 09/12/2014 9.9  4.0 - 10.5 K/uL Final  . RBC 09/12/2014 4.89  3.87 - 5.11 MIL/uL Final  . Hemoglobin 09/12/2014 14.8  12.0 - 15.0 g/dL Final  . HCT 09/12/2014 44.2  36.0 - 46.0 % Final  . MCV 09/12/2014 90.4  78.0 - 100.0 fL Final  . MCH 09/12/2014 30.3  26.0 - 34.0 pg Final  . MCHC 09/12/2014 33.5  30.0 - 36.0 g/dL Final  . RDW 09/12/2014 15.7* 11.5 - 15.5 % Final  . Platelets 09/12/2014 478* 150 - 400 K/uL Final  . Neutrophils Relative % 09/12/2014 57  43 - 77 % Final  . Neutro Abs 09/12/2014 5.6  1.7 - 7.7 K/uL Final  . Lymphocytes Relative 09/12/2014 30  12 - 46 % Final  . Lymphs Abs 09/12/2014 3.0  0.7 - 4.0 K/uL Final  . Monocytes Relative 09/12/2014 8  3 - 12 % Final  . Monocytes Absolute 09/12/2014 0.8  0.1 - 1.0 K/uL Final  . Eosinophils Relative 09/12/2014 4  0 - 5 % Final  . Eosinophils Absolute 09/12/2014 0.4  0.0 - 0.7 K/uL Final  . Basophils Relative 09/12/2014 1  0 - 1 % Final  . Basophils Absolute 09/12/2014 0.1  0.0 - 0.1 K/uL Final  . Smear Review 09/12/2014 Criteria for review not met   Final  . Cholesterol 09/12/2014 234* 0 - 200 mg/dL Final   Comment: ATP III Classification:                                < 200        mg/dL        Desirable                               200 - 239     mg/dL        Borderline High                               >= 240         mg/dL        High                             . Triglycerides 09/12/2014 104  <150 mg/dL Final  . HDL 09/12/2014 67  >39 mg/dL Final  . Total CHOL/HDL Ratio 09/12/2014 3.5   Final  . VLDL 09/12/2014 21  0 - 40 mg/dL Final  . LDL Cholesterol 09/12/2014 146* 0 - 99 mg/dL Final   Comment:                            Total Cholesterol/HDL Ratio:CHD Risk                                                 Coronary Heart Disease Risk Table  Men       Women                                   1/2 Average Risk              3.4        3.3                                       Average Risk              5.0        4.4                                    2X Average Risk              9.6        7.1                                    3X Average Risk             23.4       11.0                          Use the calculated Patient Ratio above and the CHD Risk table                           to determine the patient's CHD Risk.                          ATP III Classification (LDL):                                < 100        mg/dL         Optimal                               100 - 129     mg/dL         Near or Above Optimal                               130 - 159     mg/dL         Borderline High                               160 - 189     mg/dL         High                                > 190        mg/dL         Very High                             .  TSH 09/12/2014 1.122  0.350 - 4.500 uIU/mL Final  . Vit D, 25-Hydroxy 09/12/2014 31  30 - 89 ng/mL Final   Comment: This assay accurately quantifies Vitamin D, which is the sum of the                          25-Hydroxy forms of Vitamin D2 and D3.  Studies have shown that the                          optimum concentration of 25-Hydroxy Vitamin D is 30 ng/mL or higher.                           Concentrations of Vitamin D between 20 and 29 ng/mL are considered to                          be  insufficient and concentrations less than 20 ng/mL are considered                          to be deficient for Vitamin D.   Past Medical History  Diagnosis Date  . Osteoporosis   . Hyperlipidemia   . COPD (chronic obstructive pulmonary disease)   . Chronic UTI   . BV (bacterial vaginosis)   . FH: cataracts    Past Surgical History  Procedure Laterality Date  . Fracture surgery      rt left knee cap  . Colonoscopy  04/19/2012    Procedure: COLONOSCOPY;  Surgeon: Danie Binder, MD;  Location: [facility];  Service: Endoscopy;  Laterality: N/A;  11:30 AM   Current Outpatient Prescriptions on File Prior to Visit  Medication Sig Dispense Refill  . Fluticasone-Salmeterol (ADVAIR) 250-50 MCG/DOSE AEPB Inhale 1 puff into the lungs 2 (two) times daily.    Marland Kitchen ipratropium (ATROVENT HFA) 17 MCG/ACT inhaler Inhale 2 puffs into the lungs every 6 (six) hours. 1 Inhaler 11  . ipratropium (ATROVENT) 0.02 % nebulizer solution Take 2.5 mLs (500 mcg total) by nebulization 2 (two) times daily as needed. For shortness of breath 75 mL 5  . tiotropium (SPIRIVA HANDIHALER) 18 MCG inhalation capsule Place 1 capsule (18 mcg total) into inhaler and inhale daily. 30 capsule 5  . traZODone (DESYREL) 50 MG tablet Take 1 tablet (50 mg total) by mouth at bedtime. 30 tablet 3   No current facility-administered medications on file prior to visit.   Allergies  Allergen Reactions  . Naproxen Sodium Nausea And Vomiting  . Symbicort [Budesonide-Formoterol Fumarate] Shortness Of Breath    Breathing difficulty  . Alendronate Sodium Other (See Comments)    Kidney failure  . Boniva [Ibandronate Sodium] Other (See Comments)    Kidney failure   . Risedronate Sodium   . Zetia [Ezetimibe] Other (See Comments)    Muscle pain   History   Social History  . Marital Status: Married    Spouse Name: N/A    Number of Children: N/A  . Years of Education: N/A   Occupational History  . Not on file.   Social History  Main Topics  . Smoking status: Former Research scientist (life sciences)  . Smokeless tobacco: Never Used  . Alcohol Use: No  . Drug Use: No  . Sexual Activity: Not on file     Comment: married, husband smokes.  Other Topics Concern  . Not on file   Social History Narrative   Family History  Problem Relation Age of Onset  . Cancer Father     pancreatic     Review of Systems  All other systems reviewed and are negative.      Objective:   Physical Exam  Constitutional: She is oriented to person, place, and time. She appears well-developed and well-nourished. No distress.  HENT:  Head: Normocephalic and atraumatic.  Right Ear: External ear normal.  Left Ear: External ear normal.  Nose: Nose normal.  Mouth/Throat: Oropharynx is clear and moist. No oropharyngeal exudate.  Eyes: Conjunctivae and EOM are normal. Pupils are equal, round, and reactive to light. Right eye exhibits no discharge. Left eye exhibits no discharge. No scleral icterus.  Neck: Normal range of motion. Neck supple. No JVD present. No tracheal deviation present. No thyromegaly present.  Cardiovascular: Normal rate, regular rhythm and normal heart sounds.  Exam reveals no gallop and no friction rub.   No murmur heard. Pulmonary/Chest: Effort normal. No stridor. No respiratory distress. She has decreased breath sounds. She has no wheezes. She has no rales. She exhibits no tenderness.  Abdominal: Soft. Bowel sounds are normal. She exhibits no distension. There is no tenderness. There is no rebound and no guarding.  Musculoskeletal: Normal range of motion. She exhibits no edema or tenderness.  Lymphadenopathy:    She has no cervical adenopathy.  Neurological: She is alert and oriented to person, place, and time. She has normal reflexes. She displays normal reflexes. No cranial nerve deficit. She exhibits normal muscle tone. Coordination normal.  Skin: Skin is warm. No rash noted. She is not diaphoretic. No erythema. No pallor.    Psychiatric: She has a normal mood and affect. Her behavior is normal. Judgment and thought content normal.  Vitals reviewed.         Assessment & Plan:  Insomnia - Plan: ALPRAZolam (XANAX) 0.5 MG tablet  Routine general medical examination at a health care facility  Patient's blood pressure today is extremely elevated. I will have the patient return next week to have her blood pressure rechecked. If her blood pressure remains greater than 140/90, I will try the patient on amlodipine. I am very concerned about her osteoporosis. I recommended repeating dexa and reclast but she declines at the present time. Her cancer screening is otherwise up-to-date. I recommended a Pap smear. Patient elected to schedule this with a female provider. Her lab work is acceptable except for her cholesterol. I recommended fish oil 2 g by mouth daily. Also recommended she take 1200 mg a day of calcium and 1000 units a day of vitamin D. Patient received her flu shot today.

## 2014-10-12 NOTE — Addendum Note (Signed)
Addended by: Shary Decamp B on: 10/12/2014 04:41 PM   Modules accepted: Orders

## 2014-10-16 ENCOUNTER — Telehealth: Payer: Self-pay | Admitting: Family Medicine

## 2014-10-17 NOTE — Telephone Encounter (Signed)
Pt needs PA for Ambien - she has tried belsomra and trazodone that did not help her at all.  Called med to [place] [facility] and they will fax [place] a PA request form.

## 2014-10-18 NOTE — Telephone Encounter (Signed)
PA Submitted through [email or web address]  

## 2014-10-20 NOTE — Telephone Encounter (Signed)
Additional paperwork was sent by [employer] and it was filled out and faxed back

## 2014-10-27 NOTE — Telephone Encounter (Signed)
Ambien has been denied. Pharmacy aware.

## 2014-11-07 ENCOUNTER — Telehealth: Payer: Self-pay | Admitting: Family Medicine

## 2014-11-07 MED ORDER — FLUTICASONE-SALMETEROL 250-50 MCG/DOSE IN AEPB: 1.0000 | INHALATION_SPRAY | Freq: Two times a day (BID) | RESPIRATORY_TRACT | Status: DC

## 2014-11-07 NOTE — Telephone Encounter (Signed)
[phone] patient needs rx for her advair to go to [facility]

## 2014-11-07 NOTE — Telephone Encounter (Signed)
Med sent to pharm 

## 2014-12-21 ENCOUNTER — Telehealth: Payer: Self-pay | Admitting: Family Medicine

## 2014-12-21 DIAGNOSIS — G4700 Insomnia, unspecified: Secondary | ICD-10-CM

## 2014-12-21 NOTE — Telephone Encounter (Signed)
Patient is calling to say that the sleep med is working and would like to get called in if possible  [phone]

## 2014-12-22 MED ORDER — ALPRAZOLAM 0.5 MG PO TABS: 0.5000 mg | ORAL_TABLET | Freq: Every evening | ORAL | Status: DC | PRN

## 2014-12-22 NOTE — Telephone Encounter (Signed)
rx called in

## 2014-12-22 NOTE — Telephone Encounter (Signed)
ok 

## 2014-12-22 NOTE — Telephone Encounter (Signed)
Last Rf Alprazolam 11/5 #30 + 2  OK refill?

## 2015-01-11 ENCOUNTER — Ambulatory Visit (INDEPENDENT_AMBULATORY_CARE_PROVIDER_SITE_OTHER): Payer: Medicare Other | Admitting: Family Medicine

## 2015-01-11 ENCOUNTER — Encounter: Payer: Self-pay | Admitting: Family Medicine

## 2015-01-11 VITALS — BP 140/78 | HR 76 | Temp 98.0°F | Resp 20 | Ht 63.0 in | Wt 123.0 lb

## 2015-01-11 DIAGNOSIS — G4700 Insomnia, unspecified: Secondary | ICD-10-CM | POA: Diagnosis not present

## 2015-01-11 MED ORDER — ZOLPIDEM TARTRATE 10 MG PO TABS: 10.0000 mg | ORAL_TABLET | Freq: Every evening | ORAL | Status: DC | PRN

## 2015-01-11 NOTE — Progress Notes (Signed)
Subjective:    Patient ID: [name], female    DOB: 02/23/1941, 74 y.o.   MRN: [id number]  HPI   patient's insurance recently refused to cover Ambien. Therefore we switch the patient to Xanax 0.5 mg by mouth daily at bedtime as needed for insomnia. Since making the switch, the patient feels extremely tired and sluggish during the day. She states that the medicine does not wear often leaves her feeling lightheaded and fatigued. She would like to go back on Ambien. Past Medical History  Diagnosis Date  . Osteoporosis   . Hyperlipidemia   . COPD (chronic obstructive pulmonary disease)   . Chronic UTI   . BV (bacterial vaginosis)   . FH: cataracts    Past Surgical History  Procedure Laterality Date  . Fracture surgery      rt left knee cap  . Colonoscopy  04/19/2012    Procedure: COLONOSCOPY;  Surgeon: Danie Binder, MD;  [facility]: [facility];  Service: Endoscopy;  Laterality: N/A;  11:30 AM   Current Outpatient Prescriptions on File Prior to Visit  Medication Sig Dispense Refill  . Fluticasone-Salmeterol (ADVAIR) 250-50 MCG/DOSE AEPB Inhale 1 puff into the lungs 2 (two) times daily. 60 each 11  . ipratropium (ATROVENT HFA) 17 MCG/ACT inhaler Inhale 2 puffs into the lungs every 6 (six) hours. 1 Inhaler 11  . ipratropium (ATROVENT) 0.02 % nebulizer solution Take 2.5 mLs (500 mcg total) by nebulization 2 (two) times daily as needed. For shortness of breath 75 mL 5  . tiotropium (SPIRIVA HANDIHALER) 18 MCG inhalation capsule Place 1 capsule (18 mcg total) into inhaler and inhale daily. 30 capsule 5  . ALPRAZolam (XANAX) 0.5 MG tablet Take 1 tablet (0.5 mg total) by mouth at bedtime as needed for sleep. (Patient not taking: Reported on 01/11/2015) 30 tablet 2   No current facility-administered medications on file prior to visit.   Allergies  Allergen Reactions  . Naproxen Sodium Nausea And Vomiting  . Symbicort [Budesonide-Formoterol Fumarate] Shortness Of Breath    Breathing  difficulty  . Alendronate Sodium Other (See Comments)    Kidney failure  . Boniva [Ibandronate Sodium] Other (See Comments)    Kidney failure   . Risedronate Sodium   . Zetia [Ezetimibe] Other (See Comments)    Muscle pain   History   Social History  . Marital Status: Married    Spouse Name: N/A    Number of Children: N/A  . Years of Education: N/A   Occupational History  . Not on file.   Social History Main Topics  . Smoking status: Former Research scientist (life sciences)  . Smokeless tobacco: Never Used  . Alcohol Use: No  . Drug Use: No  . Sexual Activity: Not on file     Comment: married, husband smokes.   Other Topics Concern  . Not on file   Social History Narrative     Review of Systems  All other systems reviewed and are negative.      Objective:   Physical Exam  Constitutional: She is oriented to person, place, and time. She appears well-developed and well-nourished.  Cardiovascular: Normal rate, regular rhythm and normal heart sounds.   Pulmonary/Chest: Effort normal and breath sounds normal.  Neurological: She is alert and oriented to person, place, and time. She has normal reflexes. She displays normal reflexes. No cranial nerve deficit. She exhibits normal muscle tone. Coordination normal.  Vitals reviewed.         Assessment & Plan:  Insomnia   Discontinue Xanax and resume Ambien 10 mg by mouth daily at bedtime when ncessary nsomnia. Patient can get thisprescription for $15 at [facility]. I believe this to be the best alternative for this patient

## 2015-01-26 ENCOUNTER — Encounter: Payer: Self-pay | Admitting: Family Medicine

## 2015-01-26 ENCOUNTER — Ambulatory Visit (INDEPENDENT_AMBULATORY_CARE_PROVIDER_SITE_OTHER): Payer: Medicare Other | Admitting: Family Medicine

## 2015-01-26 VITALS — BP 118/70 | HR 100 | Temp 97.7°F | Resp 20 | Wt 121.0 lb

## 2015-01-26 DIAGNOSIS — N3000 Acute cystitis without hematuria: Secondary | ICD-10-CM

## 2015-01-26 DIAGNOSIS — R3915 Urgency of urination: Secondary | ICD-10-CM | POA: Diagnosis not present

## 2015-01-26 LAB — URINALYSIS, ROUTINE W REFLEX MICROSCOPIC
Bilirubin Urine: NEGATIVE
GLUCOSE, UA: NEGATIVE mg/dL
Ketones, ur: NEGATIVE mg/dL
Nitrite: POSITIVE — AB
Protein, ur: NEGATIVE mg/dL
Urobilinogen, UA: 0.2 mg/dL (ref 0.0–1.0)
pH: 5.5 (ref 5.0–8.0)

## 2015-01-26 LAB — URINALYSIS, MICROSCOPIC ONLY
Casts: NONE SEEN
Crystals: NONE SEEN

## 2015-01-26 MED ORDER — CIPROFLOXACIN HCL 500 MG PO TABS: 500.0000 mg | ORAL_TABLET | Freq: Two times a day (BID) | ORAL | Status: DC

## 2015-01-26 NOTE — Progress Notes (Signed)
   Subjective:    Patient ID: [name], female    DOB: 1941/02/22, 74 y.o.   MRN: [id number]  HPI Patient presents with 5 days of urinary urgency, frequency, hesitancy, and dysuria. She also has some lower abdominal discomfort. Urinalysis is significant for blood, leukocyte esterase, and nitrites. Past Medical History  Diagnosis Date  . Osteoporosis   . Hyperlipidemia   . COPD (chronic obstructive pulmonary disease)   . Chronic UTI   . BV (bacterial vaginosis)   . FH: cataracts    Past Surgical History  Procedure Laterality Date  . Fracture surgery      rt left knee cap  . Colonoscopy  04/19/2012    Procedure: COLONOSCOPY;  Surgeon: Danie Binder, MD;  [facility];  Service: Endoscopy;  Laterality: N/A;  11:30 AM   Current Outpatient Prescriptions on File Prior to Visit  Medication Sig Dispense Refill  . ALPRAZolam (XANAX) 0.5 MG tablet Take 1 tablet (0.5 mg total) by mouth at bedtime as needed for sleep. 30 tablet 2  . Fluticasone-Salmeterol (ADVAIR) 250-50 MCG/DOSE AEPB Inhale 1 puff into the lungs 2 (two) times daily. 60 each 11  . ipratropium (ATROVENT HFA) 17 MCG/ACT inhaler Inhale 2 puffs into the lungs every 6 (six) hours. 1 Inhaler 11  . ipratropium (ATROVENT) 0.02 % nebulizer solution Take 2.5 mLs (500 mcg total) by nebulization 2 (two) times daily as needed. For shortness of breath 75 mL 5  . tiotropium (SPIRIVA HANDIHALER) 18 MCG inhalation capsule Place 1 capsule (18 mcg total) into inhaler and inhale daily. 30 capsule 5  . zolpidem (AMBIEN) 10 MG tablet Take 1 tablet (10 mg total) by mouth at bedtime as needed for sleep. 30 tablet 1   No current facility-administered medications on file prior to visit.   Allergies  Allergen Reactions  . Naproxen Sodium Nausea And Vomiting  . Symbicort [Budesonide-Formoterol Fumarate] Shortness Of Breath    Breathing difficulty  . Alendronate Sodium Other (See Comments)    Kidney failure  . Boniva [Ibandronate  Sodium] Other (See Comments)    Kidney failure   . Risedronate Sodium   . Zetia [Ezetimibe] Other (See Comments)    Muscle pain   History   Social History  . Marital Status: Married    Spouse Name: N/A  . Number of Children: N/A  . Years of Education: N/A   Occupational History  . Not on file.   Social History Main Topics  . Smoking status: Former Research scientist (life sciences)  . Smokeless tobacco: Never Used  . Alcohol Use: No  . Drug Use: No  . Sexual Activity: Not on file     Comment: married, husband smokes.   Other Topics Concern  . Not on file   Social History Narrative      Review of Systems  All other systems reviewed and are negative.      Objective:   Physical Exam  Cardiovascular: Normal rate, regular rhythm and normal heart sounds.   Pulmonary/Chest: No respiratory distress. She has wheezes.  Abdominal: Soft. Bowel sounds are normal. She exhibits no distension. There is no tenderness. There is no rebound and no guarding.  Vitals reviewed. no CVAT        Assessment & Plan:  Urgency of urination - Plan: Urinalysis, Routine w reflex microscopic, ciprofloxacin (CIPRO) 500 MG tablet  Acute cystitis without hematuria   Begin cipro 500 pobid for 5 days.

## 2015-02-13 DIAGNOSIS — N3000 Acute cystitis without hematuria: Secondary | ICD-10-CM | POA: Diagnosis not present

## 2015-02-19 DIAGNOSIS — X32XXXD Exposure to sunlight, subsequent encounter: Secondary | ICD-10-CM | POA: Diagnosis not present

## 2015-02-19 DIAGNOSIS — D225 Melanocytic nevi of trunk: Secondary | ICD-10-CM | POA: Diagnosis not present

## 2015-02-19 DIAGNOSIS — L570 Actinic keratosis: Secondary | ICD-10-CM | POA: Diagnosis not present

## 2015-03-09 ENCOUNTER — Encounter: Payer: Self-pay | Admitting: Family Medicine

## 2015-03-09 ENCOUNTER — Ambulatory Visit (INDEPENDENT_AMBULATORY_CARE_PROVIDER_SITE_OTHER): Payer: Medicare Other | Admitting: Family Medicine

## 2015-03-09 VITALS — BP 130/60 | HR 110 | Temp 98.0°F | Resp 24 | Ht 63.0 in | Wt 118.0 lb

## 2015-03-09 DIAGNOSIS — J441 Chronic obstructive pulmonary disease with (acute) exacerbation: Secondary | ICD-10-CM

## 2015-03-09 MED ORDER — PREDNISONE 20 MG PO TABS: 40.0000 mg | ORAL_TABLET | Freq: Every day | ORAL | Status: DC

## 2015-03-09 MED ORDER — ALBUTEROL SULFATE (2.5 MG/3ML) 0.083% IN NEBU
2.5000 mg | INHALATION_SOLUTION | Freq: Once | RESPIRATORY_TRACT | Status: AC
  Administered 2015-03-09: 2.5 mg via RESPIRATORY_TRACT

## 2015-03-09 MED ORDER — LEVOFLOXACIN 500 MG PO TABS: 500.0000 mg | ORAL_TABLET | Freq: Every day | ORAL | Status: DC

## 2015-03-09 NOTE — Progress Notes (Signed)
Subjective:    Patient ID: [name], female    DOB: 1941-10-12, 74 y.o.   MRN: [id number]  HPI  Patient has a history of severe COPD. She is currently on Spiriva. She cannot tolerate Symbicort or medicine similar to Symbicort. She uses Atrovent 2 puffs inhaled every 6 hours when she develops an exacerbation. She does not feel that she benefits from albuterol at all. Over the last week she developed a cough productive of green mucus. She reports increasing shortness of breath. She reports wheezing. She reports dyspnea on exertion. She reports subjective fevers. On examination she has decreased airflow in all 4 lung fields. She has expiratory wheezing. She has a prolonged expiratory phase. There are no crackles Rales.. Past Medical History  Diagnosis Date  . Osteoporosis   . Hyperlipidemia   . COPD (chronic obstructive pulmonary disease)   . Chronic UTI   . BV (bacterial vaginosis)   . FH: cataracts    Past Surgical History  Procedure Laterality Date  . Fracture surgery      rt left knee cap  . Colonoscopy  04/19/2012    Procedure: COLONOSCOPY;  Surgeon: Danie Binder, MD;  [facility];  Service: Endoscopy;  Laterality: N/A;  11:30 AM   Current Outpatient Prescriptions on File Prior to Visit  Medication Sig Dispense Refill  . ALPRAZolam (XANAX) 0.5 MG tablet Take 1 tablet (0.5 mg total) by mouth at bedtime as needed for sleep. 30 tablet 2  . Fluticasone-Salmeterol (ADVAIR) 250-50 MCG/DOSE AEPB Inhale 1 puff into the lungs 2 (two) times daily. 60 each 11  . ipratropium (ATROVENT HFA) 17 MCG/ACT inhaler Inhale 2 puffs into the lungs every 6 (six) hours. 1 Inhaler 11  . ipratropium (ATROVENT) 0.02 % nebulizer solution Take 2.5 mLs (500 mcg total) by nebulization 2 (two) times daily as needed. For shortness of breath 75 mL 5  . tiotropium (SPIRIVA HANDIHALER) 18 MCG inhalation capsule Place 1 capsule (18 mcg total) into inhaler and inhale daily. 30 capsule 5   No current  facility-administered medications on file prior to visit.   Allergies  Allergen Reactions  . Naproxen Sodium Nausea And Vomiting  . Symbicort [Budesonide-Formoterol Fumarate] Shortness Of Breath    Breathing difficulty  . Alendronate Sodium Other (See Comments)    Kidney failure  . Boniva [Ibandronate Sodium] Other (See Comments)    Kidney failure   . Risedronate Sodium   . Zetia [Ezetimibe] Other (See Comments)    Muscle pain   History   Social History  . Marital Status: Married    Spouse Name: N/A  . Number of Children: N/A  . Years of Education: N/A   Occupational History  . Not on file.   Social History Main Topics  . Smoking status: Former Research scientist (life sciences)  . Smokeless tobacco: Never Used  . Alcohol Use: No  . Drug Use: No  . Sexual Activity: Not on file     Comment: married, husband smokes.   Other Topics Concern  . Not on file   Social History Narrative     Review of Systems  All other systems reviewed and are negative.      Objective:   Physical Exam  Constitutional: She appears well-developed and well-nourished. No distress.  Eyes: Conjunctivae are normal.  Neck: Neck supple.  Cardiovascular: Normal rate, regular rhythm and normal heart sounds.   No murmur heard. Pulmonary/Chest: Effort normal. No respiratory distress. She has decreased breath sounds. She has wheezes.  Lymphadenopathy:    She has no cervical adenopathy.  Skin: She is not diaphoretic.  Vitals reviewed.         Assessment & Plan:  COPD exacerbation - Plan: predniSONE (DELTASONE) 20 MG tablet, levofloxacin (LEVAQUIN) 500 MG tablet, albuterol (PROVENTIL) (2.5 MG/3ML) 0.083% nebulizer solution 2.5 mg  Patient is certainly having a COPD exacerbation. I will her to continue Spiriva. She can certainly use Atrovent 2 puffs inhaled every 6 hours as a rescue medicine. Begin Levaquin 500 mg by mouth daily for 7 days. Begin prednisone 40 mg by mouth daily for 7 days. I did give the patient a 2.5  mg albuterol neb today in office. She denies any symptomatic improvement and she does not want prescription for this medicine to take at home

## 2015-03-16 ENCOUNTER — Telehealth: Payer: Self-pay | Admitting: Family Medicine

## 2015-03-16 MED ORDER — TIOTROPIUM BROMIDE MONOHYDRATE 18 MCG IN CAPS: 18.0000 ug | ORAL_CAPSULE | Freq: Every day | RESPIRATORY_TRACT | Status: DC

## 2015-03-16 NOTE — Telephone Encounter (Signed)
Med sent to pharm 

## 2015-03-16 NOTE — Telephone Encounter (Signed)
[phone] PT is needing a refill on tiotropium (SPIRIVA HANDIHALER) 18 MCG inhalation capsule [employer]

## 2015-04-04 ENCOUNTER — Telehealth: Payer: Self-pay | Admitting: Family Medicine

## 2015-04-04 NOTE — Telephone Encounter (Signed)
[facility]  Patient would like refill on her zolpidem if possible  [phone] if any questions

## 2015-04-04 NOTE — Telephone Encounter (Signed)
?   OK to Refill  

## 2015-04-05 MED ORDER — ZOLPIDEM TARTRATE 10 MG PO TABS: 10.0000 mg | ORAL_TABLET | Freq: Every day | ORAL | Status: DC

## 2015-04-05 NOTE — Telephone Encounter (Signed)
ok 

## 2015-04-05 NOTE — Telephone Encounter (Signed)
Medication called/sent to requested pharmacy  

## 2015-05-14 ENCOUNTER — Ambulatory Visit (INDEPENDENT_AMBULATORY_CARE_PROVIDER_SITE_OTHER): Payer: Medicare Other | Admitting: Family Medicine

## 2015-05-14 ENCOUNTER — Encounter: Payer: Self-pay | Admitting: Family Medicine

## 2015-05-14 VITALS — BP 140/86 | HR 102 | Temp 97.5°F | Resp 18 | Ht 63.0 in | Wt 115.0 lb

## 2015-05-14 DIAGNOSIS — R300 Dysuria: Secondary | ICD-10-CM | POA: Diagnosis not present

## 2015-05-14 DIAGNOSIS — N3000 Acute cystitis without hematuria: Secondary | ICD-10-CM | POA: Diagnosis not present

## 2015-05-14 LAB — URINALYSIS, ROUTINE W REFLEX MICROSCOPIC
BILIRUBIN URINE: NEGATIVE
Glucose, UA: NEGATIVE mg/dL
HGB URINE DIPSTICK: NEGATIVE
Ketones, ur: NEGATIVE mg/dL
NITRITE: NEGATIVE
PH: 5 (ref 5.0–8.0)
Protein, ur: NEGATIVE mg/dL
Urobilinogen, UA: 0.2 mg/dL (ref 0.0–1.0)

## 2015-05-14 LAB — URINALYSIS, MICROSCOPIC ONLY
Casts: NONE SEEN
Crystals: NONE SEEN

## 2015-05-14 MED ORDER — CIPROFLOXACIN HCL 500 MG PO TABS: 500.0000 mg | ORAL_TABLET | Freq: Two times a day (BID) | ORAL | Status: DC

## 2015-05-14 NOTE — Progress Notes (Signed)
Patient ID: [name], female   DOB: [birth year], 74 y.o.   MRN: [id number]   Subjective:    Patient ID: [name], female    DOB: [birth year], 74 y.o.   MRN: [id number]  Patient presents for Dysuria   Pt here with dysuria for past week, has taken AZO otc with minimal improvement. No fever, no abd pain, no change in bowels. Feels well otherwise. Denies vaginal bleeding.     Review Of Systems:  GEN- denies fatigue, fever, weight loss,weakness, recent illness HEENT- denies eye drainage, change in vision, nasal discharge, CVS- denies chest pain, palpitations RESP- denies SOB, cough, wheeze ABD- denies N/V, change in stools, abd pain GU-+dysuria, hematuria, dribbling, incontinence MSK- denies joint pain, muscle aches, injury Neuro- denies headache, dizziness, syncope, seizure activity       Objective:    BP 140/86 mmHg  Pulse 102  Temp(Src) 97.5 F (36.4 C) (Oral)  Resp 18  Ht 5\' 3"  (1.6 m)  Wt 115 lb (52.164 kg)  BMI 20.38 kg/m2 GEN- NAD, alert and oriented x3 CVS- RRR, no murmur RESP-CTAB ABD-NABS,soft,NT,ND, no CVA tenderness, no suprapubic tenderness EXT- No edema Pulses- Radial,  2+        Assessment & Plan:      Problem List Items Addressed This Visit    None    Visit Diagnoses    Acute cystitis without hematuria    -  Primary    Treat with Cipro x 5 days, continue AZO    Relevant Orders    Urinalysis, Routine w reflex microscopic (not at [facility]) (Completed)       Note: This dictation was prepared with Dragon dictation along with smaller phrase technology. Any transcriptional errors that result from this process are unintentional.

## 2015-05-14 NOTE — Patient Instructions (Signed)
Take antibiotics as prescribed  Continue AZO F/U as needed

## 2015-07-23 ENCOUNTER — Other Ambulatory Visit: Payer: Self-pay | Admitting: Family Medicine

## 2015-07-23 DIAGNOSIS — Z1231 Encounter for screening mammogram for malignant neoplasm of breast: Secondary | ICD-10-CM

## 2015-08-02 ENCOUNTER — Ambulatory Visit (INDEPENDENT_AMBULATORY_CARE_PROVIDER_SITE_OTHER): Payer: Medicare Other | Admitting: Family Medicine

## 2015-08-02 ENCOUNTER — Encounter: Payer: Self-pay | Admitting: Family Medicine

## 2015-08-02 VITALS — BP 138/74 | HR 98 | Temp 98.1°F | Resp 18 | Ht 63.0 in | Wt 116.0 lb

## 2015-08-02 DIAGNOSIS — G4700 Insomnia, unspecified: Secondary | ICD-10-CM

## 2015-08-02 MED ORDER — CLONAZEPAM 0.5 MG PO TABS: 0.5000 mg | ORAL_TABLET | Freq: Every day | ORAL | Status: DC

## 2015-08-02 NOTE — Progress Notes (Signed)
Subjective:    Patient ID: [name], female    DOB: [birth year], 74 y.o.   MRN: [id number]  HPI  patient has a histry of insomnia. She is dependent upon taking Ambien every night to help her sleep. Unfortunately her insurance will not pay for 30 tablets a month. The patient is afraid of driving and will not drive to [facility] where she can bite cheaper. Recently I tried starting the patient on Xanax to be taken at night to help her sleep. However the patient stop this after a month she associated it with urinary retention. She is asking for a different option to help with her insomnia. Patient has only had one exacerbation of COPD since January 2015. However she has independently decided to stop Spiriva because she doesn't think she needs it and because of the cost. I cautioned the patient against doing this but she would like to try to come off of it to save herself some money Past Medical History  Diagnosis Date  . Osteoporosis   . Hyperlipidemia   . COPD (chronic obstructive pulmonary disease)   . Chronic UTI   . BV (bacterial vaginosis)   . FH: cataracts    Past Surgical History  Procedure Laterality Date  . Fracture surgery      rt left knee cap  . Colonoscopy  04/19/2012    Procedure: COLONOSCOPY;  Surgeon: Danie Binder, MD;  [facility];  Service: Endoscopy;  Laterality: N/A;  11:30 AM   Current Outpatient Prescriptions on File Prior to Visit  Medication Sig Dispense Refill  . Fluticasone-Salmeterol (ADVAIR) 250-50 MCG/DOSE AEPB Inhale 1 puff into the lungs 2 (two) times daily. 60 each 11  . ipratropium (ATROVENT HFA) 17 MCG/ACT inhaler Inhale 2 puffs into the lungs every 6 (six) hours. 1 Inhaler 11  . ipratropium (ATROVENT) 0.02 % nebulizer solution Take 2.5 mLs (500 mcg total) by nebulization 2 (two) times daily as needed. For shortness of breath 75 mL 5  . zolpidem (AMBIEN) 10 MG tablet Take 1 tablet (10 mg total) by mouth at bedtime. 30 tablet 2   No current  facility-administered medications on file prior to visit.   Allergies  Allergen Reactions  . Naproxen Sodium Nausea And Vomiting  . Symbicort [Budesonide-Formoterol Fumarate] Shortness Of Breath    Breathing difficulty  . Alendronate Sodium Other (See Comments)    Kidney failure  . Boniva [Ibandronate Sodium] Other (See Comments)    Kidney failure   . Risedronate Sodium   . Zetia [Ezetimibe] Other (See Comments)    Muscle pain   Social History   Social History  . Marital Status: Married    Spouse Name: N/A  . Number of Children: N/A  . Years of Education: N/A   Occupational History  . Not on file.   Social History Main Topics  . Smoking status: Former Research scientist (life sciences)  . Smokeless tobacco: Never Used  . Alcohol Use: No  . Drug Use: No  . Sexual Activity: Not on file     Comment: married, husband smokes.   Other Topics Concern  . Not on file   Social History Narrative      Review of Systems  All other systems reviewed and are negative.      Objective:   Physical Exam  Cardiovascular: Normal rate, regular rhythm and normal heart sounds.   No murmur heard. Pulmonary/Chest: No accessory muscle usage. No respiratory distress. She has decreased breath sounds in the right upper  field, the right lower field, the left upper field and the left lower field. She has no wheezes. She has no rhonchi. She has no rales.  Vitals reviewed.         Assessment & Plan:  Insomnia - Plan: clonazePAM (KLONOPIN) 0.5 MG tablet   We will try the patient on Klonopin 0.5 mg by mouth daily at bedtime as needed for insomnia. If this is too strong she can take half a tablet at night. I recommended that she continues per evening in addition to Advair as this seems to have kept her breathing relatively stable over the last year. However the patient is going to try to wean off of it to save herself some money.

## 2015-08-27 ENCOUNTER — Ambulatory Visit (HOSPITAL_COMMUNITY)
Admission: RE | Admit: 2015-08-27 | Discharge: 2015-08-27 | Disposition: A | Discharge status: Home / Self Care | Payer: Medicare Other | Source: Ambulatory Visit | Attending: Family Medicine | Admitting: Family Medicine

## 2015-08-27 DIAGNOSIS — Z1231 Encounter for screening mammogram for malignant neoplasm of breast: Secondary | ICD-10-CM | POA: Diagnosis not present

## 2015-09-25 ENCOUNTER — Encounter: Payer: Medicare Other | Admitting: Family Medicine

## 2015-10-09 ENCOUNTER — Encounter: Payer: Self-pay | Admitting: Family Medicine

## 2015-10-09 ENCOUNTER — Other Ambulatory Visit: Payer: Medicare Other

## 2015-10-09 ENCOUNTER — Ambulatory Visit (INDEPENDENT_AMBULATORY_CARE_PROVIDER_SITE_OTHER): Payer: Medicare Other | Admitting: Family Medicine

## 2015-10-09 VITALS — BP 132/64 | HR 82 | Temp 98.0°F | Resp 22 | Ht 63.0 in | Wt 118.0 lb

## 2015-10-09 DIAGNOSIS — Z79899 Other long term (current) drug therapy: Secondary | ICD-10-CM | POA: Diagnosis not present

## 2015-10-09 DIAGNOSIS — E785 Hyperlipidemia, unspecified: Secondary | ICD-10-CM | POA: Diagnosis not present

## 2015-10-09 DIAGNOSIS — Z23 Encounter for immunization: Secondary | ICD-10-CM

## 2015-10-09 DIAGNOSIS — Z0000 Encounter for general adult medical examination without abnormal findings: Secondary | ICD-10-CM | POA: Diagnosis not present

## 2015-10-09 DIAGNOSIS — M810 Age-related osteoporosis without current pathological fracture: Secondary | ICD-10-CM | POA: Diagnosis not present

## 2015-10-09 DIAGNOSIS — J449 Chronic obstructive pulmonary disease, unspecified: Secondary | ICD-10-CM

## 2015-10-09 DIAGNOSIS — J441 Chronic obstructive pulmonary disease with (acute) exacerbation: Secondary | ICD-10-CM

## 2015-10-09 LAB — CBC WITH DIFFERENTIAL/PLATELET
BASOS ABS: 0.1 10*3/uL (ref 0.0–0.1)
Basophils Relative: 1 % (ref 0–1)
EOS ABS: 0.2 10*3/uL (ref 0.0–0.7)
EOS PCT: 2 % (ref 0–5)
HCT: 45.7 % (ref 36.0–46.0)
Hemoglobin: 15.5 g/dL — ABNORMAL HIGH (ref 12.0–15.0)
LYMPHS ABS: 2.1 10*3/uL (ref 0.7–4.0)
Lymphocytes Relative: 21 % (ref 12–46)
MCH: 31.1 pg (ref 26.0–34.0)
MCHC: 33.9 g/dL (ref 30.0–36.0)
MCV: 91.8 fL (ref 78.0–100.0)
MONOS PCT: 8 % (ref 3–12)
MPV: 9.6 fL (ref 8.6–12.4)
Monocytes Absolute: 0.8 10*3/uL (ref 0.1–1.0)
Neutro Abs: 6.9 10*3/uL (ref 1.7–7.7)
Neutrophils Relative %: 68 % (ref 43–77)
PLATELETS: 405 10*3/uL — AB (ref 150–400)
RBC: 4.98 MIL/uL (ref 3.87–5.11)
RDW: 15.3 % (ref 11.5–15.5)
WBC: 10.2 10*3/uL (ref 4.0–10.5)

## 2015-10-09 LAB — TSH: TSH: 0.711 u[IU]/mL (ref 0.350–4.500)

## 2015-10-09 LAB — COMPLETE METABOLIC PANEL WITH GFR
ALT: 24 U/L (ref 6–29)
AST: 37 U/L — ABNORMAL HIGH (ref 10–35)
Albumin: 4.3 g/dL (ref 3.6–5.1)
Alkaline Phosphatase: 145 U/L — ABNORMAL HIGH (ref 33–130)
BUN: 15 mg/dL (ref 7–25)
CO2: 28 mmol/L (ref 20–31)
Calcium: 9.4 mg/dL (ref 8.6–10.4)
Chloride: 100 mmol/L (ref 98–110)
Creat: 0.57 mg/dL — ABNORMAL LOW (ref 0.60–0.93)
GFR, Est African American: >89 mL/min (ref >=60)
GLUCOSE: 87 mg/dL (ref 70–99)
POTASSIUM: 4.1 mmol/L (ref 3.5–5.3)
SODIUM: 135 mmol/L (ref 135–146)
Total Bilirubin: 0.6 mg/dL (ref 0.2–1.2)
Total Protein: 7.5 g/dL (ref 6.1–8.1)

## 2015-10-09 LAB — LIPID PANEL
Cholesterol: 238 mg/dL — ABNORMAL HIGH (ref 125–200)
HDL: 68 mg/dL (ref >=46)
LDL CALC: 156 mg/dL — AB (ref <130)
TRIGLYCERIDES: 69 mg/dL (ref <150)
Total CHOL/HDL Ratio: 3.5 Ratio (ref <=5.0)
VLDL: 14 mg/dL (ref <30)

## 2015-10-09 MED ORDER — DOXYCYCLINE HYCLATE 100 MG PO TABS: 100.0000 mg | ORAL_TABLET | Freq: Two times a day (BID) | ORAL | Status: DC

## 2015-10-09 MED ORDER — PREDNISONE 20 MG PO TABS: 40.0000 mg | ORAL_TABLET | Freq: Every day | ORAL | Status: DC

## 2015-10-09 MED ORDER — ALBUTEROL SULFATE HFA 108 (90 BASE) MCG/ACT IN AERS: 2.0000 | INHALATION_SPRAY | RESPIRATORY_TRACT | Status: DC | PRN

## 2015-10-09 NOTE — Progress Notes (Signed)
Subjective:    Patient ID: [name], female    DOB: 1941/10/03, 74 y.o.   MRN: [id number]  HPI Patient has underlying history of COPD. At present she is using Advair on a daily basis and what sounds like albuterol once a day. She states that she is on an inhaler that starts with a P and may be pro-air.  She is no longer taking Atrovent.  Over the last 48 hours, the patient has noticed a worsening cough, increasing chest congestion, and more wheezing. She normally has a cough productive of clear sputum. The sputum has not changed in amount or in consistency or in color. However she is wheezing more and she does report more shortness of breath Past Medical History  Diagnosis Date  . Osteoporosis   . Hyperlipidemia   . COPD (chronic obstructive pulmonary disease) ([place])   . Chronic UTI   . BV (bacterial vaginosis)   . FH: cataracts    Past Surgical History  Procedure Laterality Date  . Fracture surgery      rt left knee cap  . Colonoscopy  04/19/2012    Procedure: COLONOSCOPY;  Surgeon: Danie Binder, MD;  [facility];  Service: Endoscopy;  Laterality: N/A;  11:30 AM   Current Outpatient Prescriptions on File Prior to Visit  Medication Sig Dispense Refill  . clonazePAM (KLONOPIN) 0.5 MG tablet Take 1 tablet (0.5 mg total) by mouth at bedtime. 30 tablet 1  . Fluticasone-Salmeterol (ADVAIR) 250-50 MCG/DOSE AEPB Inhale 1 puff into the lungs 2 (two) times daily. 60 each 11  . zolpidem (AMBIEN) 10 MG tablet Take 1 tablet (10 mg total) by mouth at bedtime. 30 tablet 2  . ipratropium (ATROVENT HFA) 17 MCG/ACT inhaler Inhale 2 puffs into the lungs every 6 (six) hours. (Patient not taking: Reported on 10/09/2015) 1 Inhaler 11  . ipratropium (ATROVENT) 0.02 % nebulizer solution Take 2.5 mLs (500 mcg total) by nebulization 2 (two) times daily as needed. For shortness of breath (Patient not taking: Reported on 10/09/2015) 75 mL 5   No current facility-administered medications on file  prior to visit.   Allergies  Allergen Reactions  . Naproxen Sodium Nausea And Vomiting  . Symbicort [Budesonide-Formoterol Fumarate] Shortness Of Breath    Breathing difficulty  . Alendronate Sodium Other (See Comments)    Kidney failure  . Boniva [Ibandronate Sodium] Other (See Comments)    Kidney failure   . Risedronate Sodium   . Zetia [Ezetimibe] Other (See Comments)    Muscle pain   Social History   Social History  . Marital Status: Married    Spouse Name: N/A  . Number of Children: N/A  . Years of Education: N/A   Occupational History  . Not on file.   Social History Main Topics  . Smoking status: Former Research scientist (life sciences)  . Smokeless tobacco: Never Used  . Alcohol Use: No  . Drug Use: No  . Sexual Activity: Not on file     Comment: married, husband smokes.   Other Topics Concern  . Not on file   Social History Narrative      Review of Systems  All other systems reviewed and are negative.      Objective:   Physical Exam  Constitutional: She appears well-developed and well-nourished.  HENT:  Right Ear: External ear normal.  Left Ear: External ear normal.  Nose: Nose normal.  Mouth/Throat: Oropharynx is clear and moist. No oropharyngeal exudate.  Eyes: Conjunctivae are normal.  Neck: Neck supple.  Cardiovascular: Normal rate, regular rhythm and normal heart sounds.   Pulmonary/Chest: No respiratory distress. She has decreased breath sounds. She has wheezes. She has no rales. She exhibits no tenderness.  Lymphadenopathy:    She has no cervical adenopathy.  Vitals reviewed.         Assessment & Plan:  COPD exacerbation ([place]) - Plan: albuterol (PROVENTIL HFA;VENTOLIN HFA) 108 (90 BASE) MCG/ACT inhaler, predniSONE (DELTASONE) 20 MG tablet, doxycycline (VIBRA-TABS) 100 MG tablet  I believe the patient has an upper respiratory infection that is triggering a COPD exacerbation. However at the present time, without mucopurulent sputum, I see no indication for  antibiotics. Instead I will have the patient take prednisone 40 mg a day for 7 days and albuterol 2 puffs inhaled every 4-6 hours as needed. If the color, characteristics, or amount of sputum increase, I want her to get on doxycycline 100 mg by mouth twice a day for 7 days. Patient understands and will comply

## 2015-10-10 LAB — VITAMIN D 25 HYDROXY (VIT D DEFICIENCY, FRACTURES): Vit D, 25-Hydroxy: 26 ng/mL — ABNORMAL LOW (ref 30–100)

## 2015-10-11 ENCOUNTER — Other Ambulatory Visit: Payer: Self-pay | Admitting: Family Medicine

## 2015-10-11 MED ORDER — VITAMIN D (ERGOCALCIFEROL) 1.25 MG (50000 UNIT) PO CAPS: ORAL_CAPSULE | ORAL | Status: DC

## 2015-10-15 ENCOUNTER — Ambulatory Visit (INDEPENDENT_AMBULATORY_CARE_PROVIDER_SITE_OTHER): Payer: Medicare Other | Admitting: Family Medicine

## 2015-10-15 ENCOUNTER — Encounter: Payer: Self-pay | Admitting: Family Medicine

## 2015-10-15 VITALS — BP 142/78 | HR 98 | Temp 98.5°F | Resp 18 | Ht 63.0 in | Wt 118.0 lb

## 2015-10-15 DIAGNOSIS — Z0000 Encounter for general adult medical examination without abnormal findings: Secondary | ICD-10-CM | POA: Diagnosis not present

## 2015-10-15 NOTE — Progress Notes (Signed)
Subjective:    Patient ID: [name], female    DOB: 09-08-1941, 74 y.o.   MRN: [id number]  HPI Patient is here today for complete physical exam. Please see my last office visit. She continues to wheeze and cough. She started doxycycline but she never took the prednisone as I recommended. She states that her symptoms are not much better today. She denies any hemoptysis. She denies any purulent sputum. She denies any chest pain. She denies any fever. Mammogram was performed in September and was normal. Her last colonoscopy was in 2013 and is not due again until 2023. Due to a she does not require a Pap smear. Her bone density is significant for osteoporosis however she is felt 3 different bisphosphonates as well as prolia. Lab on 10/09/2015  Component Date Value Ref Range Status  . Sodium 10/09/2015 135  135 - 146 mmol/L Final  . Potassium 10/09/2015 4.1  3.5 - 5.3 mmol/L Final  . Chloride 10/09/2015 100  98 - 110 mmol/L Final  . CO2 10/09/2015 28  20 - 31 mmol/L Final  . Glucose, Bld 10/09/2015 87  70 - 99 mg/dL Final  . BUN 10/09/2015 15  7 - 25 mg/dL Final  . Creat 10/09/2015 0.57* 0.60 - 0.93 mg/dL Final  . Total Bilirubin 10/09/2015 0.6  0.2 - 1.2 mg/dL Final  . Alkaline Phosphatase 10/09/2015 145* 33 - 130 U/L Final  . AST 10/09/2015 37* 10 - 35 U/L Final  . ALT 10/09/2015 24  6 - 29 U/L Final  . Total Protein 10/09/2015 7.5  6.1 - 8.1 g/dL Final  . Albumin 10/09/2015 4.3  3.6 - 5.1 g/dL Final  . Calcium 10/09/2015 9.4  8.6 - 10.4 mg/dL Final  . GFR, Est African American 10/09/2015 >89  >=60 mL/min Final  . GFR, Est Non African American 10/09/2015 >89  >=60 mL/min Final   Comment:   The estimated GFR is a calculation valid for adults (>=49 years old) that uses the CKD-EPI algorithm to adjust for age and sex. It is   not to be used for children, pregnant women, hospitalized patients,    patients on dialysis, or with rapidly changing kidney function. According to the NKDEP,  eGFR >89 is normal, 60-89 shows mild impairment, 30-59 shows moderate impairment, 15-29 shows severe impairment and <15 is ESRD.     Marland Kitchen TSH 10/09/2015 0.711  0.350 - 4.500 uIU/mL Final  . Cholesterol 10/09/2015 238* 125 - 200 mg/dL Final  . Triglycerides 10/09/2015 69  <150 mg/dL Final  . HDL 10/09/2015 68  >=46 mg/dL Final  . Total CHOL/HDL Ratio 10/09/2015 3.5  <=5.0 Ratio Final  . VLDL 10/09/2015 14  <30 mg/dL Final  . LDL Cholesterol 10/09/2015 156* <130 mg/dL Final   Comment:   Total Cholesterol/HDL Ratio:CHD Risk                        Coronary Heart Disease Risk Table                                        Men       Women          1/2 Average Risk              3.4        3.3  Average Risk              5.0        4.4           2X Average Risk              9.6        7.1           3X Average Risk             23.4       11.0 Use the calculated Patient Ratio above and the CHD Risk table  to determine the patient's CHD Risk.   . WBC 10/09/2015 10.2  4.0 - 10.5 K/uL Final  . RBC 10/09/2015 4.98  3.87 - 5.11 MIL/uL Final  . Hemoglobin 10/09/2015 15.5* 12.0 - 15.0 g/dL Final  . HCT 10/09/2015 45.7  36.0 - 46.0 % Final  . MCV 10/09/2015 91.8  78.0 - 100.0 fL Final  . MCH 10/09/2015 31.1  26.0 - 34.0 pg Final  . MCHC 10/09/2015 33.9  30.0 - 36.0 g/dL Final  . RDW 10/09/2015 15.3  11.5 - 15.5 % Final  . Platelets 10/09/2015 405* 150 - 400 K/uL Final  . MPV 10/09/2015 9.6  8.6 - 12.4 fL Final  . Neutrophils Relative % 10/09/2015 68  43 - 77 % Final  . Neutro Abs 10/09/2015 6.9  1.7 - 7.7 K/uL Final  . Lymphocytes Relative 10/09/2015 21  12 - 46 % Final  . Lymphs Abs 10/09/2015 2.1  0.7 - 4.0 K/uL Final  . Monocytes Relative 10/09/2015 8  3 - 12 % Final  . Monocytes Absolute 10/09/2015 0.8  0.1 - 1.0 K/uL Final  . Eosinophils Relative 10/09/2015 2  0 - 5 % Final  . Eosinophils Absolute 10/09/2015 0.2  0.0 - 0.7 K/uL Final  . Basophils Relative 10/09/2015 1  0 - 1 % Final   . Basophils Absolute 10/09/2015 0.1  0.0 - 0.1 K/uL Final  . Smear Review 10/09/2015 Criteria for review not met   Final  . Vit D, 25-Hydroxy 10/09/2015 26* 30 - 100 ng/mL Final   Comment: Vitamin D Status           25-OH Vitamin D        Deficiency                <20 ng/mL        Insufficiency         20 - 29 ng/mL        Optimal             > or = 30 ng/mL   For 25-OH Vitamin D testing on patients on D2-supplementation and patients for whom quantitation of D2 and D3 fractions is required, the QuestAssureD 25-OH VIT D, (D2,D3), LC/MS/MS is recommended: order code [id number] (patients > 2 yrs).    Past Medical History  Diagnosis Date  . Osteoporosis   . Hyperlipidemia   . COPD (chronic obstructive pulmonary disease) ([place])   . Chronic UTI   . BV (bacterial vaginosis)   . FH: cataracts    Past Surgical History  Procedure Laterality Date  . Fracture surgery      rt left knee cap  . Colonoscopy  04/19/2012    Procedure: COLONOSCOPY;  Surgeon: Danie Binder, MD;  Location: [facility];  Service: Endoscopy;  Laterality: N/A;  11:30 AM   Current Outpatient Prescriptions on File Prior to Visit  Medication  Sig Dispense Refill  . clonazePAM (KLONOPIN) 0.5 MG tablet Take 1 tablet (0.5 mg total) by mouth at bedtime. 30 tablet 1  . doxycycline (VIBRA-TABS) 100 MG tablet Take 1 tablet (100 mg total) by mouth 2 (two) times daily. 20 tablet 0  . Fluticasone-Salmeterol (ADVAIR) 250-50 MCG/DOSE AEPB Inhale 1 puff into the lungs 2 (two) times daily. 60 each 11  . Vitamin D, Ergocalciferol, (DRISDOL) 50000 UNITS CAPS capsule Take 1 tab q week for 6 months 30 capsule 0   No current facility-administered medications on file prior to visit.   Allergies  Allergen Reactions  . Naproxen Sodium Nausea And Vomiting  . Symbicort [Budesonide-Formoterol Fumarate] Shortness Of Breath    Breathing difficulty  . Alendronate Sodium Other (See Comments)    Kidney failure  . Boniva [Ibandronate Sodium] Other  (See Comments)    Kidney failure   . Risedronate Sodium   . Zetia [Ezetimibe] Other (See Comments)    Muscle pain   Social History   Social History  . Marital Status: Married    Spouse Name: N/A  . Number of Children: N/A  . Years of Education: N/A   Occupational History  . Not on file.   Social History Main Topics  . Smoking status: Former Research scientist (life sciences)  . Smokeless tobacco: Never Used  . Alcohol Use: No  . Drug Use: No  . Sexual Activity: Not on file     Comment: married, husband smokes.   Other Topics Concern  . Not on file   Social History Narrative   Family History  Problem Relation Age of Onset  . Cancer Father     pancreatic      Review of Systems  All other systems reviewed and are negative.      Objective:   Physical Exam  Constitutional: She is oriented to person, place, and time. She appears well-developed and well-nourished. No distress.  HENT:  Head: Normocephalic and atraumatic.  Right Ear: External ear normal.  Left Ear: External ear normal.  Nose: Nose normal.  Mouth/Throat: Oropharynx is clear and moist. No oropharyngeal exudate.  Eyes: Conjunctivae and EOM are normal. Pupils are equal, round, and reactive to light. Right eye exhibits no discharge. Left eye exhibits no discharge. No scleral icterus.  Neck: Normal range of motion. Neck supple. No JVD present. No tracheal deviation present. No thyromegaly present.  Cardiovascular: Normal rate, regular rhythm, normal heart sounds and intact distal pulses.  Exam reveals no gallop and no friction rub.   No murmur heard. Pulmonary/Chest: No accessory muscle usage or stridor. No respiratory distress. She has decreased breath sounds. She has wheezes in the right upper field, the right lower field, the left upper field and the left lower field. She has no rhonchi. She has no rales.  Abdominal: Soft. Bowel sounds are normal. She exhibits no distension and no mass. There is no tenderness. There is no rebound  and no guarding.  Musculoskeletal: Normal range of motion. She exhibits no edema or tenderness.  Lymphadenopathy:    She has no cervical adenopathy.  Neurological: She is alert and oriented to person, place, and time. She has normal reflexes. She displays normal reflexes. No cranial nerve deficit. She exhibits normal muscle tone. Coordination normal.  Skin: Skin is warm. No rash noted. She is not diaphoretic. No erythema. No pallor.  Psychiatric: She has a normal mood and affect. Her behavior is normal. Judgment and thought content normal.  Vitals reviewed.  Assessment & Plan:  Routine general medical examination at a health care facility  I asked the patient to start prednisone as I recommended at her last office visit. I believe this will help her breathing is much as anything. If symptoms are not improving after the prednisone, I will start the patient on Levaquin. Cancer screening is up-to-date. I recommended Reclast for osteoporosis. The patient declines and will think about it and she will call me if she changes her mind. I recommended a statin for cholesterol but she refused. She will be willing to try fish oil 2000 mg by mouth daily. She is also willing to take 50,000 units of vitamin D weekly for the next 6 months due to her low vitamin D level. The remainder of her lab work is acceptable. I recommended spiriva as a maintenance medicine for her COPD but she declined due to cost

## 2015-10-18 ENCOUNTER — Telehealth: Payer: Self-pay | Admitting: Family Medicine

## 2015-10-18 NOTE — Telephone Encounter (Signed)
She needs to start the prednisone as I explained at the last 2 office visits.  I am okay with levaquin 500 poqday for 7 days only if she agrees to take it with prednisone.

## 2015-10-18 NOTE — Telephone Encounter (Signed)
Called and spoke to pt and she is still very congested and coughing. She did complete the doxycycline but has not had any prednisone. (see LOV). MD please advise.

## 2015-10-18 NOTE — Telephone Encounter (Signed)
Tried to call pt no answer and no vm.

## 2015-10-18 NOTE — Telephone Encounter (Signed)
Patient is still having congestion and would like to speak to you about this  [phone]

## 2015-10-19 ENCOUNTER — Telehealth: Payer: Self-pay | Admitting: Family Medicine

## 2015-10-19 NOTE — Telephone Encounter (Signed)
Ok.  I would still recommend levaquin and prednisone if worse.

## 2015-10-19 NOTE — Telephone Encounter (Signed)
Dr. Dennard Schaumann aware via telephone note

## 2015-10-19 NOTE — Telephone Encounter (Signed)
Pt called this morning to let Dr. Dennard Schaumann know that she is feeling better and no longer needs the medication requested.

## 2015-10-19 NOTE — Telephone Encounter (Signed)
Pt called this am and states that she is dong better and does not need the medication.

## 2015-10-22 ENCOUNTER — Other Ambulatory Visit: Payer: Self-pay | Admitting: Family Medicine

## 2015-10-23 NOTE — Telephone Encounter (Signed)
?   OK to Refill  - LRF - 08/02/15 - LOV - 10/15/15

## 2015-10-23 NOTE — Telephone Encounter (Signed)
okay

## 2015-11-07 ENCOUNTER — Ambulatory Visit (INDEPENDENT_AMBULATORY_CARE_PROVIDER_SITE_OTHER): Payer: Medicare Other | Admitting: Family Medicine

## 2015-11-07 ENCOUNTER — Encounter: Payer: Self-pay | Admitting: Family Medicine

## 2015-11-07 VITALS — BP 140/72 | HR 102 | Temp 98.0°F | Resp 20 | Ht 63.0 in | Wt 114.0 lb

## 2015-11-07 DIAGNOSIS — J441 Chronic obstructive pulmonary disease with (acute) exacerbation: Secondary | ICD-10-CM

## 2015-11-07 MED ORDER — LEVOFLOXACIN 500 MG PO TABS
500.0000 mg | ORAL_TABLET | Freq: Every day | ORAL | Status: DC
Start: 2015-11-07 — End: 2016-03-12

## 2015-11-07 MED ORDER — GUAIFENESIN-CODEINE 100-10 MG/5ML PO SOLN
5.0000 mL | Freq: Four times a day (QID) | ORAL | Status: DC | PRN
Start: 2015-11-07 — End: 2016-03-12

## 2015-11-07 MED ORDER — PREDNISONE 10 MG PO TABS: ORAL_TABLET | ORAL | Status: DC

## 2015-11-07 MED ORDER — ALBUTEROL SULFATE (2.5 MG/3ML) 0.083% IN NEBU: 2.5000 mg | INHALATION_SOLUTION | Freq: Four times a day (QID) | RESPIRATORY_TRACT | Status: DC | PRN

## 2015-11-07 MED ORDER — METHYLPREDNISOLONE ACETATE 40 MG/ML IJ SUSP
40.0000 mg | Freq: Once | INTRAMUSCULAR | Status: AC
  Administered 2015-11-07: 40 mg via INTRAMUSCULAR

## 2015-11-07 MED ORDER — IPRATROPIUM-ALBUTEROL 0.5-2.5 (3) MG/3ML IN SOLN
3.0000 mL | Freq: Once | RESPIRATORY_TRACT | Status: AC
  Administered 2015-11-07: 3 mL via RESPIRATORY_TRACT

## 2015-11-07 NOTE — Patient Instructions (Addendum)
[facility] in [facility] ( [place]) Take antibiotics Use nebulizer every 4 hours Cough medicine given Take prednisone pills starting tomorrow F/U for recheck on Friday if not better

## 2015-11-07 NOTE — Progress Notes (Signed)
Patient ID: [name], female   DOB: Jan 02, 1941, 74 y.o.   MRN: [id number]   Subjective:    Patient ID: [name], female    DOB: 05/09/1941, 74 y.o.   MRN: [id number]  Patient presents for Cough  patient here with persistent cough with production wheezing. She is history of COPD. She was treated by her primary care provider on November 1 at that time she was prescribed doxycycline and prednisone but she did not take the prednisone. She returned a week later for recheck and she was still coughing. She was told to take the prednisone which she did complete. She states her symptoms improved some but she still coughing with production though now it is more clear she is also wheezing. She has a nebulizer at home but she has not been using this. She has not had any fever.+sick contacts    Review Of Systems:  GEN- + fatigue, fever, weight loss,weakness, recent illness HEENT- denies eye drainage, change in vision, nasal discharge, CVS- denies chest pain, palpitations RESP- denies SOB, +cough, +wheeze ABD- denies N/V, change in stools, abd pain Neuro- denies headache, dizziness, syncope, seizure activity       Objective:    BP 140/72 mmHg  Pulse 102  Temp(Src) 98 F (36.7 C) (Oral)  Resp 20  Ht 5\' 3"  (1.6 m)  Wt 114 lb (51.71 kg)  BMI 20.20 kg/m2  SpO2 93% GEN- NAD, alert and oriented x3 HEENT- PERRL, EOMI, non injected sclera, pink conjunctiva, MMM, oropharynx clear Neck- Supple, no thyromegaly CVS- mild tachycardia no murmur RESP-increased WOB, bilat wheeze, mild rales right base, sat 93% ABD-NABS,soft,NT,ND EXT- No edema Pulses- Radial, DP- 2+        Assessment & Plan:      Problem List Items Addressed This Visit    COPD exacerbation (HCC) - Primary    Persistent bronchitis exacerbation for the past month. Concerned she might have an underlying pneumonia. We'll get obtain a chest x-ray today. We'll plan to put her on Levaquin. I've also given her a DuoNeb in the  office her sats are okay around 93-94%. We'll also give her Depo-Medrol 40 mg IM and she will have a longer prednisone taper. One half or start robitussin Codiene      Relevant Medications   albuterol (PROVENTIL HFA;VENTOLIN HFA) 108 (90 BASE) MCG/ACT inhaler   predniSONE (DELTASONE) 10 MG tablet   Other Relevant Orders   DG Chest 2 View      Note: This dictation was prepared with Dragon dictation along with smaller phrase technology. Any transcriptional errors that result from this process are unintentional.

## 2015-11-07 NOTE — Addendum Note (Signed)
Addended by: Sheral Flow on: 11/07/2015 10:40 AM   Modules accepted: Orders

## 2015-11-07 NOTE — Assessment & Plan Note (Addendum)
Persistent bronchitis exacerbation for the past month. Concerned she might have an underlying pneumonia. We'll get obtain a chest x-ray today. We'll plan to put her on Levaquin. I've also given her a DuoNeb in the office her sats are okay around 93-94%. Improved WOB and bronchospasm after neb treatment. We'll also give her Depo-Medrol 40 mg IM and she will have a longer prednisone taper. Advised to use nebuluzer instead of albuterol inhaler while sick  start robitussin Codiene

## 2015-11-08 ENCOUNTER — Ambulatory Visit (HOSPITAL_COMMUNITY)
Admission: RE | Admit: 2015-11-08 | Discharge: 2015-11-08 | Disposition: A | Discharge status: Home / Self Care | Payer: Medicare Other | Source: Ambulatory Visit | Attending: Family Medicine | Admitting: Family Medicine

## 2015-11-08 DIAGNOSIS — J849 Interstitial pulmonary disease, unspecified: Secondary | ICD-10-CM | POA: Diagnosis not present

## 2015-11-08 DIAGNOSIS — J984 Other disorders of lung: Secondary | ICD-10-CM | POA: Insufficient documentation

## 2015-11-08 DIAGNOSIS — R05 Cough: Secondary | ICD-10-CM | POA: Diagnosis not present

## 2015-11-08 DIAGNOSIS — J441 Chronic obstructive pulmonary disease with (acute) exacerbation: Secondary | ICD-10-CM | POA: Diagnosis not present

## 2015-11-08 DIAGNOSIS — R0602 Shortness of breath: Secondary | ICD-10-CM | POA: Diagnosis not present

## 2015-11-14 ENCOUNTER — Ambulatory Visit: Payer: Medicare Other | Admitting: Family Medicine

## 2015-12-27 ENCOUNTER — Other Ambulatory Visit: Payer: Self-pay | Admitting: Family Medicine

## 2015-12-27 NOTE — Telephone Encounter (Signed)
Medication called to pharmacy. 

## 2015-12-27 NOTE — Telephone Encounter (Signed)
ok 

## 2015-12-27 NOTE — Telephone Encounter (Signed)
Ok to refill??  Last office visit 11/07/2015.  Last refill 01/11/2015, #1 refill.

## 2015-12-31 ENCOUNTER — Other Ambulatory Visit: Payer: Self-pay | Admitting: Family Medicine

## 2015-12-31 NOTE — Telephone Encounter (Signed)
Refill appropriate and filled per protocol. 

## 2016-01-31 ENCOUNTER — Other Ambulatory Visit: Payer: Self-pay | Admitting: Family Medicine

## 2016-01-31 NOTE — Telephone Encounter (Signed)
Ok to refill??  Last office visit 11/07/2015.  Last refill 12/27/2015, #1 refill.

## 2016-01-31 NOTE — Telephone Encounter (Signed)
ok 

## 2016-02-01 NOTE — Telephone Encounter (Signed)
LRF 12/27/15 #30 + 1.  LOV 11/07/15  OK refill?

## 2016-02-01 NOTE — Telephone Encounter (Signed)
Medication called to pharmacy. 

## 2016-02-08 ENCOUNTER — Ambulatory Visit: Payer: Medicare Other | Admitting: Family Medicine

## 2016-02-21 ENCOUNTER — Other Ambulatory Visit: Payer: Self-pay | Admitting: Family Medicine

## 2016-02-21 MED ORDER — FLUTICASONE-SALMETEROL 250-50 MCG/DOSE IN AEPB: INHALATION_SPRAY | RESPIRATORY_TRACT | Status: DC

## 2016-03-12 ENCOUNTER — Ambulatory Visit (INDEPENDENT_AMBULATORY_CARE_PROVIDER_SITE_OTHER): Payer: Medicare Other | Admitting: Family Medicine

## 2016-03-12 ENCOUNTER — Encounter: Payer: Self-pay | Admitting: Family Medicine

## 2016-03-12 VITALS — BP 136/64 | HR 110 | Temp 98.5°F | Resp 22 | Ht 63.0 in | Wt 115.0 lb

## 2016-03-12 DIAGNOSIS — J441 Chronic obstructive pulmonary disease with (acute) exacerbation: Secondary | ICD-10-CM | POA: Diagnosis not present

## 2016-03-12 MED ORDER — PREDNISONE 20 MG PO TABS: 40.0000 mg | ORAL_TABLET | Freq: Every day | ORAL | Status: DC

## 2016-03-12 MED ORDER — METHYLPREDNISOLONE ACETATE 40 MG/ML IJ SUSP
40.0000 mg | Freq: Once | INTRAMUSCULAR | Status: AC
  Administered 2016-03-12: 40 mg via INTRAMUSCULAR

## 2016-03-12 MED ORDER — ALBUTEROL SULFATE (2.5 MG/3ML) 0.083% IN NEBU
2.5000 mg | INHALATION_SOLUTION | Freq: Once | RESPIRATORY_TRACT | Status: AC
  Administered 2016-03-12: 2.5 mg via RESPIRATORY_TRACT

## 2016-03-12 NOTE — Assessment & Plan Note (Signed)
Acute exacerbation. I think were catching her right at the beginning. Unfortunately we have problems with her following through with her medications as prescribed. I've given her Depo-Medrol in the office as well as an albuterol nebulizer. We'll have her use the nebulizer every 4-6 hours as needed. I've also given her prednisone. I do not see any indication for antibiotics today. We will have her follow-up on Friday for recheck

## 2016-03-12 NOTE — Progress Notes (Signed)
Patient ID: [name], female   DOB: 19-Dec-1940, 74 y.o.   MRN: [id number]    Subjective:    Patient ID: [name], female    DOB: 06-17-1941, 75 y.o.   MRN: [id number]  Patient presents for Illness Patient here with shortness of breath wheezing or chest congestion for the past 2 days. She is history of underlying COPD. She's been using her Advair. She's not been using her albuterol very much as she has not used her nebulizer states that she forgets to use this. She states she was doing fine until she was out in the weather. She has not had any fever. There is no color to her sputum. She's been taking some Robitussin for the cough.  She is well-known to this clinic for her exacerbations.  Review Of Systems:  GEN- denies fatigue, fever, weight loss,weakness, recent illness HEENT- denies eye drainage, change in vision, nasal discharge, CVS- denies chest pain, palpitations RESP- denies SOB,+ cough, +wheeze ABD- denies N/V, change in stools, abd pain GU- denies dysuria, hematuria, dribbling, incontinence MSK- denies joint pain, muscle aches, injury Neuro- denies headache, dizziness, syncope, seizure activity       Objective:    BP 136/64 mmHg  Pulse 110  Temp(Src) 98.5 F (36.9 C) (Oral)  Resp 22  Ht 5\' 3"  (1.6 m)  Wt 115 lb (52.164 kg)  BMI 20.38 kg/m2  SpO2 94% GEN- NAD, alert and oriented x3 HEENT- PERRL, EOMI, non injected sclera, pink conjunctiva, MMM, oropharynx clear Neck- Supple, no thyromegaly CVS mild Tachycardia HR 100 , no murmur RESP-scattered wheeze, increased WOB ,+ suprasternal retractions, no cyanosis, no rales no rhonchi  EXT- No edema Pulses- Radial  2+  S/P NEB- improved WOB, minimal retractions, Depo Medrol also given  Sats maintained, okay to treat as outpatient      Assessment & Plan:      Problem List Items Addressed This Visit    COPD exacerbation ([place]) - Primary    Acute exacerbation. I think were catching her right at the  beginning. Unfortunately we have problems with her following through with her medications as prescribed. I've given her Depo-Medrol in the office as well as an albuterol nebulizer. We'll have her use the nebulizer every 4-6 hours as needed. I've also given her prednisone. I do not see any indication for antibiotics today. We will have her follow-up on Friday for recheck      Relevant Medications   predniSONE (DELTASONE) 20 MG tablet      Note: This dictation was prepared with Dragon dictation along with smaller phrase technology. Any transcriptional errors that result from this process are unintentional.

## 2016-03-12 NOTE — Patient Instructions (Signed)
Continue robitussin Use your nebulzier every 4 hours as needed Take prednisone as directed- STARTING TOMORROW F/u Friday For recheck on breathing

## 2016-03-12 NOTE — Addendum Note (Signed)
Addended by: Sheral Flow on: 03/12/2016 09:32 AM   Modules accepted: Orders

## 2016-03-14 ENCOUNTER — Encounter: Payer: Self-pay | Admitting: Family Medicine

## 2016-03-14 ENCOUNTER — Ambulatory Visit (INDEPENDENT_AMBULATORY_CARE_PROVIDER_SITE_OTHER): Payer: Medicare Other | Admitting: Family Medicine

## 2016-03-14 VITALS — BP 120/72 | HR 84 | Temp 98.0°F | Resp 20 | Wt 116.0 lb

## 2016-03-14 DIAGNOSIS — J441 Chronic obstructive pulmonary disease with (acute) exacerbation: Secondary | ICD-10-CM | POA: Diagnosis not present

## 2016-03-14 NOTE — Progress Notes (Signed)
Patient ID: [name], female   DOB: [birth year], 75 y.o.   MRN: [id number]    Subjective:    Patient ID: [name], female    DOB: [birth year], 75 y.o.   MRN: [id number]  Patient presents for COPD followup  Patient for COPD follow-up. She was here on Wednesday for exacerbation she was given Depo-Medrol injection nebulizer treatment in the office. She was sent home with prednisone burst and to use her nebulizer every 4-6 hours as needed. Today states her breathing is much better. She is little hoarse she states she gets this way after she uses the nebulizer tries to rinse her mouth out. She does not have any new complaints today. No chest pain her cough has mild production and it is still clear.   Review Of Systems:  GEN- denies fatigue, fever, weight loss,weakness, recent illness HEENT- denies eye drainage, change in vision, nasal discharge, CVS- denies chest pain, palpitations RESP- denies SOB, +cough, +wheeze Neuro- denies headache, dizziness, syncope, seizure activity       Objective:    BP 120/72 mmHg  Pulse 84  Temp(Src) 98 F (36.7 C) (Oral)  Resp 20  Wt 116 lb (52.617 kg)  SpO2 94% GEN- NAD, alert and oriented x3 HEENT- PERRL, EOMI, non injected sclera, pink conjunctiva, MMM, oropharynx clear CVS mild Tachycardia HR 100 , no murmur RESP-scattered wheeze,normal WOB, no retractions  EXT- No edema Pulses- Radial  2+      Assessment & Plan:      Problem List Items Addressed This Visit    COPD exacerbation ([place]) - Primary    She is much improved from her recent exacerbation. She will complete her current course of prednisone and continue with her inhalers. She'll follow-up as needed.         Note: This dictation was prepared with Dragon dictation along with smaller phrase technology. Any transcriptional errors that result from this process are unintentional.

## 2016-03-14 NOTE — Assessment & Plan Note (Signed)
She is much improved from her recent exacerbation. She will complete her current course of prednisone and continue with her inhalers. She'll follow-up as needed.

## 2016-03-14 NOTE — Patient Instructions (Signed)
Complete prednisone Keep using nebulizer  F/U as needed

## 2016-04-03 ENCOUNTER — Other Ambulatory Visit: Payer: Self-pay | Admitting: Family Medicine

## 2016-05-01 ENCOUNTER — Other Ambulatory Visit: Payer: Self-pay | Admitting: Family Medicine

## 2016-05-01 NOTE — Telephone Encounter (Signed)
Refill appropriate and filled per protocol. 

## 2016-06-09 ENCOUNTER — Ambulatory Visit (INDEPENDENT_AMBULATORY_CARE_PROVIDER_SITE_OTHER): Payer: Medicare Other | Admitting: Family Medicine

## 2016-06-09 ENCOUNTER — Encounter: Payer: Self-pay | Admitting: Family Medicine

## 2016-06-09 VITALS — BP 132/70 | HR 76 | Temp 98.6°F | Resp 16 | Ht 63.0 in | Wt 116.0 lb

## 2016-06-09 DIAGNOSIS — J418 Mixed simple and mucopurulent chronic bronchitis: Secondary | ICD-10-CM

## 2016-06-09 DIAGNOSIS — G4700 Insomnia, unspecified: Secondary | ICD-10-CM | POA: Diagnosis not present

## 2016-06-09 DIAGNOSIS — R300 Dysuria: Secondary | ICD-10-CM | POA: Diagnosis not present

## 2016-06-09 DIAGNOSIS — N390 Urinary tract infection, site not specified: Secondary | ICD-10-CM

## 2016-06-09 DIAGNOSIS — F5104 Psychophysiologic insomnia: Secondary | ICD-10-CM | POA: Insufficient documentation

## 2016-06-09 LAB — URINALYSIS, MICROSCOPIC ONLY
CASTS: NONE SEEN [LPF]
CRYSTALS: NONE SEEN [HPF]
Yeast: NONE SEEN [HPF]

## 2016-06-09 LAB — URINALYSIS, ROUTINE W REFLEX MICROSCOPIC
Bilirubin Urine: NEGATIVE
GLUCOSE, UA: NEGATIVE
Ketones, ur: NEGATIVE
Nitrite: NEGATIVE
PH: 5.5 (ref 5.0–8.0)
Protein, ur: NEGATIVE
Specific Gravity, Urine: 1.02 (ref 1.001–1.035)

## 2016-06-09 MED ORDER — IPRATROPIUM BROMIDE HFA 17 MCG/ACT IN AERS: 2.0000 | INHALATION_SPRAY | Freq: Four times a day (QID) | RESPIRATORY_TRACT | Status: DC | PRN

## 2016-06-09 MED ORDER — CIPROFLOXACIN HCL 500 MG PO TABS: 500.0000 mg | ORAL_TABLET | Freq: Two times a day (BID) | ORAL | Status: DC

## 2016-06-09 NOTE — Assessment & Plan Note (Signed)
Change to atroven, continue advair Overall doing well with regards to breathing, no recent exacerbations

## 2016-06-09 NOTE — Patient Instructions (Signed)
Try the taking 2 of the 0.5mg  klonopin for sleep at bedtime  Changed to atrovent for breathing  Take antibiotics as prescribed F/U as needed

## 2016-06-09 NOTE — Progress Notes (Signed)
Patient ID: [name], female   DOB: 1941-11-08, 75 y.o.   MRN: [id number]   Subjective:    Patient ID: [name], female    DOB: February 17, 1941, 75 y.o.   MRN: [id number]  Patient presents for Dysuria Patient is here for the past 2 weeks. She denies any fever abdominal pain nausea vomiting. She tried taking some over-the-counter cranberry extract with mild improvement. Has history of UTI  Medication review she has COPD she is currently on Advair and albuterol however she found an old Atrovent that she did use in the past and feels like that works better like to switch it is also cheaper.  Chronic insomnia she typically uses Ambien however her insurance only allows so many tablets per year then she switches to Klonopin she's currently on Klonopin 0.5 mg she states this does not work as well she would like to try a higher dose.    Review Of Systems:  GEN- denies fatigue, fever, weight loss,weakness, recent illness HEENT- denies eye drainage, change in vision, nasal discharge, CVS- denies chest pain, palpitations RESP- denies SOB, cough, wheeze ABD- denies N/V, change in stools, abd pain GU-+dysuria, denies  hematuria, dribbling, incontinence MSK- denies joint pain, muscle aches, injury Neuro- denies headache, dizziness, syncope, seizure activity       Objective:    BP 132/70 mmHg  Pulse 76  Temp(Src) 98.6 F (37 C) (Oral)  Resp 16  Ht 5\' 3"  (1.6 m)  Wt 116 lb (52.617 kg)  BMI 20.55 kg/m2 GEN- NAD, alert and oriented x3 CVS- RRR, no murmur RESP-few scattered wheeze,normal WOB  ABD-NABS,soft,NT,ND, no CVA tenderness EXT- No edema Pulses- Radial 2+        Assessment & Plan:      Problem List Items Addressed This Visit    COPD (chronic obstructive pulmonary disease) ([place])    Change to atroven, continue advair Overall doing well with regards to breathing, no recent exacerbations       Relevant Medications   ipratropium (ATROVENT HFA) 17 MCG/ACT inhaler   Chronic insomnia    Chronic insomnia. Due to insurance she uses both Ambien and clonazepam. We'll have her try 1 mg at home which she artery has a supply of 0.5 mg my concern is that she will have too much daytime somnolence. Another option is to try trazodone or doxepin if she has not used these already she will call back and see how she does with the 1 mg of Klonopin        Other Visit Diagnoses    UTI (lower urinary tract infection)    -  Primary    Start Cipro, send for culture , no red flags     Relevant Orders    Urinalysis, Routine w reflex microscopic (not at [facility]) (Completed)    Urine culture       Note: This dictation was prepared with Dragon dictation along with smaller phrase technology. Any transcriptional errors that result from this process are unintentional.

## 2016-06-09 NOTE — Assessment & Plan Note (Signed)
Chronic insomnia. Due to insurance she uses both Ambien and clonazepam. We'll have her try 1 mg at home which she artery has a supply of 0.5 mg my concern is that she will have too much daytime somnolence. Another option is to try trazodone or doxepin if she has not used these already she will call back and see how she does with the 1 mg of Klonopin

## 2016-06-10 LAB — URINE CULTURE

## 2016-06-13 ENCOUNTER — Telehealth: Payer: Self-pay | Admitting: *Deleted

## 2016-06-13 MED ORDER — CLONAZEPAM 0.5 MG PO TABS: 0.5000 mg | ORAL_TABLET | Freq: Every day | ORAL | Status: DC

## 2016-06-13 NOTE — Telephone Encounter (Signed)
Received call from patient.   Requested refill on Clonazepam sent to [facility].   Ok to refill??  Last office visit 06/09/2016.  Last refill 10/23/2015, #2 refills.

## 2016-06-13 NOTE — Telephone Encounter (Signed)
Medication called to pharmacy. 

## 2016-06-13 NOTE — Telephone Encounter (Signed)
ok 

## 2016-06-24 ENCOUNTER — Ambulatory Visit (INDEPENDENT_AMBULATORY_CARE_PROVIDER_SITE_OTHER): Payer: Medicare Other | Admitting: Family Medicine

## 2016-06-24 ENCOUNTER — Encounter: Payer: Self-pay | Admitting: Family Medicine

## 2016-06-24 VITALS — BP 142/78 | HR 88 | Temp 98.2°F | Resp 16 | Ht 63.0 in | Wt 115.0 lb

## 2016-06-24 DIAGNOSIS — N390 Urinary tract infection, site not specified: Secondary | ICD-10-CM | POA: Diagnosis not present

## 2016-06-24 DIAGNOSIS — R300 Dysuria: Secondary | ICD-10-CM | POA: Diagnosis not present

## 2016-06-24 LAB — URINALYSIS, ROUTINE W REFLEX MICROSCOPIC
Bilirubin Urine: NEGATIVE
Glucose, UA: NEGATIVE
KETONES UR: NEGATIVE
NITRITE: NEGATIVE
PH: 5.5 (ref 5.0–8.0)
PROTEIN: NEGATIVE
Specific Gravity, Urine: 1.015 (ref 1.001–1.035)

## 2016-06-24 LAB — URINALYSIS, MICROSCOPIC ONLY
Bacteria, UA: NONE SEEN [HPF]
CASTS: NONE SEEN [LPF]
Crystals: NONE SEEN [HPF]
Yeast: NONE SEEN [HPF]

## 2016-06-24 MED ORDER — CEFTRIAXONE SODIUM 1 G IJ SOLR
500.0000 mg | Freq: Once | INTRAMUSCULAR | Status: AC
  Administered 2016-06-24: 500 mg via INTRAMUSCULAR

## 2016-06-24 MED ORDER — CLOTRIMAZOLE 1 % EX CREA: 1.0000 "application " | TOPICAL_CREAM | Freq: Two times a day (BID) | CUTANEOUS | Status: DC

## 2016-06-24 MED ORDER — PHENAZOPYRIDINE HCL 100 MG PO TABS: 100.0000 mg | ORAL_TABLET | Freq: Three times a day (TID) | ORAL | Status: DC | PRN

## 2016-06-24 NOTE — Patient Instructions (Addendum)
Rocephin shot given today  Take the pyridum as prescribed Use cream twice a day as needed  F/U as needed

## 2016-06-24 NOTE — Progress Notes (Signed)
Patient ID: [name], female   DOB: [birth year], 75 y.o.   MRN: [id number]   Subjective:    Patient ID: [name], female    DOB: August 24, 1941, 75 y.o.   MRN: [id number]  Patient presents for Dysuria Patient here with recurrent burning with urination. She states after her last visit which was on the third it is not completely clear but she felt much better with Cipro. The past few days she's had increased burning with urination no abdominal pain no fever no vomiting her chart notes history of chronic UTI she states that she saw a urologist in the past but she does not remember specifics. She also states that she has had some itching on the outside of her vagina at the end of the visit  Note her blood pressure was elevated when she initially came in after settling down a came down on its own. She is not on any antihypertensive medications. She states her blood pressure will fluctuate at times which she come to the doctor's office    Review Of Systems:  GEN- denies fatigue, fever, weight loss,weakness, recent illness HEENT- denies eye drainage, change in vision, nasal discharge, CVS- denies chest pain, palpitations RESP- denies SOB, cough, wheeze ABD- denies N/V, change in stools, abd pain GU- denies dysuria, hematuria, dribbling, incontinence MSK- denies joint pain, muscle aches, injury Neuro- denies headache, dizziness, syncope, seizure activity       Objective:    BP 142/78 mmHg  Pulse 88  Temp(Src) 98.2 F (36.8 C) (Oral)  Resp 16  Ht 5\' 3"  (1.6 m)  Wt 115 lb (52.164 kg)  BMI 20.38 kg/m2 GEN- NAD, alert and oriented x3 HEENT- PERRL, EOMI, non injected sclera, pink conjunctiva, MMM, oropharynx clear CVS- RRR, no murmur RESP-occasional wheeze, normal WOB  ABD-NABS,soft,NT,ND, no CVA tenderness  EXT- No edema Pulses- Radial 2+        Assessment & Plan:      Problem List Items Addressed This Visit    Chronic UTI   Relevant Medications   phenazopyridine  (PYRIDIUM) 100 MG tablet   clotrimazole (LOTRIMIN) 1 % cream   cefTRIAXone (ROCEPHIN) injection 500 mg (Completed)    Other Visit Diagnoses    Dysuria    -  Primary    recurrent UTI symptoms, last culture NEG, given Rocephin injection, repeat culture, pyridum, also complained of some external itching ? atrophic vaginitis causing some of her symptoms. Given clotrimazole cream to try first     Relevant Medications    cefTRIAXone (ROCEPHIN) injection 500 mg (Completed)    Other Relevant Orders    Urinalysis, Routine w reflex microscopic (not at [facility]) (Completed)    Urine culture       Note: This dictation was prepared with Dragon dictation along with smaller phrase technology. Any transcriptional errors that result from this process are unintentional.

## 2016-06-26 LAB — URINE CULTURE: Colony Count: >=100000

## 2016-06-27 ENCOUNTER — Telehealth: Payer: Self-pay | Admitting: *Deleted

## 2016-06-27 MED ORDER — CEPHALEXIN 500 MG PO CAPS: 500.0000 mg | ORAL_CAPSULE | Freq: Four times a day (QID) | ORAL | Status: DC

## 2016-06-27 NOTE — Telephone Encounter (Signed)
Called to inform that Keflex is being sent for her UTI QID x 5days

## 2016-06-30 DIAGNOSIS — D225 Melanocytic nevi of trunk: Secondary | ICD-10-CM | POA: Diagnosis not present

## 2016-06-30 DIAGNOSIS — D2271 Melanocytic nevi of right lower limb, including hip: Secondary | ICD-10-CM | POA: Diagnosis not present

## 2016-06-30 DIAGNOSIS — C44311 Basal cell carcinoma of skin of nose: Secondary | ICD-10-CM | POA: Diagnosis not present

## 2016-07-15 DIAGNOSIS — H524 Presbyopia: Secondary | ICD-10-CM | POA: Diagnosis not present

## 2016-07-15 DIAGNOSIS — H52223 Regular astigmatism, bilateral: Secondary | ICD-10-CM | POA: Diagnosis not present

## 2016-07-15 DIAGNOSIS — H5211 Myopia, right eye: Secondary | ICD-10-CM | POA: Diagnosis not present

## 2016-07-18 ENCOUNTER — Other Ambulatory Visit: Payer: Self-pay | Admitting: Family Medicine

## 2016-07-18 DIAGNOSIS — Z1231 Encounter for screening mammogram for malignant neoplasm of breast: Secondary | ICD-10-CM

## 2016-08-27 ENCOUNTER — Ambulatory Visit (HOSPITAL_COMMUNITY)
Admission: RE | Admit: 2016-08-27 | Discharge: 2016-08-27 | Disposition: A | Discharge status: Home / Self Care | Payer: Medicare Other | Source: Ambulatory Visit | Attending: Family Medicine | Admitting: Family Medicine

## 2016-08-27 DIAGNOSIS — Z1231 Encounter for screening mammogram for malignant neoplasm of breast: Secondary | ICD-10-CM | POA: Diagnosis not present

## 2016-09-09 ENCOUNTER — Other Ambulatory Visit: Payer: Self-pay | Admitting: Family Medicine

## 2016-09-10 NOTE — Telephone Encounter (Signed)
Ok to refill 

## 2016-09-11 NOTE — Telephone Encounter (Signed)
ok 

## 2016-09-11 NOTE — Telephone Encounter (Signed)
Medication called to pharmacy. 

## 2016-10-07 ENCOUNTER — Other Ambulatory Visit: Payer: Self-pay | Admitting: Family Medicine

## 2016-10-07 NOTE — Telephone Encounter (Signed)
ok 

## 2016-10-07 NOTE — Telephone Encounter (Signed)
Ok to refill 

## 2016-10-08 NOTE — Telephone Encounter (Signed)
Medication called to pharmacy. 

## 2016-10-20 ENCOUNTER — Ambulatory Visit (INDEPENDENT_AMBULATORY_CARE_PROVIDER_SITE_OTHER): Payer: Medicare Other | Admitting: Family Medicine

## 2016-10-20 ENCOUNTER — Other Ambulatory Visit: Payer: Self-pay | Admitting: Family Medicine

## 2016-10-20 ENCOUNTER — Other Ambulatory Visit: Payer: Medicare Other

## 2016-10-20 DIAGNOSIS — Z0000 Encounter for general adult medical examination without abnormal findings: Secondary | ICD-10-CM

## 2016-10-20 DIAGNOSIS — E46 Unspecified protein-calorie malnutrition: Secondary | ICD-10-CM

## 2016-10-20 DIAGNOSIS — J449 Chronic obstructive pulmonary disease, unspecified: Secondary | ICD-10-CM | POA: Diagnosis not present

## 2016-10-20 DIAGNOSIS — Z23 Encounter for immunization: Secondary | ICD-10-CM | POA: Diagnosis not present

## 2016-10-20 DIAGNOSIS — E785 Hyperlipidemia, unspecified: Secondary | ICD-10-CM

## 2016-10-20 DIAGNOSIS — Z79899 Other long term (current) drug therapy: Secondary | ICD-10-CM | POA: Diagnosis not present

## 2016-10-20 DIAGNOSIS — M810 Age-related osteoporosis without current pathological fracture: Secondary | ICD-10-CM

## 2016-10-20 LAB — CBC WITH DIFFERENTIAL/PLATELET
BASOS ABS: 93 {cells}/uL (ref 0–200)
Basophils Relative: 1 %
EOS PCT: 4 %
Eosinophils Absolute: 372 cells/uL (ref 15–500)
HCT: 47.4 % — ABNORMAL HIGH (ref 35.0–45.0)
Hemoglobin: 15.8 g/dL — ABNORMAL HIGH (ref 12.0–15.0)
Lymphocytes Relative: 29 %
Lymphs Abs: 2697 cells/uL (ref 850–3900)
MCH: 31.9 pg (ref 27.0–33.0)
MCHC: 33.3 g/dL (ref 32.0–36.0)
MCV: 95.6 fL (ref 80.0–100.0)
MONOS PCT: 6 %
MPV: 10.8 fL (ref 7.5–12.5)
Monocytes Absolute: 558 cells/uL (ref 200–950)
NEUTROS ABS: 5580 {cells}/uL (ref 1500–7800)
Neutrophils Relative %: 60 %
PLATELETS: 429 10*3/uL — AB (ref 140–400)
RBC: 4.96 MIL/uL (ref 3.80–5.10)
RDW: 15.1 % — ABNORMAL HIGH (ref 11.0–15.0)
WBC: 9.3 10*3/uL (ref 3.8–10.8)

## 2016-10-20 LAB — COMPLETE METABOLIC PANEL WITH GFR
ALBUMIN: 4.3 g/dL (ref 3.6–5.1)
ALT: 20 U/L (ref 6–29)
AST: 30 U/L (ref 10–35)
Alkaline Phosphatase: 122 U/L (ref 33–130)
BILIRUBIN TOTAL: 0.7 mg/dL (ref 0.2–1.2)
BUN: 23 mg/dL (ref 7–25)
CO2: 29 mmol/L (ref 20–31)
CREATININE: 0.51 mg/dL — AB (ref 0.60–0.93)
Calcium: 9.8 mg/dL (ref 8.6–10.4)
Chloride: 103 mmol/L (ref 98–110)
GFR, Est African American: >89 mL/min (ref >=60)
GFR, Est Non African American: >89 mL/min (ref >=60)
GLUCOSE: 80 mg/dL (ref 70–99)
Potassium: 4.6 mmol/L (ref 3.5–5.3)
SODIUM: 140 mmol/L (ref 135–146)
TOTAL PROTEIN: 7.1 g/dL (ref 6.1–8.1)

## 2016-10-20 LAB — LIPID PANEL
Cholesterol: 226 mg/dL — ABNORMAL HIGH (ref <200)
HDL: 70 mg/dL (ref >50)
LDL CALC: 140 mg/dL — AB (ref <100)
TRIGLYCERIDES: 80 mg/dL (ref <150)
Total CHOL/HDL Ratio: 3.2 Ratio (ref <5.0)
VLDL: 16 mg/dL (ref <30)

## 2016-10-21 LAB — TSH: TSH: 0.77 mIU/L

## 2016-10-21 LAB — VITAMIN D 25 HYDROXY (VIT D DEFICIENCY, FRACTURES): VIT D 25 HYDROXY: 41 ng/mL (ref 30–100)

## 2016-10-27 ENCOUNTER — Encounter: Payer: Self-pay | Admitting: Family Medicine

## 2016-10-27 ENCOUNTER — Ambulatory Visit (INDEPENDENT_AMBULATORY_CARE_PROVIDER_SITE_OTHER): Payer: Medicare Other | Admitting: Family Medicine

## 2016-10-27 VITALS — BP 166/84 | HR 96 | Temp 98.3°F | Resp 20 | Ht 63.0 in | Wt 115.0 lb

## 2016-10-27 DIAGNOSIS — Z0000 Encounter for general adult medical examination without abnormal findings: Secondary | ICD-10-CM | POA: Diagnosis not present

## 2016-10-27 MED ORDER — VALSARTAN 160 MG PO TABS: 160.0000 mg | ORAL_TABLET | Freq: Every day | ORAL | 5 refills | Status: DC

## 2016-10-27 NOTE — Progress Notes (Signed)
Subjective:    Patient ID: [name], female    DOB: 07-20-1941, 75 y.o.   MRN: [id number]  HPI Patient is here today for complete physical exam.  Mammogram was performed in September and was normal. Her last colonoscopy was in 2013 and is not due again until 2023. Due to a she does not require a Pap smear. Her bone density is significant for osteoporosis however she is failed 3 different bisphosphonates as well as prolia and refuses to try any other medications.  She is currently on calcium as well as vitamin D. She recently gave up chocolate cholesterol is better. However her blood pressure is elevated. It has been running this high for the last month. She's been checking it at home as well Appointment on 10/20/2016  Component Date Value Ref Range Status  . Sodium 10/20/2016 140  135 - 146 mmol/L Final  . Potassium 10/20/2016 4.6  3.5 - 5.3 mmol/L Final  . Chloride 10/20/2016 103  98 - 110 mmol/L Final  . CO2 10/20/2016 29  20 - 31 mmol/L Final  . Glucose, Bld 10/20/2016 80  70 - 99 mg/dL Final  . BUN 10/20/2016 23  7 - 25 mg/dL Final  . Creat 10/20/2016 0.51* 0.60 - 0.93 mg/dL Final   Comment:   For patients > or = 74 years of age: The upper reference limit for Creatinine is approximately 13% higher for people identified as African-American.     . Total Bilirubin 10/20/2016 0.7  0.2 - 1.2 mg/dL Final  . Alkaline Phosphatase 10/20/2016 122  33 - 130 U/L Final  . AST 10/20/2016 30  10 - 35 U/L Final  . ALT 10/20/2016 20  6 - 29 U/L Final  . Total Protein 10/20/2016 7.1  6.1 - 8.1 g/dL Final  . Albumin 10/20/2016 4.3  3.6 - 5.1 g/dL Final  . Calcium 10/20/2016 9.8  8.6 - 10.4 mg/dL Final  . GFR, Est African American 10/20/2016 >89  >=60 mL/min Final  . GFR, Est Non African American 10/20/2016 >89  >=60 mL/min Final  . TSH 10/21/2016 0.77  mIU/L Final   Comment:   Reference Range   > or = 20 Years  0.40-4.50   Pregnancy Range First trimester  0.26-2.66 Second trimester  0.55-2.73 Third trimester  0.43-2.91     . Cholesterol 10/20/2016 226* <200 mg/dL Final   Comment: ** Please note change in reference range(s). **     . Triglycerides 10/20/2016 80  <150 mg/dL Final   Comment: ** Please note change in reference range(s). **     . HDL 10/20/2016 70  >50 mg/dL Final   Comment: ** Please note change in reference range(s). **     . Total CHOL/HDL Ratio 10/20/2016 3.2  <5.0 Ratio Final  . VLDL 10/20/2016 16  <30 mg/dL Final  . LDL Cholesterol 10/20/2016 140* <100 mg/dL Final   Comment: ** Please note change in reference range(s). **     . WBC 10/20/2016 9.3  3.8 - 10.8 K/uL Final  . RBC 10/20/2016 4.96  3.80 - 5.10 MIL/uL Final  . Hemoglobin 10/20/2016 15.8* 12.0 - 15.0 g/dL Final  . HCT 10/20/2016 47.4* 35.0 - 45.0 % Final  . MCV 10/20/2016 95.6  80.0 - 100.0 fL Final  . MCH 10/20/2016 31.9  27.0 - 33.0 pg Final  . MCHC 10/20/2016 33.3  32.0 - 36.0 g/dL Final  . RDW 10/20/2016 15.1* 11.0 - 15.0 % Final  . Platelets 10/20/2016 429*  140 - 400 K/uL Final  . MPV 10/20/2016 10.8  7.5 - 12.5 fL Final  . Neutro Abs 10/20/2016 5580  1,500 - 7,800 cells/uL Final  . Lymphs Abs 10/20/2016 2697  850 - 3,900 cells/uL Final  . Monocytes Absolute 10/20/2016 558  200 - 950 cells/uL Final  . Eosinophils Absolute 10/20/2016 372  15 - 500 cells/uL Final  . Basophils Absolute 10/20/2016 93  0 - 200 cells/uL Final  . Neutrophils Relative % 10/20/2016 60  % Final  . Lymphocytes Relative 10/20/2016 29  % Final  . Monocytes Relative 10/20/2016 6  % Final  . Eosinophils Relative 10/20/2016 4  % Final  . Basophils Relative 10/20/2016 1  % Final  . Smear Review 10/20/2016 Criteria for review not met   Final  . Vit D, 25-Hydroxy 10/21/2016 41  30 - 100 ng/mL Final   Comment: Vitamin D Status           25-OH Vitamin D        Deficiency                <20 ng/mL        Insufficiency         20 - 29 ng/mL        Optimal             > or = 30 ng/mL   For 25-OH Vitamin D  testing on patients on D2-supplementation and patients for whom quantitation of D2 and D3 fractions is required, the QuestAssureD 25-OH VIT D, (D2,D3), LC/MS/MS is recommended: order code [id number] (patients > 2 yrs).    Past Medical History:  Diagnosis Date  . BV (bacterial vaginosis)   . Chronic UTI   . COPD (chronic obstructive pulmonary disease) ([place])   . FH: cataracts   . Hyperlipidemia   . Osteoporosis    Past Surgical History:  Procedure Laterality Date  . COLONOSCOPY  04/19/2012   Procedure: COLONOSCOPY;  Surgeon: Danie Binder, MD;  Location: [facility];  Service: Endoscopy;  Laterality: N/A;  11:30 AM  . FRACTURE SURGERY     rt left knee cap   Current Outpatient Prescriptions on File Prior to Visit  Medication Sig Dispense Refill  . ADVAIR DISKUS 250-50 MCG/DOSE AEPB INHALE ONE PUFF INTO THE LUNGS TWICE DAILY. 60 each 11  . clonazePAM (KLONOPIN) 0.5 MG tablet TAKE 1 TABLET BY MOUTH AT BEDTIME. 30 tablet 0  . clotrimazole (LOTRIMIN) 1 % cream Apply 1 application topically 2 (two) times daily. 30 g 0  . zolpidem (AMBIEN) 10 MG tablet TAKE 1 TABLET BY MOUTH AT BEDTIME. (Patient not taking: Reported on 10/27/2016) 30 tablet 1   No current facility-administered medications on file prior to visit.    Allergies  Allergen Reactions  . Naproxen Sodium Nausea And Vomiting  . Symbicort [Budesonide-Formoterol Fumarate] Shortness Of Breath    Breathing difficulty  . Alendronate Sodium Other (See Comments)    Kidney failure  . Boniva [Ibandronate Sodium] Other (See Comments)    Kidney failure   . Risedronate Sodium   . Zetia [Ezetimibe] Other (See Comments)    Muscle pain   Social History   Social History  . Marital status: Married    Spouse name: N/A  . Number of children: N/A  . Years of education: N/A   Occupational History  . Not on file.   Social History Main Topics  . Smoking status: Former Research scientist (life sciences)  . Smokeless tobacco: Never Used  .  Alcohol use No  . Drug  use: No  . Sexual activity: Not on file     Comment: married, husband smokes.   Other Topics Concern  . Not on file   Social History Narrative  . No narrative on file   Family History  Problem Relation Age of Onset  . Cancer Father     pancreatic      Review of Systems  All other systems reviewed and are negative.      Objective:   Physical Exam  Constitutional: She is oriented to person, place, and time. She appears well-developed and well-nourished. No distress.  HENT:  Head: Normocephalic and atraumatic.  Right Ear: External ear normal.  Left Ear: External ear normal.  Nose: Nose normal.  Mouth/Throat: Oropharynx is clear and moist. No oropharyngeal exudate.  Eyes: Conjunctivae and EOM are normal. Pupils are equal, round, and reactive to light. Right eye exhibits no discharge. Left eye exhibits no discharge. No scleral icterus.  Neck: Normal range of motion. Neck supple. No JVD present. No tracheal deviation present. No thyromegaly present.  Cardiovascular: Normal rate, regular rhythm, normal heart sounds and intact distal pulses.  Exam reveals no gallop and no friction rub.   No murmur heard. Pulmonary/Chest: No accessory muscle usage or stridor. No respiratory distress. She has no decreased breath sounds. She has no wheezes. She has no rhonchi. She has no rales.  Abdominal: Soft. Bowel sounds are normal. She exhibits no distension and no mass. There is no tenderness. There is no rebound and no guarding.  Musculoskeletal: Normal range of motion. She exhibits no edema or tenderness.  Lymphadenopathy:    She has no cervical adenopathy.  Neurological: She is alert and oriented to person, place, and time. She has normal reflexes. No cranial nerve deficit. She exhibits normal muscle tone. Coordination normal.  Skin: Skin is warm. No rash noted. She is not diaphoretic. No erythema. No pallor.  Psychiatric: She has a normal mood and affect. Her behavior is normal. Judgment and  thought content normal.  Vitals reviewed.         Assessment & Plan:  Routine general medical examination at a health care facility  We will start the patient on valsartan 160 mg by mouth daily for hypertension and recheck her blood pressure in one month. I recommended a shingles vaccine however she cannot afford it. She declined the tetanus shot. The remainder of her immunizations are up-to-date: Immunization History  Administered Date(s) Administered  . Influenza Whole 08/30/2010  . Influenza,inj,Quad PF,36+ Mos 09/12/2013, 10/12/2014, 10/09/2015, 10/20/2016  . Pneumococcal Conjugate-13 04/03/2014  . Pneumococcal Polysaccharide-23 04/29/2004, 03/26/2011  . Td 04/29/2004   Cancer screening is up-to-date. She refuses all therapy for osteoporosis. Her lab work is excellent. No changes in medication at this time. Continue take calcium 1200 mg a day and vitamin D 1000 units a day

## 2016-11-07 ENCOUNTER — Other Ambulatory Visit: Payer: Self-pay | Admitting: Family Medicine

## 2016-11-07 NOTE — Telephone Encounter (Signed)
Approved. # [phone] + 0. 

## 2016-11-07 NOTE — Telephone Encounter (Signed)
Ok to refill 

## 2016-11-17 ENCOUNTER — Encounter: Payer: Self-pay | Admitting: Family Medicine

## 2016-11-17 ENCOUNTER — Ambulatory Visit (INDEPENDENT_AMBULATORY_CARE_PROVIDER_SITE_OTHER): Payer: Medicare Other | Admitting: Family Medicine

## 2016-11-17 VITALS — BP 168/70 | HR 84 | Temp 97.8°F | Resp 20 | Ht 63.0 in | Wt 116.0 lb

## 2016-11-17 DIAGNOSIS — I10 Essential (primary) hypertension: Secondary | ICD-10-CM

## 2016-11-17 LAB — BASIC METABOLIC PANEL WITH GFR
BUN: 21 mg/dL (ref 7–25)
CALCIUM: 9.6 mg/dL (ref 8.6–10.4)
CHLORIDE: 103 mmol/L (ref 98–110)
CO2: 27 mmol/L (ref 20–31)
CREATININE: 0.5 mg/dL — AB (ref 0.60–0.93)
GFR, Est African American: >89 mL/min (ref >=60)
GFR, Est Non African American: >89 mL/min (ref >=60)
GLUCOSE: 91 mg/dL (ref 70–99)
Potassium: 4.3 mmol/L (ref 3.5–5.3)
Sodium: 140 mmol/L (ref 135–146)

## 2016-11-17 MED ORDER — VALSARTAN-HYDROCHLOROTHIAZIDE 160-25 MG PO TABS: 1.0000 | ORAL_TABLET | Freq: Every day | ORAL | 3 refills | Status: DC

## 2016-11-17 MED ORDER — ALBUTEROL SULFATE HFA 108 (90 BASE) MCG/ACT IN AERS: 2.0000 | INHALATION_SPRAY | Freq: Four times a day (QID) | RESPIRATORY_TRACT | 6 refills | Status: DC | PRN

## 2016-11-17 NOTE — Progress Notes (Signed)
Subjective:    Patient ID: [name], female    DOB: Sep 11, 1941, 75 y.o.   MRN: [id number]  HPI Please see the patient's last office visit. Her blood pressure was elevated at her physical exam and I suggested that we start her on valsartan 160 mg by mouth daily and then recheck her blood pressure in a month. She is here today for follow-up.  Her blood pressure today is no better. She has been checking her blood pressure at home with her own cuff and finding her blood pressure running between [id number] and 0000000 systolic. She denies any side effects on the medication and she is not missing any doses of the medication. She is not exercising but she is also not eating a lot of salt.  Past Medical History:  Diagnosis Date  . BV (bacterial vaginosis)   . Chronic UTI   . COPD (chronic obstructive pulmonary disease) ([place])   . FH: cataracts   . Hyperlipidemia   . Osteoporosis    Past Surgical History:  Procedure Laterality Date  . COLONOSCOPY  04/19/2012   Procedure: COLONOSCOPY;  Surgeon: Danie Binder, MD;  Location[facility];  Service: Endoscopy;  Laterality: N/A;  11:30 AM  . FRACTURE SURGERY     rt left knee cap   Current Outpatient Prescriptions on File Prior to Visit  Medication Sig Dispense Refill  . ADVAIR DISKUS 250-50 MCG/DOSE AEPB INHALE ONE PUFF INTO THE LUNGS TWICE DAILY. 60 each 11  . albuterol (PROAIR HFA) 108 (90 Base) MCG/ACT inhaler Inhale 2 puffs into the lungs every 6 (six) hours as needed for wheezing or shortness of breath.    . clonazePAM (KLONOPIN) 0.5 MG tablet TAKE 1 TABLET BY MOUTH AT BEDTIME. 30 tablet 0  . clotrimazole (LOTRIMIN) 1 % cream Apply 1 application topically 2 (two) times daily. 30 g 0  . valsartan (DIOVAN) 160 MG tablet Take 1 tablet (160 mg total) by mouth daily. 30 tablet 5  . Vitamin D, Cholecalciferol, 1000 units CAPS Take 2 tablets by mouth daily.    Marland Kitchen zolpidem (AMBIEN) 10 MG tablet TAKE 1 TABLET BY MOUTH AT BEDTIME. (Patient not taking:  Reported on 11/17/2016) 30 tablet 1   No current facility-administered medications on file prior to visit.    Allergies  Allergen Reactions  . Naproxen Sodium Nausea And Vomiting  . Symbicort [Budesonide-Formoterol Fumarate] Shortness Of Breath    Breathing difficulty  . Alendronate Sodium Other (See Comments)    Kidney failure  . Boniva [Ibandronate Sodium] Other (See Comments)    Kidney failure   . Risedronate Sodium   . Zetia [Ezetimibe] Other (See Comments)    Muscle pain   Social History   Social History  . Marital status: Married    Spouse name: N/A  . Number of children: N/A  . Years of education: N/A   Occupational History  . Not on file.   Social History Main Topics  . Smoking status: Former Research scientist (life sciences)  . Smokeless tobacco: Never Used  . Alcohol use No  . Drug use: No  . Sexual activity: Not on file     Comment: married, husband smokes.   Other Topics Concern  . Not on file   Social History Narrative  . No narrative on file      Review of Systems  All other systems reviewed and are negative.      Objective:   Physical Exam  Cardiovascular: Normal rate, regular rhythm and normal heart  sounds.   Pulmonary/Chest: Effort normal and breath sounds normal. No respiratory distress. She has no wheezes. She has no rales.  Abdominal: Soft. Bowel sounds are normal.  Musculoskeletal: She exhibits no edema.  Vitals reviewed.         Assessment & Plan:  Essential hypertension - Plan: BASIC METABOLIC PANEL WITH GFR  Discontinue Diovan and replace with Diovan HCTZ 160/25 one by mouth daily. Recheck blood pressure in 3 weeks. Recheck renal function today

## 2016-12-10 ENCOUNTER — Other Ambulatory Visit: Payer: Self-pay | Admitting: Family Medicine

## 2016-12-10 NOTE — Telephone Encounter (Signed)
Ok to refill 

## 2016-12-11 NOTE — Telephone Encounter (Signed)
Medication called to pharmacy. 

## 2016-12-11 NOTE — Telephone Encounter (Signed)
Ok with 3 refills

## 2017-01-19 ENCOUNTER — Other Ambulatory Visit: Payer: Self-pay | Admitting: Family Medicine

## 2017-01-19 NOTE — Telephone Encounter (Signed)
Ok to refill 

## 2017-01-19 NOTE — Telephone Encounter (Signed)
ok 

## 2017-01-20 ENCOUNTER — Encounter: Payer: Self-pay | Admitting: Family Medicine

## 2017-01-20 ENCOUNTER — Ambulatory Visit (INDEPENDENT_AMBULATORY_CARE_PROVIDER_SITE_OTHER): Payer: Medicare Other | Admitting: Family Medicine

## 2017-01-20 VITALS — BP 132/64 | HR 76 | Temp 98.1°F | Resp 14 | Ht 63.0 in | Wt 113.0 lb

## 2017-01-20 DIAGNOSIS — J441 Chronic obstructive pulmonary disease with (acute) exacerbation: Secondary | ICD-10-CM | POA: Diagnosis not present

## 2017-01-20 MED ORDER — PREDNISONE 10 MG PO TABS: ORAL_TABLET | ORAL | 0 refills | Status: DC

## 2017-01-20 NOTE — Assessment & Plan Note (Signed)
Appears to be at the end of her exacerbation. She still has some mild wheezing think she can rebound quickly back into one therefore given her prescription for prednisone to have on hand at home. She will continue her Advair as well as her albuterol as prescribed

## 2017-01-20 NOTE — Patient Instructions (Signed)
Get the prednisone to have at home Use your inhalers F/U as needed

## 2017-01-20 NOTE — Progress Notes (Signed)
   Subjective:    Patient ID: [name], female    DOB: [birth year], 76 y.o.   MRN: [id number]  Patient presents for Illness (states that she thinks she had bronchitis x1 week prior, and wants to check her lungs now)  Pt here with recent COPD exacerbation she had a lot of mucous, coughing wheezing last week, feels better today. No fever.  she has history of COPD she is using albuterol and Advair She does have a nebulizer at home. Has not had to use this., No fever, no sinus drainage   HTN- home reading around [id number] systolic   Review Of Systems:  GEN- denies fatigue, fever, weight loss,weakness, recent illness HEENT- denies eye drainage, change in vision, nasal discharge, CVS- denies chest pain, palpitations RESP- denies SOB, cough, wheeze ABD- denies N/V, change in stools, abd pain GU- denies dysuria, hematuria, dribbling, incontinence MSK- denies joint pain, muscle aches, injury Neuro- denies headache, dizziness, syncope, seizure activity       Objective:    BP 132/64   Pulse 76   Temp 98.1 F (36.7 C) (Oral)   Resp 14   Ht 5\' 3"  (1.6 m)   Wt 113 lb (51.3 kg)   SpO2 94%   BMI 20.02 kg/m   GEN- NAD, alert and oriented x3 HEENT- PERRL, EOMI, non injected sclera, pink conjunctiva, MMM, oropharynx clear Neck- Supple, NO LAD  CVS- RRR, no murmur RESP- occasional expiratory wheeze  EXT- No edema Pulses- Radial 2+         Assessment & Plan:      Problem List Items Addressed This Visit    COPD exacerbation ([place]) - Primary    Appears to be at the end of her exacerbation. She still has some mild wheezing think she can rebound quickly back into one therefore given her prescription for prednisone to have on hand at home. She will continue her Advair as well as her albuterol as prescribed      Relevant Medications   predniSONE (DELTASONE) 10 MG tablet      Note: This dictation was prepared with Dragon dictation along with smaller phrase technology. Any  transcriptional errors that result from this process are unintentional.

## 2017-02-20 ENCOUNTER — Other Ambulatory Visit: Payer: Self-pay | Admitting: Family Medicine

## 2017-02-20 NOTE — Telephone Encounter (Signed)
Per MD, prescription approved.   Medication called to pharmacy.

## 2017-02-20 NOTE — Telephone Encounter (Signed)
Ok to refill 

## 2017-03-25 ENCOUNTER — Other Ambulatory Visit: Payer: Self-pay | Admitting: Family Medicine

## 2017-04-06 ENCOUNTER — Other Ambulatory Visit: Payer: Self-pay | Admitting: Family Medicine

## 2017-04-06 NOTE — Telephone Encounter (Signed)
ok 

## 2017-04-06 NOTE — Telephone Encounter (Signed)
Ok to refill 

## 2017-04-07 NOTE — Telephone Encounter (Signed)
Medication called to pharmacy. 

## 2017-04-23 ENCOUNTER — Other Ambulatory Visit: Payer: Self-pay | Admitting: Family Medicine

## 2017-05-12 ENCOUNTER — Ambulatory Visit (INDEPENDENT_AMBULATORY_CARE_PROVIDER_SITE_OTHER): Payer: Medicare Other | Admitting: Family Medicine

## 2017-05-12 ENCOUNTER — Encounter: Payer: Self-pay | Admitting: Family Medicine

## 2017-05-12 VITALS — BP 130/68 | HR 104 | Temp 97.8°F | Resp 18 | Ht 63.0 in | Wt 112.0 lb

## 2017-05-12 DIAGNOSIS — I10 Essential (primary) hypertension: Secondary | ICD-10-CM

## 2017-05-12 DIAGNOSIS — J418 Mixed simple and mucopurulent chronic bronchitis: Secondary | ICD-10-CM | POA: Diagnosis not present

## 2017-05-12 MED ORDER — VALSARTAN-HYDROCHLOROTHIAZIDE 160-25 MG PO TABS: 0.5000 | ORAL_TABLET | Freq: Every day | ORAL | 11 refills | Status: DC

## 2017-05-12 NOTE — Progress Notes (Signed)
   Subjective:    Patient ID: [name], female    DOB: 04/11/1941, 76 y.o.   MRN: [id number]  Patient presents for BP Issues (states that she stopped taking Valsartan/ HCTZ due to hypotension- states that she took her spouse's Norvasc 10mg  and felt fine) and Cough (productive cough with yellow colored mucus)   Pt here with valsartan HCTZ, at home BP was 110/70 for 3 weeks and she felt "bad",had no energy, no dizziness   started taking husbands 10mg  of norvasc  Inpast was on valsartan by istelf, then changed to vaslartan HCTZ     COPD-  has some increaesd mucous, no wheezing, no tightness in chest, using advair, albuterol   Review Of Systems:  GEN- denies fatigue, fever, weight loss,weakness, recent illness HEENT- denies eye drainage, change in vision, nasal discharge, CVS- denies chest pain, palpitations RESP- denies SOB, cough, wheeze ABD- denies N/V, change in stools, abd pain GU- denies dysuria, hematuria, dribbling, incontinence MSK- denies joint pain, muscle aches, injury Neuro- denies headache, dizziness, syncope, seizure activity       Objective:    BP 130/68   Pulse (!) 104   Temp 97.8 F (36.6 C) (Oral)   Resp 18   Ht 5\' 3"  (1.6 m)   Wt 112 lb (50.8 kg)   SpO2 95%   BMI 19.84 kg/m  GEN- NAD, alert and oriented x3 HEENT- PERRL, EOMI, non injected sclera, pink conjunctiva, MMM, oropharynx clear CVS- RRR, no murmur HR 92 RESP-CTAB EXT- No edema Pulses- Radial  2+        Assessment & Plan:      Problem List Items Addressed This Visit    Hypertension    Discussed her BP meds, advised to try 1/2 tablet of the valsartan HCTZ, goal BP 120-130 with no symptoms systolic. 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 Any transcriptional errors that result from this process are unintentional.

## 2017-05-12 NOTE — Assessment & Plan Note (Signed)
Discussed her BP meds, advised to try 1/2 tablet of the valsartan HCTZ, goal BP 120-130 with no symptoms systolic. 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

## 2017-05-12 NOTE — Patient Instructions (Signed)
Call us in 2 weeks with blood pressure readings Take 1/2 tablet of the valsartan HCTZ Keep hydrated  F/U as needed

## 2017-05-12 NOTE — Assessment & Plan Note (Signed)
Currently at baseline, no sign of decompensation or exacerbation today

## 2017-05-21 ENCOUNTER — Telehealth: Payer: Self-pay | Admitting: *Deleted

## 2017-05-21 NOTE — Telephone Encounter (Signed)
Not causing cough.  Valsartan will not do that.

## 2017-05-21 NOTE — Telephone Encounter (Signed)
Received call from patient.   Reports that she has had dry hacky cough since last visit. Reports that she thinks Valsartan HCT is causing cough.  MD please advise.

## 2017-05-21 NOTE — Telephone Encounter (Signed)
Call placed to patient. No answer. No VM.  

## 2017-05-22 MED ORDER — AMLODIPINE BESYLATE 5 MG PO TABS: 5.0000 mg | ORAL_TABLET | Freq: Every day | ORAL | 3 refills | Status: DC

## 2017-05-22 MED ORDER — HYDROCHLOROTHIAZIDE 25 MG PO TABS: 25.0000 mg | ORAL_TABLET | Freq: Every day | ORAL | 3 refills | Status: DC

## 2017-05-22 NOTE — Telephone Encounter (Signed)
Call placed to patient and patient made aware.   Patient states that she has been off the Valsartan x3 days and cough has improved. States that she knows that the Valsartan was causing her cough and she is not going to take it.   Requested Dr. Buelah Manis to advise.

## 2017-05-22 NOTE — Telephone Encounter (Signed)
She will have to take 2 BP meds then HCTZ 25mg  daily Norvasc 5mg  daily  Schedule F/U Dr. Dennard Schaumann in 4 weeks for BP recheck

## 2017-05-22 NOTE — Telephone Encounter (Signed)
Call placed to patient and patient made aware per VM per DPR.

## 2017-05-28 ENCOUNTER — Other Ambulatory Visit: Payer: Self-pay | Admitting: Family Medicine

## 2017-05-28 NOTE — Telephone Encounter (Signed)
ok 

## 2017-05-28 NOTE — Telephone Encounter (Signed)
Medication called to pharmacy. 

## 2017-05-28 NOTE — Telephone Encounter (Signed)
Ok to refill Ambien.  

## 2017-06-19 ENCOUNTER — Other Ambulatory Visit: Payer: Self-pay | Admitting: Family Medicine

## 2017-06-23 ENCOUNTER — Ambulatory Visit (INDEPENDENT_AMBULATORY_CARE_PROVIDER_SITE_OTHER): Payer: Medicare Other | Admitting: Family Medicine

## 2017-06-23 ENCOUNTER — Encounter: Payer: Self-pay | Admitting: Family Medicine

## 2017-06-23 VITALS — BP 130/64 | HR 100 | Temp 98.3°F | Resp 22 | Ht 63.0 in | Wt 110.0 lb

## 2017-06-23 DIAGNOSIS — J441 Chronic obstructive pulmonary disease with (acute) exacerbation: Secondary | ICD-10-CM | POA: Diagnosis not present

## 2017-06-23 DIAGNOSIS — Z9119 Patient's noncompliance with other medical treatment and regimen: Secondary | ICD-10-CM | POA: Diagnosis not present

## 2017-06-23 DIAGNOSIS — I10 Essential (primary) hypertension: Secondary | ICD-10-CM | POA: Diagnosis not present

## 2017-06-23 DIAGNOSIS — Z91199 Patient's noncompliance with other medical treatment and regimen due to unspecified reason: Secondary | ICD-10-CM

## 2017-06-23 MED ORDER — METHYLPREDNISOLONE ACETATE 40 MG/ML IJ SUSP
60.0000 mg | Freq: Once | INTRAMUSCULAR | Status: AC
  Administered 2017-06-23: 60 mg via INTRAMUSCULAR

## 2017-06-23 MED ORDER — PREDNISONE 20 MG PO TABS: 40.0000 mg | ORAL_TABLET | Freq: Every day | ORAL | 0 refills | Status: DC

## 2017-06-23 MED ORDER — LEVOFLOXACIN 500 MG PO TABS: 500.0000 mg | ORAL_TABLET | Freq: Every day | ORAL | 0 refills | Status: DC

## 2017-06-23 NOTE — Progress Notes (Signed)
Subjective:    Patient ID: [name], female    DOB: May 26, 1941, 76 y.o.   MRN: [id number]  HPI  11/17/16 Please see the patient's last office visit. Her blood pressure was elevated at her physical exam and I suggested that we start her on valsartan 160 mg by mouth daily and then recheck her blood pressure in a month. She is here today for follow-up.  Her blood pressure today is no better. She has been checking her blood pressure at home with her own cuff and finding her blood pressure running between 253 and 664 systolic. She denies any side effects on the medication and she is not missing any doses of the medication. She is not exercising but she is also not eating a lot of salt.  At that time, my plan was: Discontinue Diovan and replace with Diovan HCTZ 160/25 one by mouth daily. Recheck blood pressure in 3 weeks. Recheck renal function today  06/23/17 I have not seen the patient's sense. She saw my partner for COPD exacerbation. She blamed part of her cough on valsartan and wanted to discontinue the medication. She also unilaterally started taking her husband's amlodipine and "felt better."  This is very common for this patient. She attributes symptoms that she has to medication that she takes and then stops the medication. Therefore she was started on amlodipine 5 mg a day and hydrochlorothiazide 25 mg a day for her blood pressure.  Fortunately, this seems to be working.  However now she wants to discontinue hydrochlorothiazide because she believes the medicine makes her urinate too much. However with this discussion on the back burner because she is working hard to breathe. She has an increased respiratory rate with accessory muscle use. She has markedly diminished breath sounds bilaterally with diffuse expiratory wheezing. Patient has COPD and has had 2 exacerbations in the last 6 months requiring steroids. Today will be her third. She is only been taking Advair once a day rales "she didn't  feel like she needed it". She is a very sweet lady but compliance has been a long-standing issue for her  Past Medical History:  Diagnosis Date  . BV (bacterial vaginosis)   . Chronic UTI   . COPD (chronic obstructive pulmonary disease) ([place])   . FH: cataracts   . Hyperlipidemia   . Osteoporosis    Past Surgical History:  Procedure Laterality Date  . COLONOSCOPY  04/19/2012   Procedure: COLONOSCOPY;  Surgeon: Danie Binder, MD;  Location: [facility];  Service: Endoscopy;  Laterality: N/A;  11:30 AM  . FRACTURE SURGERY     rt left knee cap   Current Outpatient Prescriptions on File Prior to Visit  Medication Sig Dispense Refill  . ADVAIR DISKUS 250-50 MCG/DOSE AEPB INHALE ONE PUFF INTO THE LUNGS TWICE DAILY. 60 each 0  . albuterol (PROAIR HFA) 108 (90 Base) MCG/ACT inhaler Inhale 2 puffs into the lungs every 6 (six) hours as needed for wheezing or shortness of breath. 1 Inhaler 6  . amLODipine (NORVASC) 5 MG tablet Take 1 tablet (5 mg total) by mouth daily. 90 tablet 3  . hydrochlorothiazide (HYDRODIURIL) 25 MG tablet Take 1 tablet (25 mg total) by mouth daily. 90 tablet 3  . Vitamin D, Ergocalciferol, (DRISDOL) 50000 units CAPS capsule TAKE 1 CAPSULE BY MOUTH ONCE WEEKLY x 6 MONTHS. 4 capsule 0  . zolpidem (AMBIEN) 10 MG tablet TAKE ONE TABLET BY MOUTH AT BEDTIME AS NEEDED. 30 tablet 0   No  current facility-administered medications on file prior to visit.     Allergies  Allergen Reactions  . Naproxen Sodium Nausea And Vomiting  . Symbicort [Budesonide-Formoterol Fumarate] Shortness Of Breath    Breathing difficulty  . Alendronate Sodium Other (See Comments)    Kidney failure  . Boniva [Ibandronate Sodium] Other (See Comments)    Kidney failure   . Risedronate Sodium   . Zetia [Ezetimibe] Other (See Comments)    Muscle pain   Social History   Social History  . Marital status: Married    Spouse name: N/A  . Number of children: N/A  . Years of education: N/A    Occupational History  . Not on file.   Social History Main Topics  . Smoking status: Former Research scientist (life sciences)  . Smokeless tobacco: Never Used  . Alcohol use No  . Drug use: No  . Sexual activity: Not on file     Comment: married, husband smokes.   Other Topics Concern  . Not on file   Social History Narrative  . No narrative on file      Review of Systems  All other systems reviewed and are negative.      Objective:   Physical Exam  Cardiovascular: Normal rate, regular rhythm and normal heart sounds.   Pulmonary/Chest: Accessory muscle usage present. No tachypnea. No respiratory distress. She has decreased breath sounds in the right upper field, the right lower field, the left upper field and the left lower field. She has wheezes. She has no rhonchi. She has rales.  Abdominal: Soft. Bowel sounds are normal.  Musculoskeletal: She exhibits no edema.  Vitals reviewed.         Assessment & Plan:  COPD exacerbation ([place])  Essential hypertension  Non-compliance  She is having a COPD exacerbation. She received 60 mg of Depo-Medrol IM 1 in the office today. She will start prednisone 40 mg a day for the next 7 days at home tomorrow. Increase albuterol to 2.5 mg nebs 4 times a day as needed. Strongly encouraged/admonished the patient to use Advair twice a day. Part of the reason I believe she is having more frequent exacerbations is due to noncompliance. I truly believe she would benefit from dual long acting bronchodilators like anoro or trelegy however she is very resistant to do this.  She agrees that she will take Advair twice a day. Add Levaquin 500 mg daily given the change in her sputum production and character.  Reassess in a week. I recommended that we make no further changes in her blood pressure medication. However, we can discuss this further when she comes back after the acute issue has resolved

## 2017-06-23 NOTE — Addendum Note (Signed)
Addended by: Shary Decamp B on: 06/23/2017 12:41 PM   Modules accepted: Orders

## 2017-06-24 DIAGNOSIS — C44311 Basal cell carcinoma of skin of nose: Secondary | ICD-10-CM | POA: Diagnosis not present

## 2017-06-30 ENCOUNTER — Ambulatory Visit (INDEPENDENT_AMBULATORY_CARE_PROVIDER_SITE_OTHER): Payer: Medicare Other | Admitting: Family Medicine

## 2017-06-30 ENCOUNTER — Encounter: Payer: Self-pay | Admitting: Family Medicine

## 2017-06-30 VITALS — BP 119/50 | HR 96 | Temp 97.8°F | Resp 20 | Ht 63.0 in | Wt 110.0 lb

## 2017-06-30 DIAGNOSIS — J441 Chronic obstructive pulmonary disease with (acute) exacerbation: Secondary | ICD-10-CM | POA: Diagnosis not present

## 2017-06-30 NOTE — Progress Notes (Signed)
Subjective:    Patient ID: [name], female    DOB: September 12, 1941, 76 y.o.   MRN: [id number]  HPI  11/17/16 Please see the patient's last office visit. Her blood pressure was elevated at her physical exam and I suggested that we start her on valsartan 160 mg by mouth daily and then recheck her blood pressure in a month. She is here today for follow-up.  Her blood pressure today is no better. She has been checking her blood pressure at home with her own cuff and finding her blood pressure running between 622 and 297 systolic. She denies any side effects on the medication and she is not missing any doses of the medication. She is not exercising but she is also not eating a lot of salt.  At that time, my plan was: Discontinue Diovan and replace with Diovan HCTZ 160/25 one by mouth daily. Recheck blood pressure in 3 weeks. Recheck renal function today  06/23/17 I have not seen the patient's sense. She saw my partner for COPD exacerbation. She blamed part of her cough on valsartan and wanted to discontinue the medication. She also unilaterally started taking her husband's amlodipine and "felt better."  This is very common for this patient. She attributes symptoms that she has to medication that she takes and then stops the medication. Therefore she was started on amlodipine 5 mg a day and hydrochlorothiazide 25 mg a day for her blood pressure.  Fortunately, this seems to be working.  However now she wants to discontinue hydrochlorothiazide because she believes the medicine makes her urinate too much. However with this discussion on the back burner because she is working hard to breathe. She has an increased respiratory rate with accessory muscle use. She has markedly diminished breath sounds bilaterally with diffuse expiratory wheezing. Patient has COPD and has had 2 exacerbations in the last 6 months requiring steroids. Today will be her third. She is only been taking Advair once a day rales "she didn't  feel like she needed it". She is a very sweet lady but compliance has been a long-standing issue for her.  At that time, my plan was: She is having a COPD exacerbation. She received 60 mg of Depo-Medrol IM 1 in the office today. She will start prednisone 40 mg a day for the next 7 days at home tomorrow. Increase albuterol to 2.5 mg nebs 4 times a day as needed. Strongly encouraged/admonished the patient to use Advair twice a day. Part of the reason I believe she is having more frequent exacerbations is due to noncompliance. I truly believe she would benefit from dual long acting bronchodilators like anoro or trelegy however she is very resistant to do this.  She agrees that she will take Advair twice a day. Add Levaquin 500 mg daily given the change in her sputum production and character.  Reassess in a week. I recommended that we make no further changes in her blood pressure medication. However, we can discuss this further when she comes back after the acute issue has resolved  06/30/17 Patient's breathing is markedly better today. She is no longer having to use albuterol. She is no longer coughing. There is no increased work of breathing. There is no sensory muscle use. She is now compliant with Advair twice a day. Her lungs are much clearer. And her blood pressure is better.  Past Medical History:  Diagnosis Date  . BV (bacterial vaginosis)   . Chronic UTI   . COPD (chronic  obstructive pulmonary disease) ([place])   . FH: cataracts   . Hyperlipidemia   . Osteoporosis    Past Surgical History:  Procedure Laterality Date  . COLONOSCOPY  04/19/2012   Procedure: COLONOSCOPY;  Surgeon: Danie Binder, MD;  Location: [facility];  Service: Endoscopy;  Laterality: N/A;  11:30 AM  . FRACTURE SURGERY     rt left knee cap   Current Outpatient Prescriptions on File Prior to Visit  Medication Sig Dispense Refill  . ADVAIR DISKUS 250-50 MCG/DOSE AEPB INHALE ONE PUFF INTO THE LUNGS TWICE DAILY. 60 each 0    . albuterol (PROAIR HFA) 108 (90 Base) MCG/ACT inhaler Inhale 2 puffs into the lungs every 6 (six) hours as needed for wheezing or shortness of breath. 1 Inhaler 6  . amLODipine (NORVASC) 5 MG tablet Take 1 tablet (5 mg total) by mouth daily. 90 tablet 3  . hydrochlorothiazide (HYDRODIURIL) 25 MG tablet Take 1 tablet (25 mg total) by mouth daily. 90 tablet 3  . Vitamin D, Ergocalciferol, (DRISDOL) 50000 units CAPS capsule TAKE 1 CAPSULE BY MOUTH ONCE WEEKLY x 6 MONTHS. 4 capsule 0  . zolpidem (AMBIEN) 10 MG tablet TAKE ONE TABLET BY MOUTH AT BEDTIME AS NEEDED. 30 tablet 0   No current facility-administered medications on file prior to visit.     Allergies  Allergen Reactions  . Naproxen Sodium Nausea And Vomiting  . Symbicort [Budesonide-Formoterol Fumarate] Shortness Of Breath    Breathing difficulty  . Alendronate Sodium Other (See Comments)    Kidney failure  . Boniva [Ibandronate Sodium] Other (See Comments)    Kidney failure   . Risedronate Sodium   . Zetia [Ezetimibe] Other (See Comments)    Muscle pain   Social History   Social History  . Marital status: Married    Spouse name: N/A  . Number of children: N/A  . Years of education: N/A   Occupational History  . Not on file.   Social History Main Topics  . Smoking status: Former Research scientist (life sciences)  . Smokeless tobacco: Never Used  . Alcohol use No  . Drug use: No  . Sexual activity: Not on file     Comment: married, husband smokes.   Other Topics Concern  . Not on file   Social History Narrative  . No narrative on file      Review of Systems  All other systems reviewed and are negative.      Objective:   Physical Exam  Cardiovascular: Normal rate, regular rhythm and normal heart sounds.   Pulmonary/Chest: No accessory muscle usage. No tachypnea. No respiratory distress. She has decreased breath sounds in the right upper field, the right lower field, the left upper field and the left lower field. She has no  wheezes. She has no rhonchi. She has no rales.      Abdominal: Soft. Bowel sounds are normal.  Musculoskeletal: She exhibits no edema.  Vitals reviewed.         Assessment & Plan:  COPD exacerbation ([place])  Much improved. Continue Advair 250/50 one inhalation twice a day. Continue amlodipine and hydrochlorothiazide for hypertension. Consider adding Spiriva in the future if COPD exacerbations become more frequent.

## 2017-07-13 ENCOUNTER — Other Ambulatory Visit: Payer: Self-pay | Admitting: Family Medicine

## 2017-07-13 ENCOUNTER — Telehealth: Payer: Self-pay | Admitting: Family Medicine

## 2017-07-13 NOTE — Telephone Encounter (Signed)
Pt called and states that the Amlodipine and HCTZ are making her cough up phelgm really bad and she coughs consistently and would like to stop these 2 meds and take losartan and see how that does for her cough?

## 2017-07-14 NOTE — Telephone Encounter (Signed)
The medicines are not causing the cough.  She previously had problems on an ARB and losartan is an ARB.  SHe is welcome to switch BUT I feel the cough is likely due to her COPD.  Is she using her advair twice daily.  If so, I want to see her before she has another exacerbation.

## 2017-07-15 MED ORDER — LOSARTAN POTASSIUM 50 MG PO TABS: 50.0000 mg | ORAL_TABLET | Freq: Every day | ORAL | 3 refills | Status: DC

## 2017-07-15 NOTE — Telephone Encounter (Signed)
Called and spoke to pt and she has already stopped the Amlodipine and HCTZ and states that the cough and phlegm has gone away. So she started taking Losartan 50 mg qd in place of the other 2 BP meds. She is using Advair bid and will call is worsening.

## 2017-07-27 ENCOUNTER — Other Ambulatory Visit: Payer: Self-pay | Admitting: Family Medicine

## 2017-07-27 DIAGNOSIS — C44311 Basal cell carcinoma of skin of nose: Secondary | ICD-10-CM | POA: Diagnosis not present

## 2017-07-27 DIAGNOSIS — Z1231 Encounter for screening mammogram for malignant neoplasm of breast: Secondary | ICD-10-CM

## 2017-08-03 ENCOUNTER — Other Ambulatory Visit: Payer: Self-pay | Admitting: Family Medicine

## 2017-08-31 ENCOUNTER — Ambulatory Visit (HOSPITAL_COMMUNITY)
Admission: RE | Admit: 2017-08-31 | Discharge: 2017-08-31 | Disposition: A | Discharge status: Home / Self Care | Payer: Medicare Other | Source: Ambulatory Visit | Attending: Family Medicine | Admitting: Family Medicine

## 2017-08-31 DIAGNOSIS — Z1231 Encounter for screening mammogram for malignant neoplasm of breast: Secondary | ICD-10-CM | POA: Diagnosis not present

## 2017-09-03 ENCOUNTER — Other Ambulatory Visit: Payer: Self-pay | Admitting: Family Medicine

## 2017-09-14 ENCOUNTER — Other Ambulatory Visit: Payer: Self-pay | Admitting: Family Medicine

## 2017-09-14 NOTE — Telephone Encounter (Signed)
ok 

## 2017-09-14 NOTE — Telephone Encounter (Signed)
rx called into pharmacy

## 2017-09-14 NOTE — Telephone Encounter (Signed)
Last ov 8/24 Last refill 6/21 Ok to refill?

## 2017-10-02 ENCOUNTER — Other Ambulatory Visit: Payer: Self-pay | Admitting: Family Medicine

## 2017-10-06 ENCOUNTER — Other Ambulatory Visit: Payer: Self-pay | Admitting: Family Medicine

## 2017-10-12 ENCOUNTER — Ambulatory Visit: Payer: Medicare Other

## 2017-10-19 ENCOUNTER — Other Ambulatory Visit: Payer: Self-pay | Admitting: Family Medicine

## 2017-10-19 DIAGNOSIS — E43 Unspecified severe protein-calorie malnutrition: Secondary | ICD-10-CM

## 2017-10-19 DIAGNOSIS — E559 Vitamin D deficiency, unspecified: Secondary | ICD-10-CM

## 2017-10-19 DIAGNOSIS — I10 Essential (primary) hypertension: Secondary | ICD-10-CM

## 2017-10-19 DIAGNOSIS — Z79899 Other long term (current) drug therapy: Secondary | ICD-10-CM

## 2017-10-19 DIAGNOSIS — E785 Hyperlipidemia, unspecified: Secondary | ICD-10-CM

## 2017-10-19 DIAGNOSIS — M810 Age-related osteoporosis without current pathological fracture: Secondary | ICD-10-CM

## 2017-10-20 ENCOUNTER — Other Ambulatory Visit: Payer: Medicare Other

## 2017-10-20 ENCOUNTER — Ambulatory Visit (INDEPENDENT_AMBULATORY_CARE_PROVIDER_SITE_OTHER): Payer: Medicare Other

## 2017-10-20 DIAGNOSIS — Z23 Encounter for immunization: Secondary | ICD-10-CM | POA: Diagnosis not present

## 2017-10-20 DIAGNOSIS — E785 Hyperlipidemia, unspecified: Secondary | ICD-10-CM | POA: Diagnosis not present

## 2017-10-20 DIAGNOSIS — M810 Age-related osteoporosis without current pathological fracture: Secondary | ICD-10-CM

## 2017-10-20 DIAGNOSIS — E559 Vitamin D deficiency, unspecified: Secondary | ICD-10-CM | POA: Diagnosis not present

## 2017-10-20 DIAGNOSIS — I10 Essential (primary) hypertension: Secondary | ICD-10-CM

## 2017-10-20 DIAGNOSIS — E43 Unspecified severe protein-calorie malnutrition: Secondary | ICD-10-CM | POA: Diagnosis not present

## 2017-10-20 DIAGNOSIS — Z79899 Other long term (current) drug therapy: Secondary | ICD-10-CM

## 2017-10-20 NOTE — Progress Notes (Signed)
Patient was seen in office for flu vaccine.Patient received vaccine in left deltoid.patient tolerated well

## 2017-10-21 LAB — COMPLETE METABOLIC PANEL WITH GFR
AG RATIO: 1.5 (calc) (ref 1.0–2.5)
ALBUMIN MSPROF: 4.3 g/dL (ref 3.6–5.1)
ALT: 20 U/L (ref 6–29)
AST: 29 U/L (ref 10–35)
Alkaline phosphatase (APISO): 111 U/L (ref 33–130)
BILIRUBIN TOTAL: 0.7 mg/dL (ref 0.2–1.2)
BUN / CREAT RATIO: 50 (calc) — AB (ref 6–22)
BUN: 29 mg/dL — ABNORMAL HIGH (ref 7–25)
CALCIUM: 10 mg/dL (ref 8.6–10.4)
CO2: 31 mmol/L (ref 20–32)
CREATININE: 0.58 mg/dL — AB (ref 0.60–0.93)
Chloride: 99 mmol/L (ref 98–110)
GFR, EST AFRICAN AMERICAN: 104 mL/min/{1.73_m2} (ref >=60)
GFR, EST NON AFRICAN AMERICAN: 90 mL/min/{1.73_m2} (ref >=60)
Globulin: 2.9 g/dL (calc) (ref 1.9–3.7)
Glucose, Bld: 102 mg/dL — ABNORMAL HIGH (ref 65–99)
POTASSIUM: 4 mmol/L (ref 3.5–5.3)
SODIUM: 139 mmol/L (ref 135–146)
TOTAL PROTEIN: 7.2 g/dL (ref 6.1–8.1)

## 2017-10-21 LAB — CBC WITH DIFFERENTIAL/PLATELET
BASOS ABS: 127 {cells}/uL (ref 0–200)
BASOS PCT: 1.1 %
EOS PCT: 2.6 %
Eosinophils Absolute: 299 cells/uL (ref 15–500)
HEMATOCRIT: 45.3 % — AB (ref 35.0–45.0)
HEMOGLOBIN: 15.4 g/dL (ref 11.7–15.5)
LYMPHS ABS: 2392 {cells}/uL (ref 850–3900)
MCH: 31.6 pg (ref 27.0–33.0)
MCHC: 34 g/dL (ref 32.0–36.0)
MCV: 93 fL (ref 80.0–100.0)
MONOS PCT: 6.3 %
MPV: 10.4 fL (ref 7.5–12.5)
NEUTROS ABS: 7958 {cells}/uL — AB (ref 1500–7800)
Neutrophils Relative %: 69.2 %
Platelets: 515 10*3/uL — ABNORMAL HIGH (ref 140–400)
RBC: 4.87 10*6/uL (ref 3.80–5.10)
RDW: 13.1 % (ref 11.0–15.0)
Total Lymphocyte: 20.8 %
WBC mixed population: 725 cells/uL (ref 200–950)
WBC: 11.5 10*3/uL — AB (ref 3.8–10.8)

## 2017-10-21 LAB — LIPID PANEL
CHOL/HDL RATIO: 2.9 (calc) (ref <5.0)
Cholesterol: 230 mg/dL — ABNORMAL HIGH (ref <200)
HDL: 78 mg/dL (ref >50)
LDL Cholesterol (Calc): 135 mg/dL (calc) — ABNORMAL HIGH
NON-HDL CHOLESTEROL (CALC): 152 mg/dL — AB (ref <130)
Triglycerides: 75 mg/dL (ref <150)

## 2017-10-21 LAB — VITAMIN D 25 HYDROXY (VIT D DEFICIENCY, FRACTURES): Vit D, 25-Hydroxy: 62 ng/mL (ref 30–100)

## 2017-10-28 ENCOUNTER — Ambulatory Visit (INDEPENDENT_AMBULATORY_CARE_PROVIDER_SITE_OTHER): Payer: Medicare Other | Admitting: Family Medicine

## 2017-10-28 ENCOUNTER — Encounter: Payer: Self-pay | Admitting: Family Medicine

## 2017-10-28 VITALS — BP 124/60 | HR 62 | Temp 98.7°F | Resp 20 | Ht 63.0 in | Wt 109.0 lb

## 2017-10-28 DIAGNOSIS — M810 Age-related osteoporosis without current pathological fracture: Secondary | ICD-10-CM | POA: Diagnosis not present

## 2017-10-28 DIAGNOSIS — Z0000 Encounter for general adult medical examination without abnormal findings: Secondary | ICD-10-CM | POA: Diagnosis not present

## 2017-10-28 DIAGNOSIS — J418 Mixed simple and mucopurulent chronic bronchitis: Secondary | ICD-10-CM | POA: Diagnosis not present

## 2017-10-28 DIAGNOSIS — Z9119 Patient's noncompliance with other medical treatment and regimen: Secondary | ICD-10-CM | POA: Diagnosis not present

## 2017-10-28 DIAGNOSIS — Z91199 Patient's noncompliance with other medical treatment and regimen due to unspecified reason: Secondary | ICD-10-CM

## 2017-10-28 DIAGNOSIS — I10 Essential (primary) hypertension: Secondary | ICD-10-CM

## 2017-10-28 MED ORDER — FLUTICASONE-SALMETEROL 250-50 MCG/DOSE IN AEPB: INHALATION_SPRAY | RESPIRATORY_TRACT | 0 refills | Status: DC

## 2017-10-28 MED ORDER — LOSARTAN POTASSIUM 50 MG PO TABS: 50.0000 mg | ORAL_TABLET | Freq: Every day | ORAL | 3 refills | Status: DC

## 2017-10-28 MED ORDER — ZOLPIDEM TARTRATE 10 MG PO TABS: 10.0000 mg | ORAL_TABLET | Freq: Every evening | ORAL | 0 refills | Status: DC | PRN

## 2017-10-28 NOTE — Progress Notes (Signed)
Subjective:    Patient ID: [name], female    DOB: 1941-02-28, 76 y.o.   MRN: [id number]  HPI Patient is here today for complete physical exam.  Mammogram was performed in September and was normal. Her last colonoscopy was in 2013 and is not due again until 2023. Due to a she does not require a Pap smear. Her bone density is significant for osteoporosis however she is failed 3 different bisphosphonates as well as prolia and refuses to try any other medications.  I am thoroughly confused regarding her blood pressure medication.  Last year I had her on valsartan hydrochlorothiazide.  She discontinue this medication when she saw my partner this summer due to low blood pressure.  She began taking her husband's amlodipine.  We then prescribed amlodipine for her however later that summer she discontinued amlodipine and hydrochlorothiazide due to cough and polyuria.   we then placed her on losartan according to our last phone note.  She is supposed to be on 50 mg of losartan a day.  However she states that she has been taking valsartan hydrochlorothiazide 160/12.5 1/2 tablet daily over the last month which she independently started taking on her own due to high blood pressure however she wants to stop this 1 now because of polyuria.  I explained to the patient that I am confused.  I also explained to the patient that she is eventually going to hurt herself by starting and stopping numerous medications independently without consultation with a physician.  She also runs high risk of inadvertently taking duplicates of medications.  I thought she was taking losartan when she had resumed her valsartan hydrochlorothiazide which was an old prescription that she had at home from last August.  I was very clear with the patient that she must stop doing this.  She also tends to discontinue medication due to "side effects" that occur when she takes medication.  She thinks that over-the-counter vitamin D causes her to  have a cough but prescription vitamin D does not.  She felt bloated pain was causing a cough.  She feels bad taking every bisphosphonate and statin medication.  Many of the side effects are likely due to other problems or underlying medical conditions and not the medications themselves yet the patient attributes them to the medication and will frequently discontinue them independently.  I tried to explain the danger of doing this. Appointment on 10/20/2017  Component Date Value Ref Range Status  . Glucose, Bld 10/20/2017 102* 65 - 99 mg/dL Final   Comment: .            Fasting reference interval . For someone without known diabetes, a glucose value between 100 and 125 mg/dL is consistent with prediabetes and should be confirmed with a follow-up test. .   . BUN 10/20/2017 29* 7 - 25 mg/dL Final  . Creat 10/20/2017 0.58* 0.60 - 0.93 mg/dL Final   Comment: For patients >39 years of age, the reference limit for Creatinine is approximately 13% higher for people identified as African-American. .   . GFR, Est Non African American 10/20/2017 90  > OR = 60 mL/min/1.24m2 Final  . GFR, Est African American 10/20/2017 104  > OR = 60 mL/min/1.58m2 Final  . BUN/Creatinine Ratio 10/20/2017 50* 6 - 22 (calc) Final  . Sodium 10/20/2017 139  135 - 146 mmol/L Final  . Potassium 10/20/2017 4.0  3.5 - 5.3 mmol/L Final  . Chloride 10/20/2017 99  98 - 110 mmol/L  Final  . CO2 10/20/2017 31  20 - 32 mmol/L Final  . Calcium 10/20/2017 10.0  8.6 - 10.4 mg/dL Final  . Total Protein 10/20/2017 7.2  6.1 - 8.1 g/dL Final  . Albumin 10/20/2017 4.3  3.6 - 5.1 g/dL Final  . Globulin 10/20/2017 2.9  1.9 - 3.7 g/dL (calc) Final  . AG Ratio 10/20/2017 1.5  1.0 - 2.5 (calc) Final  . Total Bilirubin 10/20/2017 0.7  0.2 - 1.2 mg/dL Final  . Alkaline phosphatase (APISO) 10/20/2017 111  33 - 130 U/L Final  . AST 10/20/2017 29  10 - 35 U/L Final  . ALT 10/20/2017 20  6 - 29 U/L Final  . Cholesterol 10/20/2017 230* <200  mg/dL Final  . HDL 10/20/2017 78  >50 mg/dL Final  . Triglycerides 10/20/2017 75  <150 mg/dL Final  . LDL Cholesterol (Calc) 10/20/2017 135* mg/dL (calc) Final   Comment: Reference range: <100 . Desirable range <100 mg/dL for primary prevention;   <70 mg/dL for patients with CHD or diabetic patients  with > or = 2 CHD risk factors. Marland Kitchen LDL-C is now calculated using the Martin-Hopkins  calculation, which is a validated novel method providing  better accuracy than the Friedewald equation in the  estimation of LDL-C.  Cresenciano Genre et al. Annamaria Helling. 7124;580(99): 2061-2068  (http://education.[facility].com/faq/FAQ164)   . Total CHOL/HDL Ratio 10/20/2017 2.9  <5.0 (calc) Final  . Non-HDL Cholesterol (Calc) 10/20/2017 152* <130 mg/dL (calc) Final   Comment: For patients with diabetes plus 1 major ASCVD risk  factor, treating to a non-HDL-C goal of <100 mg/dL  (LDL-C of <70 mg/dL) is considered a therapeutic  option.   . WBC 10/20/2017 11.5* 3.8 - 10.8 Thousand/uL Final  . RBC 10/20/2017 4.87  3.80 - 5.10 Million/uL Final  . Hemoglobin 10/20/2017 15.4  11.7 - 15.5 g/dL Final  . HCT 10/20/2017 45.3* 35.0 - 45.0 % Final  . MCV 10/20/2017 93.0  80.0 - 100.0 fL Final  . MCH 10/20/2017 31.6  27.0 - 33.0 pg Final  . MCHC 10/20/2017 34.0  32.0 - 36.0 g/dL Final  . RDW 10/20/2017 13.1  11.0 - 15.0 % Final  . Platelets 10/20/2017 515* 140 - 400 Thousand/uL Final  . MPV 10/20/2017 10.4  7.5 - 12.5 fL Final  . Neutro Abs 10/20/2017 7,958* 1,500 - 7,800 cells/uL Final  . Lymphs Abs 10/20/2017 2,392  850 - 3,900 cells/uL Final  . WBC mixed population 10/20/2017 725  200 - 950 cells/uL Final  . Eosinophils Absolute 10/20/2017 299  15 - 500 cells/uL Final  . Basophils Absolute 10/20/2017 127  0 - 200 cells/uL Final  . Neutrophils Relative % 10/20/2017 69.2  % Final  . Total Lymphocyte 10/20/2017 20.8  % Final  . Monocytes Relative 10/20/2017 6.3  % Final  . Eosinophils Relative 10/20/2017 2.6  %  Final  . Basophils Relative 10/20/2017 1.1  % Final  . Vit D, 25-Hydroxy 10/20/2017 62  30 - 100 ng/mL Final   Comment: Vitamin D Status         25-OH Vitamin D: . Deficiency:                    <20 ng/mL Insufficiency:             20 - 29 ng/mL Optimal:                 > or = 30 ng/mL . For 25-OH Vitamin D testing on patients on  D2-supplementation and patients for whom quantitation  of D2 and D3 fractions is required, the [facility](TM) 25-OH VIT D, (D2,D3), LC/MS/MS is recommended: order  code 905 [id number] (patients >93yrs). . For more information on this test, go to: http://education.[facility].com/faq/FAQ163 (This link is being provided for  informational/educational purposes only.)    Past Medical History:  Diagnosis Date  . BV (bacterial vaginosis)   . Chronic UTI   . COPD (chronic obstructive pulmonary disease) ([place])   . FH: cataracts   . Hyperlipidemia   . Osteoporosis    Past Surgical History:  Procedure Laterality Date  . COLONOSCOPY  04/19/2012   Procedure: COLONOSCOPY;  Surgeon: Danie Binder, MD;  Location[facility];  Service: Endoscopy;  Laterality: N/A;  11:30 AM  . FRACTURE SURGERY     rt left knee cap   Current Outpatient Medications on File Prior to Visit  Medication Sig Dispense Refill  . albuterol (PROAIR HFA) 108 (90 Base) MCG/ACT inhaler Inhale 2 puffs into the lungs every 6 (six) hours as needed for wheezing or shortness of breath. 1 Inhaler 6  . losartan (COZAAR) 50 MG tablet Take 1 tablet (50 mg total) by mouth daily. (Patient taking differently: Take 25 mg by mouth daily. ) 90 tablet 3  . Vitamin D, Ergocalciferol, (DRISDOL) 50000 units CAPS capsule TAKE 1 CAPSULE BY MOUTH ONCE WEEKLY x 6 MONTHS. 4 capsule 0   No current facility-administered medications on file prior to visit.    Allergies  Allergen Reactions  . Naproxen Sodium Nausea And Vomiting  . Symbicort [Budesonide-Formoterol Fumarate] Shortness Of Breath    Breathing difficulty   . Alendronate Sodium Other (See Comments)    Kidney failure  . Boniva [Ibandronate Sodium] Other (See Comments)    Kidney failure   . Risedronate Sodium   . Zetia [Ezetimibe] Other (See Comments)    Muscle pain   Social History   Socioeconomic History  . Marital status: Married    Spouse name: Not on file  . Number of children: Not on file  . Years of education: Not on file  . Highest education level: Not on file  Social Needs  . Financial resource strain: Not on file  . Food insecurity - worry: Not on file  . Food insecurity - inability: Not on file  . Transportation needs - medical: Not on file  . Transportation needs - non-medical: Not on file  Occupational History  . Not on file  Tobacco Use  . Smoking status: Former Research scientist (life sciences)  . Smokeless tobacco: Never Used  Substance and Sexual Activity  . Alcohol use: No  . Drug use: No  . Sexual activity: Not on file    Comment: married, husband smokes.  Other Topics Concern  . Not on file  Social History Narrative  . Not on file   Family History  Problem Relation Age of Onset  . Cancer Father        pancreatic      Review of Systems  All other systems reviewed and are negative.      Objective:   Physical Exam  Constitutional: She is oriented to person, place, and time. She appears well-developed and well-nourished. No distress.  HENT:  Head: Normocephalic and atraumatic.  Right Ear: External ear normal.  Left Ear: External ear normal.  Nose: Nose normal.  Mouth/Throat: Oropharynx is clear and moist. No oropharyngeal exudate.  Eyes: Conjunctivae and EOM are normal. Pupils are equal, round, and reactive to light. Right eye exhibits no  discharge. Left eye exhibits no discharge. No scleral icterus.  Neck: Normal range of motion. Neck supple. No JVD present. No tracheal deviation present. No thyromegaly present.  Cardiovascular: Normal rate, regular rhythm, normal heart sounds and intact distal pulses. Exam reveals no  gallop and no friction rub.  No murmur heard. Pulmonary/Chest: No accessory muscle usage or stridor. No respiratory distress. She has no decreased breath sounds. She has no wheezes. She has no rhonchi. She has no rales.  Abdominal: Soft. Bowel sounds are normal. She exhibits no distension and no mass. There is no tenderness. There is no rebound and no guarding.  Musculoskeletal: Normal range of motion. She exhibits no edema or tenderness.  Lymphadenopathy:    She has no cervical adenopathy.  Neurological: She is alert and oriented to person, place, and time. She has normal reflexes. No cranial nerve deficit. She exhibits normal muscle tone. Coordination normal.  Skin: Skin is warm. No rash noted. She is not diaphoretic. No erythema. No pallor.  Psychiatric: She has a normal mood and affect. Her behavior is normal. Judgment and thought content normal.  Vitals reviewed.         Assessment & Plan:  Routine general medical examination at a health care facility  Mixed simple and mucopurulent chronic bronchitis (HCC)  Non-compliance  Essential hypertension  Postmenopausal osteoporosis - Plan: DG Bone Density   Immunization History  Administered Date(s) Administered  . Influenza Whole 08/30/2010  . Influenza, High Dose Seasonal PF 10/20/2017  . Influenza,inj,Quad PF,6+ Mos 09/12/2013, 10/12/2014, 10/09/2015, 10/20/2016  . Pneumococcal Conjugate-13 04/03/2014  . Pneumococcal Polysaccharide-23 04/29/2004, 03/26/2011  . Td 04/29/2004   I will schedule the patient for a bone density to monitor her osteoporosis.  Her mammogram is up-to-date.  She does not require a Pap smear.  She does not require colonoscopy.  Immunizations are up-to-date except the shingles vaccine which she refuses due to cost.  I was adamant with the patient that she must stop switching medication particularly doing it on her own without consultation.  She is inadvertently going to hurt herself.  We will start the  patient today on losartan 50 mg a day and discontinue any other antihypertensive that she is taking.  I personally threw away the Diovan HCTZ that she has been taking the last 3 weeks.  I want to recheck her blood pressure in 1 month to determine what if any changes need to be made.  Overall the remainder of her lab work looks excellent.  Reassess blood pressure in 1 month.

## 2017-10-30 ENCOUNTER — Encounter: Payer: Medicare Other | Admitting: Family Medicine

## 2017-11-04 ENCOUNTER — Encounter: Payer: Self-pay | Admitting: Family Medicine

## 2017-11-16 ENCOUNTER — Inpatient Hospital Stay (HOSPITAL_COMMUNITY)
Admission: RE | Admit: 2017-11-16 | Discharge: 2017-11-16 | Disposition: A | Discharge status: Home / Self Care | Payer: Medicare Other | Source: Ambulatory Visit | Attending: Family Medicine | Admitting: Family Medicine

## 2017-11-24 ENCOUNTER — Ambulatory Visit (HOSPITAL_COMMUNITY)
Admission: RE | Admit: 2017-11-24 | Discharge: 2017-11-24 | Disposition: A | Discharge status: Home / Self Care | Payer: Medicare Other | Source: Ambulatory Visit | Attending: Family Medicine | Admitting: Family Medicine

## 2017-11-24 DIAGNOSIS — Z780 Asymptomatic menopausal state: Secondary | ICD-10-CM | POA: Diagnosis not present

## 2017-11-24 DIAGNOSIS — M810 Age-related osteoporosis without current pathological fracture: Secondary | ICD-10-CM | POA: Diagnosis not present

## 2017-11-25 ENCOUNTER — Encounter: Payer: Self-pay | Admitting: Family Medicine

## 2017-12-14 ENCOUNTER — Telehealth: Payer: Self-pay | Admitting: Family Medicine

## 2017-12-14 NOTE — Telephone Encounter (Signed)
Pt called [facility] stating that the Losartan makes her go to the bathroom too much and would like it changed if possible.

## 2017-12-14 NOTE — Telephone Encounter (Signed)
Losartan does not cause excess urination, please continue medication.  If she is voiding too much, may be something else is wrong.

## 2017-12-15 NOTE — Telephone Encounter (Signed)
Patient aware of providers recommendations and states that she is having some mild pain down there but she does not think she has a UTI. She did make an apt for Friday.

## 2017-12-18 ENCOUNTER — Encounter: Payer: Self-pay | Admitting: Family Medicine

## 2017-12-18 ENCOUNTER — Ambulatory Visit (INDEPENDENT_AMBULATORY_CARE_PROVIDER_SITE_OTHER): Payer: Medicare Other | Admitting: Family Medicine

## 2017-12-18 VITALS — BP 180/70 | HR 84 | Temp 98.1°F | Resp 20 | Ht 63.0 in | Wt 109.0 lb

## 2017-12-18 DIAGNOSIS — I10 Essential (primary) hypertension: Secondary | ICD-10-CM | POA: Diagnosis not present

## 2017-12-18 MED ORDER — AMLODIPINE BESYLATE 10 MG PO TABS: 10.0000 mg | ORAL_TABLET | Freq: Every day | ORAL | 3 refills | Status: DC

## 2017-12-18 NOTE — Progress Notes (Signed)
Subjective:    Patient ID: [name], female    DOB: July 26, 1941, 77 y.o.   MRN: [id number]  HPI  10/2017 Patient is here today for complete physical exam.  Mammogram was performed in September and was normal. Her last colonoscopy was in 2013 and is not due again until 2023. Due to a she does not require a Pap smear. Her bone density is significant for osteoporosis however she is failed 3 different bisphosphonates as well as prolia and refuses to try any other medications.  I am thoroughly confused regarding her blood pressure medication.  Last year I had her on valsartan hydrochlorothiazide.  She discontinue this medication when she saw my partner this summer due to low blood pressure.  She began taking her husband's amlodipine.  We then prescribed amlodipine for her however later that summer she discontinued amlodipine and hydrochlorothiazide due to cough and polyuria.   we then placed her on losartan according to our last phone note.  She is supposed to be on 50 mg of losartan a day.  However she states that she has been taking valsartan hydrochlorothiazide 160/12.5 1/2 tablet daily over the last month which she independently started taking on her own due to high blood pressure however she wants to stop this 1 now because of polyuria.  I explained to the patient that I am confused.  I also explained to the patient that she is eventually going to hurt herself by starting and stopping numerous medications independently without consultation with a physician.  She also runs high risk of inadvertently taking duplicates of medications.  I thought she was taking losartan when she had resumed her valsartan hydrochlorothiazide which was an old prescription that she had at home from last August.  I was very clear with the patient that she must stop doing this.  She also tends to discontinue medication due to "side effects" that occur when she takes medication.  She thinks that over-the-counter vitamin D causes  her to have a cough but prescription vitamin D does not.  She felt bloated pain was causing a cough.  She feels bad taking every bisphosphonate and statin medication.  Many of the side effects are likely due to other problems or underlying medical conditions and not the medications themselves yet the patient attributes them to the medication and will frequently discontinue them independently.  I tried to explain the danger of doing this.  At that time, my plan was: I will schedule the patient for a bone density to monitor her osteoporosis.  Her mammogram is up-to-date.  She does not require a Pap smear.  She does not require colonoscopy.  Immunizations are up-to-date except the shingles vaccine which she refuses due to cost.  I was adamant with the patient that she must stop switching medication particularly doing it on her own without consultation.  She is inadvertently going to hurt herself.  We will start the patient today on losartan 50 mg a day and discontinue any other antihypertensive that she is taking.  I personally threw away the Diovan HCTZ that she has been taking the last 3 weeks.  I want to recheck her blood pressure in 1 month to determine what if any changes need to be made.  Overall the remainder of her lab work looks excellent.  Reassess blood pressure in 1 month.  12/18/17 The patient stopped her losartan due to polyuria.  I explained to the patient that I did not believe losartan is causing her  polyuria however she discontinued the medication independently anyway. As a result her blood pressure is 180/70 however the polyuria that she reported has subsided. She wants to switch to amlodipine because her husband takes that and it doesn't cause him any problems. I explained to the patient that she is tried that in the past and she discontinued it because she thought that it was causing her to have a cough.  Past Medical History:  Diagnosis Date  . BV (bacterial vaginosis)   . Chronic UTI   .  COPD (chronic obstructive pulmonary disease) ([place]   . FH: cataracts   . Hyperlipidemia   . Osteoporosis    Past Surgical History:  Procedure Laterality Date  . COLONOSCOPY  04/19/2012   Procedure: COLONOSCOPY;  Surgeon: Danie Binder, MD;  Location[facility];  Service: Endoscopy;  Laterality: N/A;  11:30 AM  . FRACTURE SURGERY     rt left knee cap   Current Outpatient Medications on File Prior to Visit  Medication Sig Dispense Refill  . albuterol (PROAIR HFA) 108 (90 Base) MCG/ACT inhaler Inhale 2 puffs into the lungs every 6 (six) hours as needed for wheezing or shortness of breath. 1 Inhaler 6  . Fluticasone-Salmeterol (ADVAIR DISKUS) 250-50 MCG/DOSE AEPB INHALE ONE PUFF INTO THE LUNGS TWICE DAILY. 60 each 0  . losartan (COZAAR) 50 MG tablet Take 1 tablet (50 mg total) by mouth daily. Discontinue every other blood pressure medicine.  Cancel any other refills (valsartan hctz, amlodipine, etc)  This is the only blood pressure pill you should be taking. 90 tablet 3  . Vitamin D, Ergocalciferol, (DRISDOL) 50000 units CAPS capsule TAKE 1 CAPSULE BY MOUTH ONCE WEEKLY x 6 MONTHS. 4 capsule 0  . zolpidem (AMBIEN) 10 MG tablet Take 1 tablet (10 mg total) by mouth at bedtime as needed. 30 tablet 0  . losartan (COZAAR) 50 MG tablet Take 1 tablet (50 mg total) by mouth daily. (Patient not taking: Reported on 12/18/2017) 90 tablet 3   No current facility-administered medications on file prior to visit.    Allergies  Allergen Reactions  . Naproxen Sodium Nausea And Vomiting  . Symbicort [Budesonide-Formoterol Fumarate] Shortness Of Breath    Breathing difficulty  . Alendronate Sodium Other (See Comments)    Kidney failure  . Boniva [Ibandronate Sodium] Other (See Comments)    Kidney failure   . Risedronate Sodium   . Zetia [Ezetimibe] Other (See Comments)    Muscle pain   Social History   Socioeconomic History  . Marital status: Married    Spouse name: Not on file  . Number of  children: Not on file  . Years of education: Not on file  . Highest education level: Not on file  Social Needs  . Financial resource strain: Not on file  . Food insecurity - worry: Not on file  . Food insecurity - inability: Not on file  . Transportation needs - medical: Not on file  . Transportation needs - non-medical: Not on file  Occupational History  . Not on file  Tobacco Use  . Smoking status: Former Research scientist (life sciences)  . Smokeless tobacco: Never Used  Substance and Sexual Activity  . Alcohol use: No  . Drug use: No  . Sexual activity: Not on file    Comment: married, husband smokes.  Other Topics Concern  . Not on file  Social History Narrative  . Not on file   Family History  Problem Relation Age of Onset  . Cancer  Father        pancreatic      Review of Systems  All other systems reviewed and are negative.      Objective:   Physical Exam  Constitutional: She is oriented to person, place, and time. She appears well-developed and well-nourished. No distress.  HENT:  Head: Normocephalic and atraumatic.  Right Ear: External ear normal.  Left Ear: External ear normal.  Nose: Nose normal.  Mouth/Throat: Oropharynx is clear and moist. No oropharyngeal exudate.  Eyes: Conjunctivae and EOM are normal. Pupils are equal, round, and reactive to light. Right eye exhibits no discharge. Left eye exhibits no discharge. No scleral icterus.  Neck: Normal range of motion. Neck supple. No JVD present. No tracheal deviation present. No thyromegaly present.  Cardiovascular: Normal rate, regular rhythm, normal heart sounds and intact distal pulses. Exam reveals no gallop and no friction rub.  No murmur heard. Pulmonary/Chest: No accessory muscle usage or stridor. No respiratory distress. She has no decreased breath sounds. She has no wheezes. She has no rhonchi. She has no rales.  Abdominal: Soft. Bowel sounds are normal. She exhibits no distension and no mass. There is no tenderness. There  is no rebound and no guarding.  Musculoskeletal: Normal range of motion. She exhibits no edema or tenderness.  Lymphadenopathy:    She has no cervical adenopathy.  Neurological: She is alert and oriented to person, place, and time. She has normal reflexes. No cranial nerve deficit. She exhibits normal muscle tone. Coordination normal.  Skin: Skin is warm. No rash noted. She is not diaphoretic. No erythema. No pallor.  Psychiatric: She has a normal mood and affect. Her behavior is normal. Judgment and thought content normal.  Vitals reviewed.         Assessment & Plan:   Essential HTN  Patient is noncompliant with medication. She often attributes any symptoms she has the medicine she is taking. She continues to discontinue blood pressure pills. As result her blood pressure today is uncontrolled. I will switch the patient back to amlodipine 10 mg a day and recheck her blood pressure in 3 weeks. I'm concerned that she will likely discontinue this medication in the future and I cautioned her against this.

## 2017-12-24 ENCOUNTER — Other Ambulatory Visit: Payer: Self-pay | Admitting: Family Medicine

## 2017-12-25 ENCOUNTER — Other Ambulatory Visit: Payer: Self-pay | Admitting: Family Medicine

## 2017-12-25 NOTE — Telephone Encounter (Signed)
Ok to refill??  Last office visit/ refill 10/28/2017.

## 2018-01-25 ENCOUNTER — Other Ambulatory Visit: Payer: Self-pay | Admitting: Family Medicine

## 2018-01-26 ENCOUNTER — Encounter: Payer: Self-pay | Admitting: Family Medicine

## 2018-01-26 ENCOUNTER — Ambulatory Visit (INDEPENDENT_AMBULATORY_CARE_PROVIDER_SITE_OTHER): Payer: Medicare Other | Admitting: Family Medicine

## 2018-01-26 VITALS — BP 118/58 | HR 104 | Temp 97.7°F | Resp 18 | Ht 63.0 in | Wt 109.0 lb

## 2018-01-26 DIAGNOSIS — S0501XA Injury of conjunctiva and corneal abrasion without foreign body, right eye, initial encounter: Secondary | ICD-10-CM

## 2018-01-26 MED ORDER — POLYMYXIN B-TRIMETHOPRIM 10000-0.1 UNIT/ML-% OP SOLN: 2.0000 [drp] | OPHTHALMIC | 0 refills | Status: DC

## 2018-01-26 NOTE — Progress Notes (Signed)
Subjective:    Patient ID: [name], female    DOB: 1941-05-25, 77 y.o.   MRN: [id number]  HPI  Patient's right eye is red and irritated.  He has been that way for 2 days.  She reports a foreign body sensation in the eye.  She denies any eye pain.  She denies any blurry vision.  On examination today, the right conjunctiva is injected with increased vascularity.  Pupils are equal round reactive to light.  Fluorescein exam was performed.  There is a visible corneal defect at approximately 6:00 from the pupil.  It is very small approximately 1 mm in diameter.  It appears to be a corneal abrasion.  It is elliptical in shape Past Medical History:  Diagnosis Date  . BV (bacterial vaginosis)   . Chronic UTI   . COPD (chronic obstructive pulmonary disease) ([place])   . FH: cataracts   . Hyperlipidemia   . Osteoporosis    Past Surgical History:  Procedure Laterality Date  . COLONOSCOPY  04/19/2012   Procedure: COLONOSCOPY;  Surgeon: Danie Binder, MD;  Location: [facility];  Service: Endoscopy;  Laterality: N/A;  11:30 AM  . FRACTURE SURGERY     rt left knee cap   Current Outpatient Medications on File Prior to Visit  Medication Sig Dispense Refill  . amLODipine (NORVASC) 10 MG tablet Take 1 tablet (10 mg total) by mouth daily. 90 tablet 3  . Calcium Carbonate-Vit D-Min (CALCIUM 1200 PO) Take by mouth.    . Cholecalciferol (VITAMIN D) 2000 units CAPS Take by mouth.    . Fluticasone-Salmeterol (ADVAIR DISKUS) 250-50 MCG/DOSE AEPB INHALE ONE PUFF INTO THE LUNGS TWICE DAILY. 60 each 3  . PROAIR HFA 108 (90 Base) MCG/ACT inhaler INHALE 2 PUFFS INTO THE LUNGS EVERY 6 HORUS AS NEEDED FOR WHEEZING/SHORTNESS OF BREATH. 8.5 g 3  . zolpidem (AMBIEN) 10 MG tablet TAKE ONE TABLET BY MOUTH AT BEDTIME AS NEEDED. 30 tablet 0   No current facility-administered medications on file prior to visit.    Allergies  Allergen Reactions  . Naproxen Sodium Nausea And Vomiting  . Symbicort  [Budesonide-Formoterol Fumarate] Shortness Of Breath    Breathing difficulty  . Alendronate Sodium Other (See Comments)    Kidney failure  . Boniva [Ibandronate Sodium] Other (See Comments)    Kidney failure   . Risedronate Sodium   . Zetia [Ezetimibe] Other (See Comments)    Muscle pain   Social History   Socioeconomic History  . Marital status: Married    Spouse name: Not on file  . Number of children: Not on file  . Years of education: Not on file  . Highest education level: Not on file  Social Needs  . Financial resource strain: Not on file  . Food insecurity - worry: Not on file  . Food insecurity - inability: Not on file  . Transportation needs - medical: Not on file  . Transportation needs - non-medical: Not on file  Occupational History  . Not on file  Tobacco Use  . Smoking status: Former Research scientist (life sciences)  . Smokeless tobacco: Never Used  Substance and Sexual Activity  . Alcohol use: No  . Drug use: No  . Sexual activity: Not on file    Comment: married, husband smokes.  Other Topics Concern  . Not on file  Social History Narrative  . Not on file     Review of Systems  All other systems reviewed and are negative.  Objective:   Physical Exam  Constitutional: She appears well-developed and well-nourished.  Eyes: Lids are normal. Lids are everted and swept, no foreign bodies found. Right eye exhibits no discharge. Left eye exhibits no discharge. Right conjunctiva is injected. Left conjunctiva is not injected.    Neck: Neck supple.  Cardiovascular: Normal rate, regular rhythm and normal heart sounds.  Pulmonary/Chest: Effort normal and breath sounds normal.  Vitals reviewed.         Assessment & Plan:  Patient has a right corneal abrasion.  I have recommended Polytrim drops, 2 drops every 2 hours for the next 48 hours followed by 2 drops every 4 hours for the next 5 days.  Recheck here in 1 week to ensure improvement.  Recheck sooner if worsening or if  blurry vision develops.

## 2018-01-29 ENCOUNTER — Telehealth: Payer: Self-pay | Admitting: Family Medicine

## 2018-01-29 NOTE — Telephone Encounter (Signed)
Spoke to pt and her eye is not getting any worse and she is not having any blurred vision it's just not getting better. Pt will continue eye drops and apt made for Monday.

## 2018-01-29 NOTE — Telephone Encounter (Signed)
"  Patient has a right corneal abrasion.  I have recommended Polytrim drops, 2 drops every 2 hours for the next 48 hours followed by 2 drops every 4 hours for the next 5 days.  Recheck here in 1 week to ensure improvement.  Recheck sooner if worsening or if blurry vision develops."  She needs to be seen, abrasion could be spreading.

## 2018-01-29 NOTE — Telephone Encounter (Signed)
Pt called and states that the eye drops prescribed at her LOV is not helping at all and would like something else sent in if possible??

## 2018-02-01 ENCOUNTER — Ambulatory Visit (INDEPENDENT_AMBULATORY_CARE_PROVIDER_SITE_OTHER): Payer: Medicare Other | Admitting: Family Medicine

## 2018-02-01 ENCOUNTER — Encounter: Payer: Self-pay | Admitting: Family Medicine

## 2018-02-01 VITALS — BP 110/60 | HR 98 | Temp 97.7°F | Resp 18 | Ht 63.0 in | Wt 110.0 lb

## 2018-02-01 DIAGNOSIS — S0501XA Injury of conjunctiva and corneal abrasion without foreign body, right eye, initial encounter: Secondary | ICD-10-CM

## 2018-02-01 MED ORDER — TOBRAMYCIN-DEXAMETHASONE 0.3-0.1 % OP SUSP: 2.0000 [drp] | OPHTHALMIC | 0 refills | Status: DC

## 2018-02-01 NOTE — Progress Notes (Signed)
Subjective:    Patient ID: [name], female    DOB: 1941-03-25, 77 y.o.   MRN: [id number]  HPI 01/26/18 Patient's right eye is red and irritated.  He has been that way for 2 days.  She reports a foreign body sensation in the eye.  She denies any eye pain.  She denies any blurry vision.  On examination today, the right conjunctiva is injected with increased vascularity.  Pupils are equal round reactive to light.  Fluorescein exam was performed.  There is a visible corneal defect at approximately 6:00 from the pupil.  It is very small approximately 1 mm in diameter.  It appears to be a corneal abrasion.  It is elliptical in shape.  At that time, my plan was: Patient has a right corneal abrasion.  I have recommended Polytrim drops, 2 drops every 2 hours for the next 48 hours followed by 2 drops every 4 hours for the next 5 days.  Recheck here in 1 week to ensure improvement.  Recheck sooner if worsening or if blurry vision develops.  02/01/18 Patient denies blurry vision or eye pain.  Conjunctiva is still injected and erythematous but better than last week. Persistant corneal defect at 6  O'clock on flurosien exam.   Past Medical History:  Diagnosis Date  . BV (bacterial vaginosis)   . Chronic UTI   . COPD (chronic obstructive pulmonary disease) ([place])   . FH: cataracts   . Hyperlipidemia   . Osteoporosis    Past Surgical History:  Procedure Laterality Date  . COLONOSCOPY  04/19/2012   Procedure: COLONOSCOPY;  Surgeon: Danie Binder, MD;  Location: [facility];  Service: Endoscopy;  Laterality: N/A;  11:30 AM  . FRACTURE SURGERY     rt left knee cap   Current Outpatient Medications on File Prior to Visit  Medication Sig Dispense Refill  . amLODipine (NORVASC) 10 MG tablet Take 1 tablet (10 mg total) by mouth daily. 90 tablet 3  . Calcium Carbonate-Vit D-Min (CALCIUM 1200 PO) Take by mouth.    . Cholecalciferol (VITAMIN D) 2000 units CAPS Take by mouth.    . Fluticasone-Salmeterol  (ADVAIR DISKUS) 250-50 MCG/DOSE AEPB INHALE ONE PUFF INTO THE LUNGS TWICE DAILY. 60 each 3  . PROAIR HFA 108 (90 Base) MCG/ACT inhaler INHALE 2 PUFFS INTO THE LUNGS EVERY 6 HORUS AS NEEDED FOR WHEEZING/SHORTNESS OF BREATH. 8.5 g 3  . trimethoprim-polymyxin b (POLYTRIM) ophthalmic solution Place 2 drops into the right eye every 4 (four) hours. 2 gtts every 2 hours for 2 days then 2 gtt every four hours for 5 days. 10 mL 0  . zolpidem (AMBIEN) 10 MG tablet TAKE ONE TABLET BY MOUTH AT BEDTIME AS NEEDED. 30 tablet 0   No current facility-administered medications on file prior to visit.    Allergies  Allergen Reactions  . Naproxen Sodium Nausea And Vomiting  . Symbicort [Budesonide-Formoterol Fumarate] Shortness Of Breath    Breathing difficulty  . Alendronate Sodium Other (See Comments)    Kidney failure  . Boniva [Ibandronate Sodium] Other (See Comments)    Kidney failure   . Risedronate Sodium   . Zetia [Ezetimibe] Other (See Comments)    Muscle pain   Social History   Socioeconomic History  . Marital status: Married    Spouse name: Not on file  . Number of children: Not on file  . Years of education: Not on file  . Highest education level: Not on file  Social Needs  .  Financial resource strain: Not on file  . Food insecurity - worry: Not on file  . Food insecurity - inability: Not on file  . Transportation needs - medical: Not on file  . Transportation needs - non-medical: Not on file  Occupational History  . Not on file  Tobacco Use  . Smoking status: Former Research scientist (life sciences)  . Smokeless tobacco: Never Used  Substance and Sexual Activity  . Alcohol use: No  . Drug use: No  . Sexual activity: Not on file    Comment: married, husband smokes.  Other Topics Concern  . Not on file  Social History Narrative  . Not on file     Review of Systems  All other systems reviewed and are negative.      Objective:   Physical Exam  Constitutional: She appears well-developed and  well-nourished.  Eyes: Lids are normal. Lids are everted and swept, no foreign bodies found. Right eye exhibits no discharge. Left eye exhibits no discharge. Right conjunctiva is injected. Left conjunctiva is not injected.    Neck: Neck supple.  Cardiovascular: Normal rate, regular rhythm and normal heart sounds.  Pulmonary/Chest: Effort normal and breath sounds normal.  Vitals reviewed.         Assessment & Plan:  Abrasion of right cornea, initial encounter Switch to tobradex 2 gtts q 4hrs for 1 week.  Recheck here in 48 hrs and if no better consult opthalmology.

## 2018-02-02 ENCOUNTER — Ambulatory Visit: Payer: Medicare Other | Admitting: Family Medicine

## 2018-02-18 ENCOUNTER — Other Ambulatory Visit: Payer: Self-pay | Admitting: Family Medicine

## 2018-02-18 NOTE — Telephone Encounter (Signed)
Requesting refill    Ambien  LOV: 02/01/18  LRF:  12/25/17

## 2018-04-21 DIAGNOSIS — X32XXXD Exposure to sunlight, subsequent encounter: Secondary | ICD-10-CM | POA: Diagnosis not present

## 2018-04-21 DIAGNOSIS — Z08 Encounter for follow-up examination after completed treatment for malignant neoplasm: Secondary | ICD-10-CM | POA: Diagnosis not present

## 2018-04-21 DIAGNOSIS — L821 Other seborrheic keratosis: Secondary | ICD-10-CM | POA: Diagnosis not present

## 2018-04-21 DIAGNOSIS — L570 Actinic keratosis: Secondary | ICD-10-CM | POA: Diagnosis not present

## 2018-04-21 DIAGNOSIS — Z85828 Personal history of other malignant neoplasm of skin: Secondary | ICD-10-CM | POA: Diagnosis not present

## 2018-04-27 ENCOUNTER — Other Ambulatory Visit: Payer: Self-pay

## 2018-04-27 ENCOUNTER — Encounter: Payer: Self-pay | Admitting: Family Medicine

## 2018-04-27 ENCOUNTER — Ambulatory Visit (INDEPENDENT_AMBULATORY_CARE_PROVIDER_SITE_OTHER): Payer: Medicare Other | Admitting: Family Medicine

## 2018-04-27 VITALS — BP 124/62 | HR 97 | Temp 97.6°F | Ht 63.0 in | Wt 108.0 lb

## 2018-04-27 DIAGNOSIS — E43 Unspecified severe protein-calorie malnutrition: Secondary | ICD-10-CM

## 2018-04-27 DIAGNOSIS — I10 Essential (primary) hypertension: Secondary | ICD-10-CM

## 2018-04-27 LAB — CBC WITH DIFFERENTIAL/PLATELET
BASOS ABS: 142 {cells}/uL (ref 0–200)
Basophils Relative: 1.2 %
EOS PCT: 2.1 %
Eosinophils Absolute: 248 cells/uL (ref 15–500)
HCT: 44.6 % (ref 35.0–45.0)
Hemoglobin: 15.6 g/dL — ABNORMAL HIGH (ref 11.7–15.5)
Lymphs Abs: 2136 cells/uL (ref 850–3900)
MCH: 32.1 pg (ref 27.0–33.0)
MCHC: 35 g/dL (ref 32.0–36.0)
MCV: 91.8 fL (ref 80.0–100.0)
MONOS PCT: 8.1 %
MPV: 10.4 fL (ref 7.5–12.5)
NEUTROS PCT: 70.5 %
Neutro Abs: 8319 cells/uL — ABNORMAL HIGH (ref 1500–7800)
Platelets: 459 10*3/uL — ABNORMAL HIGH (ref 140–400)
RBC: 4.86 10*6/uL (ref 3.80–5.10)
RDW: 13.3 % (ref 11.0–15.0)
Total Lymphocyte: 18.1 %
WBC mixed population: 956 cells/uL — ABNORMAL HIGH (ref 200–950)
WBC: 11.8 10*3/uL — ABNORMAL HIGH (ref 3.8–10.8)

## 2018-04-27 LAB — COMPLETE METABOLIC PANEL WITH GFR
AG Ratio: 1.2 (calc) (ref 1.0–2.5)
ALKALINE PHOSPHATASE (APISO): 163 U/L — AB (ref 33–130)
ALT: 22 U/L (ref 6–29)
AST: 33 U/L (ref 10–35)
Albumin: 4.2 g/dL (ref 3.6–5.1)
BILIRUBIN TOTAL: 0.6 mg/dL (ref 0.2–1.2)
BUN/Creatinine Ratio: 31 (calc) — ABNORMAL HIGH (ref 6–22)
BUN: 17 mg/dL (ref 7–25)
CHLORIDE: 103 mmol/L (ref 98–110)
CO2: 31 mmol/L (ref 20–32)
CREATININE: 0.54 mg/dL — AB (ref 0.60–0.93)
Calcium: 10.4 mg/dL (ref 8.6–10.4)
GFR, Est African American: 107 mL/min/{1.73_m2} (ref >=60)
GFR, Est Non African American: 92 mL/min/{1.73_m2} (ref >=60)
GLOBULIN: 3.4 g/dL (ref 1.9–3.7)
Glucose, Bld: 87 mg/dL (ref 65–99)
POTASSIUM: 5 mmol/L (ref 3.5–5.3)
SODIUM: 141 mmol/L (ref 135–146)
Total Protein: 7.6 g/dL (ref 6.1–8.1)

## 2018-04-27 LAB — LIPID PANEL
CHOL/HDL RATIO: 3.1 (calc) (ref <5.0)
CHOLESTEROL: 228 mg/dL — AB (ref <200)
HDL: 74 mg/dL (ref >50)
LDL Cholesterol (Calc): 137 mg/dL (calc) — ABNORMAL HIGH
Non-HDL Cholesterol (Calc): 154 mg/dL (calc) — ABNORMAL HIGH (ref <130)
Triglycerides: 74 mg/dL (ref <150)

## 2018-04-27 MED ORDER — AMLODIPINE BESYLATE 5 MG PO TABS: 5.0000 mg | ORAL_TABLET | Freq: Every day | ORAL | 3 refills | Status: DC

## 2018-04-27 NOTE — Progress Notes (Signed)
Subjective:    Patient ID: [name], female    DOB: 10/14/1941, 77 y.o.   MRN: [id number]  HPI  10/2017 Patient is here today for complete physical exam.  Mammogram was performed in September and was normal. Her last colonoscopy was in 2013 and is not due again until 2023. Due to a she does not require a Pap smear. Her bone density is significant for osteoporosis however she is failed 3 different bisphosphonates as well as prolia and refuses to try any other medications.  I am thoroughly confused regarding her blood pressure medication.  Last year I had her on valsartan hydrochlorothiazide.  She discontinue this medication when she saw my partner this summer due to low blood pressure.  She began taking her husband's amlodipine.  We then prescribed amlodipine for her however later that summer she discontinued amlodipine and hydrochlorothiazide due to cough and polyuria.   we then placed her on losartan according to our last phone note.  She is supposed to be on 50 mg of losartan a day.  However she states that she has been taking valsartan hydrochlorothiazide 160/12.5 1/2 tablet daily over the last month which she independently started taking on her own due to high blood pressure however she wants to stop this 1 now because of polyuria.  I explained to the patient that I am confused.  I also explained to the patient that she is eventually going to hurt herself by starting and stopping numerous medications independently without consultation with a physician.  She also runs high risk of inadvertently taking duplicates of medications.  I thought she was taking losartan when she had resumed her valsartan hydrochlorothiazide which was an old prescription that she had at home from last August.  I was very clear with the patient that she must stop doing this.  She also tends to discontinue medication due to "side effects" that occur when she takes medication.  She thinks that over-the-counter vitamin D causes  her to have a cough but prescription vitamin D does not.  She felt bloated pain was causing a cough.  She feels bad taking every bisphosphonate and statin medication.  Many of the side effects are likely due to other problems or underlying medical conditions and not the medications themselves yet the patient attributes them to the medication and will frequently discontinue them independently.  I tried to explain the danger of doing this.  At that time, my plan was: I will schedule the patient for a bone density to monitor her osteoporosis.  Her mammogram is up-to-date.  She does not require a Pap smear.  She does not require colonoscopy.  Immunizations are up-to-date except the shingles vaccine which she refuses due to cost.  I was adamant with the patient that she must stop switching medication particularly doing it on her own without consultation.  She is inadvertently going to hurt herself.  We will start the patient today on losartan 50 mg a day and discontinue any other antihypertensive that she is taking.  I personally threw away the Diovan HCTZ that she has been taking the last 3 weeks.  I want to recheck her blood pressure in 1 month to determine what if any changes need to be made.  Overall the remainder of her lab work looks excellent.  Reassess blood pressure in 1 month.  12/18/17 The patient stopped her losartan due to polyuria.  I explained to the patient that I did not believe losartan is causing her  polyuria however she discontinued the medication independently anyway. As a result her blood pressure is 180/70 however the polyuria that she reported has subsided. She wants to switch to amlodipine because her husband takes that and it doesn't cause him any problems. I explained to the patient that she is tried that in the past and she discontinued it because she thought that it was causing her to have a cough.  At that time, my plan was: Patient is noncompliant with medication. She often attributes  any symptoms she has the medicine she is taking. She continues to discontinue blood pressure pills. As result her blood pressure today is uncontrolled. I will switch the patient back to amlodipine 10 mg a day and recheck her blood pressure in 3 weeks. I'm concerned that she will likely discontinue this medication in the future and I cautioned her against this.  04/27/18 Patient is currently taking amlodipine 5 mg a day.  Her blood pressure here is well controlled at 124/62.  She states that her blood pressure at home is also well controlled in the 120s over 80s.  She would prefer to have a 5 mg pill as opposed to break a 10 mg pill in half.  She denies any chest pain.  She reports her chronic shortness of breath.  She has been switched to the generic Advair from name brand Advair and she does not believe that it is working as well.  I try to explain to the patient that the medication is equivalent however she respectfully request that she be switched back to name brand Advair as she is convinced that it works better for her breathing.  She is lost 2 pounds since her last office visit and I do worry about protein calorie malnutrition given her age and frail stature  Past Medical History:  Diagnosis Date  . BV (bacterial vaginosis)   . Chronic UTI   . COPD (chronic obstructive pulmonary disease) ([place])   . FH: cataracts   . Hyperlipidemia   . Osteoporosis    Past Surgical History:  Procedure Laterality Date  . COLONOSCOPY  04/19/2012   Procedure: COLONOSCOPY;  Surgeon: Danie Binder, MD;  Location[facility];  Service: Endoscopy;  Laterality: N/A;  11:30 AM  . FRACTURE SURGERY     rt left knee cap   Current Outpatient Medications on File Prior to Visit  Medication Sig Dispense Refill  . Calcium Carbonate-Vit D-Min (CALCIUM 1200 PO) Take by mouth.    . Cholecalciferol (VITAMIN D) 2000 units CAPS Take by mouth.    . Fluticasone-Salmeterol (ADVAIR DISKUS) 250-50 MCG/DOSE AEPB INHALE ONE PUFF  INTO THE LUNGS TWICE DAILY. 60 each 3  . PROAIR HFA 108 (90 Base) MCG/ACT inhaler INHALE 2 PUFFS INTO THE LUNGS EVERY 6 HORUS AS NEEDED FOR WHEEZING/SHORTNESS OF BREATH. 8.5 g 3  . zolpidem (AMBIEN) 10 MG tablet TAKE ONE TABLET BY MOUTH AT BEDTIME AS NEEDED. 30 tablet 3   No current facility-administered medications on file prior to visit.    Allergies  Allergen Reactions  . Naproxen Sodium Nausea And Vomiting  . Symbicort [Budesonide-Formoterol Fumarate] Shortness Of Breath    Breathing difficulty  . Alendronate Sodium Other (See Comments)    Kidney failure  . Boniva [Ibandronate Sodium] Other (See Comments)    Kidney failure   . Risedronate Sodium   . Zetia [Ezetimibe] Other (See Comments)    Muscle pain   Social History   Socioeconomic History  . Marital status: Married  Spouse name: Not on file  . Number of children: Not on file  . Years of education: Not on file  . Highest education level: Not on file  Occupational History  . Not on file  Social Needs  . Financial resource strain: Not on file  . Food insecurity:    Worry: Not on file    Inability: Not on file  . Transportation needs:    Medical: Not on file    Non-medical: Not on file  Tobacco Use  . Smoking status: Former Research scientist (life sciences)  . Smokeless tobacco: Never Used  Substance and Sexual Activity  . Alcohol use: No  . Drug use: No  . Sexual activity: Not on file    Comment: married, husband smokes.  Lifestyle  . Physical activity:    Days per week: Not on file    Minutes per session: Not on file  . Stress: Not on file  Relationships  . Social connections:    Talks on phone: Not on file    Gets together: Not on file    Attends religious service: Not on file    Active member of club or organization: Not on file    Attends meetings of clubs or organizations: Not on file    Relationship status: Not on file  . Intimate partner violence:    Fear of current or ex partner: Not on file    Emotionally abused: Not  on file    Physically abused: Not on file    Forced sexual activity: Not on file  Other Topics Concern  . Not on file  Social History Narrative  . Not on file   Family History  Problem Relation Age of Onset  . Cancer Father        pancreatic      Review of Systems  All other systems reviewed and are negative.      Objective:   Physical Exam  Constitutional: She is oriented to person, place, and time. She appears well-developed and well-nourished. No distress.  HENT:  Head: Normocephalic and atraumatic.  Right Ear: External ear normal.  Left Ear: External ear normal.  Nose: Nose normal.  Mouth/Throat: Oropharynx is clear and moist. No oropharyngeal exudate.  Eyes: Pupils are equal, round, and reactive to light. Conjunctivae and EOM are normal. Right eye exhibits no discharge. Left eye exhibits no discharge. No scleral icterus.  Neck: Normal range of motion. Neck supple. No JVD present. No tracheal deviation present. No thyromegaly present.  Cardiovascular: Normal rate, regular rhythm, normal heart sounds and intact distal pulses. Exam reveals no gallop and no friction rub.  No murmur heard. Pulmonary/Chest: No accessory muscle usage or stridor. No respiratory distress. She has no decreased breath sounds. She has no wheezes. She has no rhonchi. She has no rales.  Abdominal: Soft. Bowel sounds are normal. She exhibits no distension and no mass. There is no tenderness. There is no rebound and no guarding.  Musculoskeletal: Normal range of motion. She exhibits no edema or tenderness.  Lymphadenopathy:    She has no cervical adenopathy.  Neurological: She is alert and oriented to person, place, and time. She has normal reflexes. No cranial nerve deficit. She exhibits normal muscle tone. Coordination normal.  Skin: Skin is warm. No rash noted. She is not diaphoretic. No erythema. No pallor.  Psychiatric: She has a normal mood and affect. Her behavior is normal. Judgment and thought  content normal.  Vitals reviewed.  Assessment & Plan:   Essential hypertension - Plan: CBC with Differential/Platelet, COMPLETE METABOLIC PANEL WITH GFR, Lipid panel  Blood pressures well controlled.  Continue amlodipine 5 mg a day.  Recheck CBC, CMP, fasting lipid panel.  I wrote the patient a prescription for Advair 250/50 1 inhalation twice daily name brand only.  I recommended that the patient start drinking an Ensure 1 can a day for early signs of protein calorie malnutrition.

## 2018-05-17 ENCOUNTER — Telehealth: Payer: Self-pay | Admitting: Family Medicine

## 2018-05-17 NOTE — Telephone Encounter (Signed)
-----   Message from Merry Proud sent at 05/05/2018  2:58 PM EDT ----- Regarding: RE: Prolia i called Edana Kowalchuk to schedule 1st prolia she said she doesnt want to have the prolia done because its too expensive i told her to call patient assistance # and she refused.  States that she will continue doing what she is currently doing.    ----- Message ----- From: Sheral Flow, LPN Sent: [id number]   4:34 PM To: Merry Proud Subject: Prolia                                         Please order Prolia.

## 2018-07-30 ENCOUNTER — Other Ambulatory Visit: Payer: Self-pay | Admitting: Family Medicine

## 2018-07-30 DIAGNOSIS — Z1231 Encounter for screening mammogram for malignant neoplasm of breast: Secondary | ICD-10-CM

## 2018-08-11 ENCOUNTER — Other Ambulatory Visit: Payer: Self-pay

## 2018-08-11 ENCOUNTER — Encounter: Payer: Self-pay | Admitting: Physician Assistant

## 2018-08-11 ENCOUNTER — Ambulatory Visit (INDEPENDENT_AMBULATORY_CARE_PROVIDER_SITE_OTHER): Payer: Medicare Other | Admitting: Physician Assistant

## 2018-08-11 VITALS — BP 140/68 | HR 94 | Temp 97.7°F | Resp 18 | Ht 63.0 in | Wt 109.6 lb

## 2018-08-11 DIAGNOSIS — N390 Urinary tract infection, site not specified: Secondary | ICD-10-CM | POA: Diagnosis not present

## 2018-08-11 DIAGNOSIS — R300 Dysuria: Secondary | ICD-10-CM | POA: Diagnosis not present

## 2018-08-11 LAB — MICROSCOPIC MESSAGE

## 2018-08-11 LAB — URINALYSIS, ROUTINE W REFLEX MICROSCOPIC
Bilirubin Urine: NEGATIVE
Glucose, UA: NEGATIVE
KETONES UR: NEGATIVE
NITRITE: NEGATIVE
PROTEIN: NEGATIVE
Specific Gravity, Urine: 1.02 (ref 1.001–1.03)
pH: 6 (ref 5.0–8.0)

## 2018-08-11 MED ORDER — CIPROFLOXACIN HCL 500 MG PO TABS: 500.0000 mg | ORAL_TABLET | Freq: Two times a day (BID) | ORAL | 0 refills | Status: DC

## 2018-08-11 NOTE — Progress Notes (Signed)
    Patient ID: BELVA KOZIEL MRN: [id number], DOB: 17-Dec-1940, 77 y.o. Date of Encounter: 08/11/2018, 2:42 PM    Chief Complaint:  Chief Complaint  Patient presents with  . Dysuria  . urine odor     HPI: 77 y.o. year old female presents with above.    She reports that she has been having some burning with urination and also noticing some smell when she urinates recently. She states that she has had no other signs or symptoms that she has aware of. Has had no fevers or chills.  No back pain.   No vaginal itching or irritation.     Home Meds:   Outpatient Medications Prior to Visit  Medication Sig Dispense Refill  . amLODipine (NORVASC) 5 MG tablet Take 1 tablet (5 mg total) by mouth daily. 90 tablet 3  . Calcium Carbonate-Vit D-Min (CALCIUM 1200 PO) Take by mouth.    . Cholecalciferol (VITAMIN D) 2000 units CAPS Take by mouth.    . Fluticasone-Salmeterol (ADVAIR DISKUS) 250-50 MCG/DOSE AEPB INHALE ONE PUFF INTO THE LUNGS TWICE DAILY. 60 each 3  . PROAIR HFA 108 (90 Base) MCG/ACT inhaler INHALE 2 PUFFS INTO THE LUNGS EVERY 6 HORUS AS NEEDED FOR WHEEZING/SHORTNESS OF BREATH. 8.5 g 3  . zolpidem (AMBIEN) 10 MG tablet TAKE ONE TABLET BY MOUTH AT BEDTIME AS NEEDED. 30 tablet 3   No facility-administered medications prior to visit.     Allergies:  Allergies  Allergen Reactions  . Naproxen Sodium Nausea And Vomiting  . Symbicort [Budesonide-Formoterol Fumarate] Shortness Of Breath    Breathing difficulty  . Alendronate Sodium Other (See Comments)    Kidney failure  . Boniva [Ibandronate Sodium] Other (See Comments)    Kidney failure   . Risedronate Sodium   . Zetia [Ezetimibe] Other (See Comments)    Muscle pain      Review of Systems: See HPI for pertinent ROS. All other ROS negative.    Physical Exam: Blood pressure 140/68, pulse 94, temperature 97.7 F (36.5 C), temperature source Oral, resp. rate 18, height 5\' 3"  (1.6 m), weight 49.7 kg, SpO2 96 %., Body mass  index is 19.41 kg/m. General:  Thin WF. Appears in no acute distress. Neck: Supple. No thyromegaly. No lymphadenopathy. Lungs: Clear bilaterally to auscultation without wheezes, rales, or rhonchi. Breathing is unlabored. Heart: Regular rhythm. No murmurs, rubs, or gallops. Abdomen: Soft, non-tender, non-distended with normoactive bowel sounds. No hepatomegaly. No rebound/guarding. No obvious abdominal masses. Msk:  Strength and tone normal for age.  No tenderness with percussion to costophrenic angles bilaterally. Extremities/Skin: Warm and dry.  Neuro: Alert and oriented X 3. Moves all extremities spontaneously. Gait is normal. CNII-XII grossly in tact. Psych:  Responds to questions appropriately with a normal affect.     ASSESSMENT AND PLAN:  77 y.o. year old female with   1. Urinary tract infection without hematuria, site unspecified Urinanalysis shows 2+ leukocyte.  Consistant with UTI.  Will treat with Cipro.  Will send culture. - Urine Culture - ciprofloxacin (CIPRO) 500 MG tablet; Take 1 tablet (500 mg total) by mouth 2 (two) times daily for 7 days.  Dispense: 14 tablet; Refill: 0  2. Dysuria - Urinalysis, Routine w reflex microscopic   Signed, [address] [place], [place], [facility] 08/11/2018 2:42 PM

## 2018-08-12 LAB — URINE CULTURE
MICRO NUMBER:: 91056262
SPECIMEN QUALITY:: ADEQUATE

## 2018-08-13 ENCOUNTER — Other Ambulatory Visit: Payer: Self-pay

## 2018-08-13 MED ORDER — METRONIDAZOLE 500 MG PO TABS: 500.0000 mg | ORAL_TABLET | Freq: Two times a day (BID) | ORAL | 0 refills | Status: DC

## 2018-08-20 ENCOUNTER — Other Ambulatory Visit: Payer: Self-pay | Admitting: Family Medicine

## 2018-08-25 ENCOUNTER — Encounter: Payer: Self-pay | Admitting: Physician Assistant

## 2018-08-25 ENCOUNTER — Ambulatory Visit (INDEPENDENT_AMBULATORY_CARE_PROVIDER_SITE_OTHER): Payer: Medicare Other | Admitting: Physician Assistant

## 2018-08-25 VITALS — BP 122/60 | HR 94 | Temp 97.9°F | Resp 18 | Ht 63.0 in | Wt 106.8 lb

## 2018-08-25 DIAGNOSIS — J439 Emphysema, unspecified: Secondary | ICD-10-CM | POA: Diagnosis not present

## 2018-08-25 DIAGNOSIS — J441 Chronic obstructive pulmonary disease with (acute) exacerbation: Secondary | ICD-10-CM

## 2018-08-25 MED ORDER — AZITHROMYCIN 250 MG PO TABS: ORAL_TABLET | ORAL | 0 refills | Status: AC

## 2018-08-25 MED ORDER — PREDNISONE 20 MG PO TABS: 20.0000 mg | ORAL_TABLET | Freq: Every day | ORAL | 0 refills | Status: DC

## 2018-08-25 NOTE — Progress Notes (Signed)
Patient ID: JAZZMINE KLEIMAN MRN: [id number], DOB: 1941-06-14, 77 y.o. Date of Encounter: 08/25/2018, 4:20 PM    Chief Complaint:  Chief Complaint  Patient presents with  . Cough  . yellow phlegm     HPI: 77 y.o. year old female presents with above.   She states that her grandson was sick and came over to her house.  Says now she has cough productive of thick dark phlegm.  Says that she has been using some Mucinex in addition to her Advair and pro-air.  Has had no fevers.  No congestion in her head or nose.  All on her chest.       Home Meds:   Outpatient Medications Prior to Visit  Medication Sig Dispense Refill  . ADVAIR DISKUS 250-50 MCG/DOSE AEPB INHALE ONE PUFF INTO THE LUNGS TWICE DAILY. 60 each 3  . amLODipine (NORVASC) 5 MG tablet Take 1 tablet (5 mg total) by mouth daily. 90 tablet 3  . Calcium Carbonate-Vit D-Min (CALCIUM 1200 PO) Take by mouth.    . Cholecalciferol (VITAMIN D) 2000 units CAPS Take by mouth.    Marland Kitchen PROAIR HFA 108 (90 Base) MCG/ACT inhaler INHALE 2 PUFFS INTO THE LUNGS EVERY 6 HORUS AS NEEDED FOR WHEEZING/SHORTNESS OF BREATH. 8.5 g 3  . zolpidem (AMBIEN) 10 MG tablet TAKE ONE TABLET BY MOUTH AT BEDTIME AS NEEDED. 30 tablet 3  . metroNIDAZOLE (FLAGYL) 500 MG tablet Take 1 tablet (500 mg total) by mouth 2 (two) times daily. 14 tablet 0   No facility-administered medications prior to visit.     Allergies:  Allergies  Allergen Reactions  . Naproxen Sodium Nausea And Vomiting  . Symbicort [Budesonide-Formoterol Fumarate] Shortness Of Breath    Breathing difficulty  . Alendronate Sodium Other (See Comments)    Kidney failure  . Boniva [Ibandronate Sodium] Other (See Comments)    Kidney failure   . Risedronate Sodium   . Zetia [Ezetimibe] Other (See Comments)    Muscle pain      Review of Systems: See HPI for pertinent ROS. All other ROS negative.    Physical Exam: Blood pressure 122/60, pulse 94, temperature 97.9 F (36.6 C), temperature  source Oral, resp. rate 18, height 5\' 3"  (1.6 m), weight 48.4 kg, SpO2 98 %., Body mass index is 18.92 kg/m. General: WNWD WF.  Appears in no acute distress. HEENT: Normocephalic, atraumatic, eyes without discharge, sclera non-icteric, nares are without discharge. Bilateral auditory canals clear, TM's are without perforation, pearly grey and translucent with reflective cone of light bilaterally. Oral cavity moist, posterior pharynx without exudate, erythema.  Neck: Supple. No thyromegaly. No lymphadenopathy. Lungs: Mild wheezes throughout bilaterally. Heart: Regular rhythm. No murmurs, rubs, or gallops. Msk:  Strength and tone normal for age. Extremities/Skin: Warm and dry.  Neuro: Alert and oriented X 3. Moves all extremities spontaneously. Gait is normal. CNII-XII grossly in tact. Psych:  Responds to questions appropriately with a normal affect.     ASSESSMENT AND PLAN:  77 y.o. year old female with   1. COPD exacerbation ([place]) She is to take the azithromycin and the prednisone as directed.  Also continue taking Advair routinely and use pro-air as needed for wheezing.  Aloe up if symptoms worsen or if they do not resolve after completion of these meds. - azithromycin (ZITHROMAX Z-PAK) 250 MG tablet; Take 2 tablets (500 mg) on  Day 1,  followed by 1 tablet (250 mg) once daily on Days 2 through 5.  Dispense: 6  each; Refill: 0 - predniSONE (DELTASONE) 20 MG tablet; Take 1 tablet (20 mg total) by mouth daily with breakfast.  Dispense: 5 tablet; Refill: 0  2. Pulmonary emphysema, unspecified emphysema type [facility])    Signed, [address] [place], [place], [facility] 08/25/2018 4:20 PM

## 2018-08-30 ENCOUNTER — Other Ambulatory Visit: Payer: Self-pay | Admitting: Family Medicine

## 2018-08-30 NOTE — Telephone Encounter (Signed)
Ok to refill??  Last office visit 08/25/2018.  Last refill 02/18/2018, #3 refills.

## 2018-09-02 ENCOUNTER — Encounter (HOSPITAL_COMMUNITY): Payer: Self-pay

## 2018-09-02 ENCOUNTER — Ambulatory Visit (HOSPITAL_COMMUNITY)
Admission: RE | Admit: 2018-09-02 | Discharge: 2018-09-02 | Disposition: A | Discharge status: Home / Self Care | Payer: Medicare Other | Source: Ambulatory Visit | Attending: Family Medicine | Admitting: Family Medicine

## 2018-09-02 DIAGNOSIS — Z1231 Encounter for screening mammogram for malignant neoplasm of breast: Secondary | ICD-10-CM | POA: Diagnosis not present

## 2018-09-09 ENCOUNTER — Encounter: Payer: Self-pay | Admitting: Family Medicine

## 2018-09-09 ENCOUNTER — Ambulatory Visit (INDEPENDENT_AMBULATORY_CARE_PROVIDER_SITE_OTHER): Payer: Medicare Other | Admitting: Family Medicine

## 2018-09-09 VITALS — BP 110/60 | Temp 98.5°F | Wt 105.0 lb

## 2018-09-09 DIAGNOSIS — J441 Chronic obstructive pulmonary disease with (acute) exacerbation: Secondary | ICD-10-CM

## 2018-09-09 MED ORDER — AZITHROMYCIN 250 MG PO TABS: ORAL_TABLET | ORAL | 0 refills | Status: DC

## 2018-09-09 MED ORDER — PREDNISONE 20 MG PO TABS: 40.0000 mg | ORAL_TABLET | Freq: Every day | ORAL | 0 refills | Status: DC

## 2018-09-09 NOTE — Progress Notes (Signed)
Subjective:    Patient ID: [name], female    DOB: 04-15-1941, 77 y.o.   MRN: [id number]  Patient reports cough for 3 weeks.  PMH is significant for COPD.  She has been using her Advair 2 puffs every day but over the last few months she is using it every other day trying to save money so that she does not go into the donut hole.  3 weeks ago, family member had an upper respiratory infection.  She believes she caught that from him.  Over the last few weeks, the cough is gradually worsening.  She is getting progressively short of breath.  She is reporting wheezing.  She is also developed sputum production.  Sputum is yellow.  She never produces sputum on her normal days.  She is using her pro-air once a day.  This helps but it does not last the full day.  She is continuing to use Advair every other day.  He denies any chest pain.  She denies any fever.  She denies any hemoptysis.  Pulse oximetry is 93% on room air Past Medical History:  Diagnosis Date  . BV (bacterial vaginosis)   . Chronic UTI   . COPD (chronic obstructive pulmonary disease) ([place])   . FH: cataracts   . Hyperlipidemia   . Osteoporosis    Past Surgical History:  Procedure Laterality Date  . COLONOSCOPY  04/19/2012   Procedure: COLONOSCOPY;  Surgeon: Danie Binder, MD;  Location: [facility];  Service: Endoscopy;  Laterality: N/A;  11:30 AM  . FRACTURE SURGERY     rt left knee cap   Current Outpatient Medications on File Prior to Visit  Medication Sig Dispense Refill  . ADVAIR DISKUS 250-50 MCG/DOSE AEPB INHALE ONE PUFF INTO THE LUNGS TWICE DAILY. 60 each 3  . amLODipine (NORVASC) 5 MG tablet Take 1 tablet (5 mg total) by mouth daily. 90 tablet 3  . Calcium Carbonate-Vit D-Min (CALCIUM 1200 PO) Take by mouth.    . Cholecalciferol (VITAMIN D) 2000 units CAPS Take by mouth.    . predniSONE (DELTASONE) 20 MG tablet Take 1 tablet (20 mg total) by mouth daily with breakfast. 5 tablet 0  . PROAIR HFA 108 (90 Base)  MCG/ACT inhaler INHALE 2 PUFFS INTO THE LUNGS EVERY 6 HORUS AS NEEDED FOR WHEEZING/SHORTNESS OF BREATH. 8.5 g 3  . zolpidem (AMBIEN) 10 MG tablet TAKE ONE TABLET BY MOUTH AT BEDTIME AS NEEDED. 30 tablet 0   No current facility-administered medications on file prior to visit.    Allergies  Allergen Reactions  . Naproxen Sodium Nausea And Vomiting  . Symbicort [Budesonide-Formoterol Fumarate] Shortness Of Breath    Breathing difficulty  . Alendronate Sodium Other (See Comments)    Kidney failure  . Boniva [Ibandronate Sodium] Other (See Comments)    Kidney failure   . Risedronate Sodium   . Zetia [Ezetimibe] Other (See Comments)    Muscle pain   Social History   Socioeconomic History  . Marital status: Married    Spouse name: Not on file  . Number of children: Not on file  . Years of education: Not on file  . Highest education level: Not on file  Occupational History  . Not on file  Social Needs  . Financial resource strain: Not on file  . Food insecurity:    Worry: Not on file    Inability: Not on file  . Transportation needs:    Medical: Not on file  Non-medical: Not on file  Tobacco Use  . Smoking status: Former Research scientist (life sciences)  . Smokeless tobacco: Never Used  Substance and Sexual Activity  . Alcohol use: No  . Drug use: No  . Sexual activity: Not on file    Comment: married, husband smokes.  Lifestyle  . Physical activity:    Days per week: Not on file    Minutes per session: Not on file  . Stress: Not on file  Relationships  . Social connections:    Talks on phone: Not on file    Gets together: Not on file    Attends religious service: Not on file    Active member of club or organization: Not on file    Attends meetings of clubs or organizations: Not on file    Relationship status: Not on file  . Intimate partner violence:    Fear of current or ex partner: Not on file    Emotionally abused: Not on file    Physically abused: Not on file    Forced sexual  activity: Not on file  Other Topics Concern  . Not on file  Social History Narrative  . Not on file   Family History  Problem Relation Age of Onset  . Cancer Father        pancreatic      Review of Systems  Respiratory: Positive for cough.   All other systems reviewed and are negative.      Objective:   Physical Exam  Constitutional: She appears well-developed and well-nourished. No distress.  HENT:  Head: Normocephalic and atraumatic.  Neck: No JVD present. No tracheal deviation present.  Cardiovascular: Normal rate, regular rhythm, normal heart sounds and intact distal pulses. Exam reveals no gallop and no friction rub.  No murmur heard. Pulmonary/Chest: Effort normal. No accessory muscle usage or stridor. No tachypnea. She has decreased breath sounds. She has wheezes. She has no rhonchi. She has no rales.  Abdominal: Soft. Bowel sounds are normal.  Musculoskeletal: She exhibits no edema.  Lymphadenopathy:    She has no cervical adenopathy.  Neurological: She is alert. She has normal reflexes.  Skin: Skin is warm. She is not diaphoretic.  Vitals reviewed.         Assessment & Plan:   COPD exacerbation ([place])  Patient is having a COPD exacerbation.  She is having increasing wheezing, change in sputum production and quality, and increasing shortness of breath.  Recommended prednisone 40 mg a day for 7 days.  Recommended a Z-Pak.  Recommended pro-air 2 puffs inhaled every 4-6 hours as needed.  Cautioned the patient that she must use Advair every day to receive full benefit.  Recheck in 48 hours if no better or sooner if worse

## 2018-09-23 ENCOUNTER — Ambulatory Visit (HOSPITAL_COMMUNITY)
Admission: RE | Admit: 2018-09-23 | Discharge: 2018-09-23 | Disposition: A | Discharge status: Home / Self Care | Payer: Medicare Other | Source: Ambulatory Visit | Attending: Family Medicine | Admitting: Family Medicine

## 2018-09-23 ENCOUNTER — Ambulatory Visit (INDEPENDENT_AMBULATORY_CARE_PROVIDER_SITE_OTHER): Payer: Medicare Other | Admitting: Family Medicine

## 2018-09-23 ENCOUNTER — Other Ambulatory Visit: Payer: Self-pay

## 2018-09-23 ENCOUNTER — Encounter: Payer: Self-pay | Admitting: Family Medicine

## 2018-09-23 VITALS — BP 110/64 | HR 90 | Temp 97.9°F | Resp 18 | Ht 63.0 in | Wt 107.0 lb

## 2018-09-23 DIAGNOSIS — R05 Cough: Secondary | ICD-10-CM | POA: Diagnosis not present

## 2018-09-23 DIAGNOSIS — J441 Chronic obstructive pulmonary disease with (acute) exacerbation: Secondary | ICD-10-CM

## 2018-09-23 DIAGNOSIS — J8410 Pulmonary fibrosis, unspecified: Secondary | ICD-10-CM | POA: Diagnosis not present

## 2018-09-23 DIAGNOSIS — J449 Chronic obstructive pulmonary disease, unspecified: Secondary | ICD-10-CM | POA: Insufficient documentation

## 2018-09-23 DIAGNOSIS — J439 Emphysema, unspecified: Secondary | ICD-10-CM | POA: Diagnosis not present

## 2018-09-23 DIAGNOSIS — Z8701 Personal history of pneumonia (recurrent): Secondary | ICD-10-CM

## 2018-09-23 MED ORDER — BENZONATATE 100 MG PO CAPS: 100.0000 mg | ORAL_CAPSULE | Freq: Three times a day (TID) | ORAL | 0 refills | Status: DC | PRN

## 2018-09-23 MED ORDER — ALBUTEROL SULFATE (2.5 MG/3ML) 0.083% IN NEBU
2.5000 mg | INHALATION_SOLUTION | Freq: Once | RESPIRATORY_TRACT | Status: AC
  Administered 2018-09-23: 2.5 mg via RESPIRATORY_TRACT

## 2018-09-23 MED ORDER — PREDNISONE 20 MG PO TABS: ORAL_TABLET | ORAL | 0 refills | Status: DC

## 2018-09-23 MED ORDER — GUAIFENESIN ER 600 MG PO TB12: 600.0000 mg | ORAL_TABLET | Freq: Two times a day (BID) | ORAL | 0 refills | Status: AC

## 2018-09-23 NOTE — Patient Instructions (Signed)
Please go get your chest x-ray done at [facility], I will call you with results and if there is any need to add on another round of antibiotics.

## 2018-09-23 NOTE — Progress Notes (Signed)
Patient ID: [name], female    DOB: [birth year], 77 y.o.   MRN: [id number]  PCP: Susy Frizzle, MD  Chief Complaint  Patient presents with  . COPD    saw PCP on 10/3- was given ZPack and Prednisone- no relief- productive cough with white thick sputum, wheezing, SO    Subjective:   [name] is a 77 y.o. female, presents to clinic with CC of cough and congestion x 4 weeks since being around grandchild who was ill.  She saw her PCP on 09/09/2018, roughly 12 days ago, and was given Z-Pak and prednisone she reports that the productive cough did improve a little bit with less volume of sputum, sputum is to be yellow and is now white.  She continues to feel congested in her nose and chest, with wheeze and she feels like she is having worsening shortness of breath with exertion such as going up her stairs.  She denies any chest pain, no current fever. She has hx of pneumonia "years ago", she currently uses Advair daily and albuterol inhaler for rescue.     Patient Active Problem List   Diagnosis Date Noted  . Hypertension 05/12/2017  . Chronic insomnia 06/09/2016  . Protein-calorie malnutrition, severe ([place]) 03/23/2014  . CAP (community acquired pneumonia) 03/22/2014  . Pneumonia 03/22/2014  . Acute respiratory failure with hypoxia ([place]) 03/22/2014  . COPD exacerbation ([place]) 12/21/2013  . Chronic tachycardia 12/21/2013  . Postmenopausal osteoporosis   . Hyperlipidemia   . COPD (chronic obstructive pulmonary disease) ([place])   . Chronic UTI   . FH: cataracts      Prior to Admission medications   Medication Sig Start Date End Date Taking? Authorizing Provider  ADVAIR DISKUS 250-50 MCG/DOSE AEPB INHALE ONE PUFF INTO THE LUNGS TWICE DAILY. 08/20/18  Yes Susy Frizzle, MD  amLODipine (NORVASC) 5 MG tablet Take 1 tablet (5 mg total) by mouth daily. 04/27/18  Yes Susy Frizzle, MD  Calcium Carbonate-Vit D-Min (CALCIUM 1200 PO) Take by mouth.   Yes [provider]  Cholecalciferol (VITAMIN D) 2000 units CAPS Take by mouth.   Yes [provider]  PROAIR HFA 108 (90 Base) MCG/ACT inhaler INHALE 2 PUFFS INTO THE LUNGS EVERY 6 HORUS AS NEEDED FOR WHEEZING/SHORTNESS OF BREATH. 01/25/18  Yes Susy Frizzle, MD  zolpidem (AMBIEN) 10 MG tablet TAKE ONE TABLET BY MOUTH AT BEDTIME AS NEEDED. 08/30/18  Yes Susy Frizzle, MD     Allergies  Allergen Reactions  . Naproxen Sodium Nausea And Vomiting  . Symbicort [Budesonide-Formoterol Fumarate] Shortness Of Breath    Breathing difficulty  . Alendronate Sodium Other (See Comments)    Kidney failure  . Boniva [Ibandronate Sodium] Other (See Comments)    Kidney failure   . Risedronate Sodium   . Zetia [Ezetimibe] Other (See Comments)    Muscle pain     Family History  Problem Relation Age of Onset  . Cancer Father        pancreatic     Social History   Socioeconomic History  . Marital status: Married    Spouse name: Not on file  . Number of children: Not on file  . Years of education: Not on file  . Highest education level: Not on file  Occupational History  . Not on file  Social Needs  . Financial resource strain: Not on file  . Food insecurity:    Worry: Not on file  Inability: Not on file  . Transportation needs:    Medical: Not on file    Non-medical: Not on file  Tobacco Use  . Smoking status: Former Research scientist (life sciences)  . Smokeless tobacco: Never Used  Substance and Sexual Activity  . Alcohol use: No  . Drug use: No  . Sexual activity: Not on file    Comment: married, husband smokes.  Lifestyle  . Physical activity:    Days per week: Not on file    Minutes per session: Not on file  . Stress: Not on file  Relationships  . Social connections:    Talks on phone: Not on file    Gets together: Not on file    Attends religious service: Not on file    Active member of club or organization: Not on file    Attends meetings of clubs or organizations: Not on file     Relationship status: Not on file  . Intimate partner violence:    Fear of current or ex partner: Not on file    Emotionally abused: Not on file    Physically abused: Not on file    Forced sexual activity: Not on file  Other Topics Concern  . Not on file  Social History Narrative  . Not on file     Review of Systems     Objective:    Vitals:   09/23/18 1117  BP: 110/64  Pulse: 90  Resp: 18  Temp: 97.9 F (36.6 C)  TempSrc: Oral  SpO2: 98%  Weight: 107 lb (48.5 kg)  Height: 5\' 3"  (1.6 m)      Physical Exam  Constitutional: She appears well-developed. No distress.  Thin elderly female, appears stated age, no acute distress  HENT:  Head: Normocephalic and atraumatic.  Nose: Nose normal.  Mouth/Throat: Oropharynx is clear and moist. No oropharyngeal exudate.  Eyes: Pupils are equal, round, and reactive to light. Conjunctivae are normal. Right eye exhibits no discharge. Left eye exhibits no discharge.  Neck: Normal range of motion. No tracheal deviation present.  Cardiovascular: Normal rate and regular rhythm. PMI is not displaced. Exam reveals no gallop and no friction rub.  No murmur heard. Pulses:      Radial pulses are 2+ on the right side, and 2+ on the left side.       Posterior tibial pulses are 2+ on the right side, and 2+ on the left side.  No lower extremity edema  Pulmonary/Chest: Effort normal. No accessory muscle usage or stridor. No tachypnea. No respiratory distress. She has wheezes. She has no rhonchi. She has no rales. She exhibits no tenderness.  Distant breath sounds throughout mid to lower lung fields, expiratory wheeze heard throughout  Abdominal: Soft. Bowel sounds are normal. She exhibits no distension. There is no tenderness.  Musculoskeletal: Normal range of motion.  Lymphadenopathy:    She has no cervical adenopathy.  Neurological: She is alert. She exhibits normal muscle tone. Coordination normal.  Skin: Skin is warm and dry. No rash noted.  She is not diaphoretic.  Psychiatric: She has a normal mood and affect. Her behavior is normal.  Nursing note and vitals reviewed.         Assessment & Plan:      ICD-10-CM   1. COPD exacerbation (H[facility]) J44.1 DG Chest 2 View    albuterol (PROVENTIL) (2.5 MG/3ML) 0.083% nebulizer solution 2.5 mg    guaiFENesin (MUCINEX) 600 MG 12 hr tablet    predniSONE (DELTASONE) 20 MG tablet  benzonatate (TESSALON) 100 MG capsule  2. Pulmonary emphysema, unspecified emphysema type (H[facility]) J43.9 DG Chest 2 View    albuterol (PROVENTIL) (2.5 MG/3ML) 0.083% nebulizer solution 2.5 mg  3. Hx of bacterial pneumonia Z87.01 DG Chest 50 View    77 year old female presents with several weeks productive cough and chest congestion with worsening wheeze and shortness of breath, treated 2 weeks ago with Z-Pak and prednisone she had a little bit of improvement with volume in color of sputum but she continued to have chest congestion and exertional shortness of breath and wheeze.  No symptoms or physical exam findings overtly suggestive of CHF, patient does look fairly thin barrel chested consistent with her history of emphysema and COPD.  She does have distant breath sounds on exam and expiratory wheeze which cleared after breathing treatment was given.   Chart, patient has not had a chest x-ray since 2016, with prolonged symptoms and treatment already with Z-Pak and steroids will obtain a chest x-ray to determine need for antibiotics.   Start treatment with repeated steroid burst, cough suppressants, Mucinex, more frequent use of inhaler for wheeze and shortness of breath symptoms.   Delsa Grana, PA-C 09/23/18 11:35 AM

## 2018-10-22 ENCOUNTER — Ambulatory Visit (INDEPENDENT_AMBULATORY_CARE_PROVIDER_SITE_OTHER): Payer: Medicare Other

## 2018-10-22 ENCOUNTER — Other Ambulatory Visit: Payer: Self-pay | Admitting: Family Medicine

## 2018-10-22 ENCOUNTER — Other Ambulatory Visit: Payer: Medicare Other

## 2018-10-22 DIAGNOSIS — I10 Essential (primary) hypertension: Secondary | ICD-10-CM | POA: Diagnosis not present

## 2018-10-22 DIAGNOSIS — E785 Hyperlipidemia, unspecified: Secondary | ICD-10-CM | POA: Diagnosis not present

## 2018-10-22 DIAGNOSIS — Z23 Encounter for immunization: Secondary | ICD-10-CM | POA: Diagnosis not present

## 2018-10-22 DIAGNOSIS — J439 Emphysema, unspecified: Secondary | ICD-10-CM

## 2018-10-22 DIAGNOSIS — E559 Vitamin D deficiency, unspecified: Secondary | ICD-10-CM

## 2018-10-22 LAB — CBC WITH DIFFERENTIAL/PLATELET
BASOS ABS: 144 {cells}/uL (ref 0–200)
Basophils Relative: 1.4 %
EOS PCT: 2.3 %
Eosinophils Absolute: 237 cells/uL (ref 15–500)
HCT: 46.5 % — ABNORMAL HIGH (ref 35.0–45.0)
Hemoglobin: 16 g/dL — ABNORMAL HIGH (ref 11.7–15.5)
Lymphs Abs: 2019 cells/uL (ref 850–3900)
MCH: 32.6 pg (ref 27.0–33.0)
MCHC: 34.4 g/dL (ref 32.0–36.0)
MCV: 94.7 fL (ref 80.0–100.0)
MPV: 10.8 fL (ref 7.5–12.5)
Monocytes Relative: 6.2 %
NEUTROS PCT: 70.5 %
Neutro Abs: 7262 cells/uL (ref 1500–7800)
PLATELETS: 428 10*3/uL — AB (ref 140–400)
RBC: 4.91 10*6/uL (ref 3.80–5.10)
RDW: 13.7 % (ref 11.0–15.0)
TOTAL LYMPHOCYTE: 19.6 %
WBC mixed population: 639 cells/uL (ref 200–950)
WBC: 10.3 10*3/uL (ref 3.8–10.8)

## 2018-10-22 LAB — LIPID PANEL
Cholesterol: 238 mg/dL — ABNORMAL HIGH (ref <200)
HDL: 71 mg/dL (ref >50)
LDL CHOLESTEROL (CALC): 150 mg/dL — AB
NON-HDL CHOLESTEROL (CALC): 167 mg/dL — AB (ref <130)
TRIGLYCERIDES: 73 mg/dL (ref <150)
Total CHOL/HDL Ratio: 3.4 (calc) (ref <5.0)

## 2018-10-22 LAB — COMPREHENSIVE METABOLIC PANEL
AG Ratio: 1.4 (calc) (ref 1.0–2.5)
ALT: 26 U/L (ref 6–29)
AST: 38 U/L — AB (ref 10–35)
Albumin: 4.2 g/dL (ref 3.6–5.1)
Alkaline phosphatase (APISO): 151 U/L — ABNORMAL HIGH (ref 33–130)
BUN / CREAT RATIO: 32 (calc) — AB (ref 6–22)
BUN: 19 mg/dL (ref 7–25)
CO2: 28 mmol/L (ref 20–32)
Calcium: 9.9 mg/dL (ref 8.6–10.4)
Chloride: 103 mmol/L (ref 98–110)
Creat: 0.59 mg/dL — ABNORMAL LOW (ref 0.60–0.93)
GLUCOSE: 98 mg/dL (ref 65–99)
Globulin: 3 g/dL (calc) (ref 1.9–3.7)
Potassium: 4.4 mmol/L (ref 3.5–5.3)
SODIUM: 140 mmol/L (ref 135–146)
TOTAL PROTEIN: 7.2 g/dL (ref 6.1–8.1)
Total Bilirubin: 0.7 mg/dL (ref 0.2–1.2)

## 2018-10-22 NOTE — Progress Notes (Signed)
Patient came in today to receive annual flu vaccine. Patient was given high dose fluzone vaccine in the left deltoid. She tolerated well  VIS was given.

## 2018-10-22 NOTE — Telephone Encounter (Signed)
Ok to refill??  Last office visit 09/23/2018.  Last refill 08/30/2018.

## 2018-10-29 ENCOUNTER — Encounter: Payer: Medicare Other | Admitting: Family Medicine

## 2018-11-12 ENCOUNTER — Encounter: Payer: Self-pay | Admitting: Family Medicine

## 2018-11-12 ENCOUNTER — Encounter: Payer: Medicare Other | Admitting: Family Medicine

## 2018-11-12 ENCOUNTER — Ambulatory Visit (INDEPENDENT_AMBULATORY_CARE_PROVIDER_SITE_OTHER): Payer: Medicare Other | Admitting: Family Medicine

## 2018-11-12 VITALS — BP 132/60 | HR 110 | Temp 97.9°F | Resp 18 | Ht 63.0 in | Wt 104.0 lb

## 2018-11-12 DIAGNOSIS — J441 Chronic obstructive pulmonary disease with (acute) exacerbation: Secondary | ICD-10-CM

## 2018-11-12 DIAGNOSIS — Z0000 Encounter for general adult medical examination without abnormal findings: Secondary | ICD-10-CM

## 2018-11-12 DIAGNOSIS — J439 Emphysema, unspecified: Secondary | ICD-10-CM | POA: Diagnosis not present

## 2018-11-12 DIAGNOSIS — I10 Essential (primary) hypertension: Secondary | ICD-10-CM

## 2018-11-12 DIAGNOSIS — E43 Unspecified severe protein-calorie malnutrition: Secondary | ICD-10-CM | POA: Diagnosis not present

## 2018-11-12 DIAGNOSIS — F5104 Psychophysiologic insomnia: Secondary | ICD-10-CM

## 2018-11-12 MED ORDER — PREDNISONE 20 MG PO TABS: 40.0000 mg | ORAL_TABLET | Freq: Every day | ORAL | 0 refills | Status: DC

## 2018-11-12 MED ORDER — ZOLPIDEM TARTRATE 10 MG PO TABS: 10.0000 mg | ORAL_TABLET | Freq: Every evening | ORAL | 1 refills | Status: DC | PRN

## 2018-11-12 MED ORDER — DOXYCYCLINE HYCLATE 100 MG PO TABS: 100.0000 mg | ORAL_TABLET | Freq: Two times a day (BID) | ORAL | 0 refills | Status: DC

## 2018-11-12 NOTE — Progress Notes (Signed)
Subjective:    Patient ID: [name], female    DOB: 10/26/1941, 77 y.o.   MRN: [id number]  HPI  2018 Patient is here today for complete physical exam.  Mammogram was performed in September and was normal. Her last colonoscopy was in 2013 and is not due again until 2023. Due to a she does not require a Pap smear. Her bone density is significant for osteoporosis however she is failed 3 different bisphosphonates as well as prolia and refuses to try any other medications.  I am thoroughly confused regarding her blood pressure medication.  Last year I had her on valsartan hydrochlorothiazide.  She discontinue this medication when she saw my partner this summer due to low blood pressure.  She began taking her husband's amlodipine.  We then prescribed amlodipine for her however later that summer she discontinued amlodipine and hydrochlorothiazide due to cough and polyuria.   we then placed her on losartan according to our last phone note.  She is supposed to be on 50 mg of losartan a day.  However she states that she has been taking valsartan hydrochlorothiazide 160/12.5 1/2 tablet daily over the last month which she independently started taking on her own due to high blood pressure however she wants to stop this 1 now because of polyuria.  I explained to the patient that I am confused.  I also explained to the patient that she is eventually going to hurt herself by starting and stopping numerous medications independently without consultation with a physician.  She also runs high risk of inadvertently taking duplicates of medications.  I thought she was taking losartan when she had resumed her valsartan hydrochlorothiazide which was an old prescription that she had at home from last August.  I was very clear with the patient that she must stop doing this.  She also tends to discontinue medication due to "side effects" that occur when she takes medication.  She thinks that over-the-counter vitamin D causes  her to have a cough but prescription vitamin D does not.  She felt bloated pain was causing a cough.  She feels bad taking every bisphosphonate and statin medication.  Many of the side effects are likely due to other problems or underlying medical conditions and not the medications themselves yet the patient attributes them to the medication and will frequently discontinue them independently.  I tried to explain the danger of doing this.  At that time, my plan was: I will schedule the patient for a bone density to monitor her osteoporosis.  Her mammogram is up-to-date.  She does not require a Pap smear.  She does not require colonoscopy.  Immunizations are up-to-date except the shingles vaccine which she refuses due to cost.  I was adamant with the patient that she must stop switching medication particularly doing it on her own without consultation.  She is inadvertently going to hurt herself.  We will start the patient today on losartan 50 mg a day and discontinue any other antihypertensive that she is taking.  I personally threw away the Diovan HCTZ that she has been taking the last 3 weeks.  I want to recheck her blood pressure in 1 month to determine what if any changes need to be made.  Overall the remainder of her lab work looks excellent.  Reassess blood pressure in 1 month.  11/12/18 Here today for CPE.  From a respiratory standpoint, the patient is doing poorly.  She states that her breathing never truly recovered  from earlier this fall when she took a Z-Pak and some prednisone.  Today on exam she has diminished breath sounds bilaterally with diffuse expiratory wheezes.  She also has fine high-pitched crackles bilaterally that are a new finding and concerning for underlying fibrosis.  She has increased work of breathing and increased respiratory rate.  Her oxygen is 93% on room air.  She is currently using Advair but she is only using it 4 days out of the week due to cost.  She had her mammogram in  September which was normal.  Due to her age she does not require Pap smear.  Given her frailty and her age I would not recommend a colonoscopy.  Her last bone density was performed in December of last year and was significant for a T score of -3.9 and her left hip.  She continues to refuse bisphosphonate therapy.  She is tried and refuses Prolia.  At present she is only willing to take calcium and vitamin D.  I spent more than 15 minutes today with the patient explaining the risk of fracture given how frail she is.  I explained to the patient that if she does fall and breaks a bone given her poor pulmonary status, she would likely get pneumonia and suffer serious consequences.  I strongly encouraged her to focus on prevention and take something to improve her osteoporosis but she continues to refuse . Lab on 10/22/2018  Component Date Value Ref Range Status  . Cholesterol 10/22/2018 238* <200 mg/dL Final  . HDL 10/22/2018 71  >50 mg/dL Final  . Triglycerides 10/22/2018 73  <150 mg/dL Final  . LDL Cholesterol (Calc) 10/22/2018 150* mg/dL (calc) Final   Comment: Reference range: <100 . Desirable range <100 mg/dL for primary prevention;   <70 mg/dL for patients with CHD or diabetic patients  with > or = 2 CHD risk factors. Marland Kitchen LDL-C is now calculated using the Martin-Hopkins  calculation, which is a validated novel method providing  better accuracy than the Friedewald equation in the  estimation of LDL-C.  Cresenciano Genre et al. Annamaria Helling. 7858;850(27): 2061-2068  (http://education.[facility].com/faq/FAQ164)   . Total CHOL/HDL Ratio 10/22/2018 3.4  <5.0 (calc) Final  . Non-HDL Cholesterol (Calc) 10/22/2018 167* <130 mg/dL (calc) Final   Comment: For patients with diabetes plus 1 major ASCVD risk  factor, treating to a non-HDL-C goal of <100 mg/dL  (LDL-C of <70 mg/dL) is considered a therapeutic  option.   . WBC 10/22/2018 10.3  3.8 - 10.8 Thousand/uL Final  . RBC 10/22/2018 4.91  3.80 - 5.10  Million/uL Final  . Hemoglobin 10/22/2018 16.0* 11.7 - 15.5 g/dL Final  . HCT 10/22/2018 46.5* 35.0 - 45.0 % Final  . MCV 10/22/2018 94.7  80.0 - 100.0 fL Final  . MCH 10/22/2018 32.6  27.0 - 33.0 pg Final  . MCHC 10/22/2018 34.4  32.0 - 36.0 g/dL Final  . RDW 10/22/2018 13.7  11.0 - 15.0 % Final  . Platelets 10/22/2018 428* 140 - 400 Thousand/uL Final  . MPV 10/22/2018 10.8  7.5 - 12.5 fL Final  . Neutro Abs 10/22/2018 7,262  1,500 - 7,800 cells/uL Final  . Lymphs Abs 10/22/2018 2,019  850 - 3,900 cells/uL Final  . WBC mixed population 10/22/2018 639  200 - 950 cells/uL Final  . Eosinophils Absolute 10/22/2018 237  15 - 500 cells/uL Final  . Basophils Absolute 10/22/2018 144  0 - 200 cells/uL Final  . Neutrophils Relative % 10/22/2018 70.5  % Final  .  Total Lymphocyte 10/22/2018 19.6  % Final  . Monocytes Relative 10/22/2018 6.2  % Final  . Eosinophils Relative 10/22/2018 2.3  % Final  . Basophils Relative 10/22/2018 1.4  % Final  . Glucose, Bld 10/22/2018 98  65 - 99 mg/dL Final   Comment: .            Fasting reference interval .   . BUN 10/22/2018 19  7 - 25 mg/dL Final  . Creat 10/22/2018 0.59* 0.60 - 0.93 mg/dL Final   Comment: For patients >66 years of age, the reference limit for Creatinine is approximately 13% higher for people identified as African-American. .   Havery Moros Ratio 10/22/2018 32* 6 - 22 (calc) Final  . Sodium 10/22/2018 140  135 - 146 mmol/L Final  . Potassium 10/22/2018 4.4  3.5 - 5.3 mmol/L Final  . Chloride 10/22/2018 103  98 - 110 mmol/L Final  . CO2 10/22/2018 28  20 - 32 mmol/L Final  . Calcium 10/22/2018 9.9  8.6 - 10.4 mg/dL Final  . Total Protein 10/22/2018 7.2  6.1 - 8.1 g/dL Final  . Albumin 10/22/2018 4.2  3.6 - 5.1 g/dL Final  . Globulin 10/22/2018 3.0  1.9 - 3.7 g/dL (calc) Final  . AG Ratio 10/22/2018 1.4  1.0 - 2.5 (calc) Final  . Total Bilirubin 10/22/2018 0.7  0.2 - 1.2 mg/dL Final  . Alkaline phosphatase (APISO) 10/22/2018  151* 33 - 130 U/L Final  . AST 10/22/2018 38* 10 - 35 U/L Final  . ALT 10/22/2018 26  6 - 29 U/L Final   Past Medical History:  Diagnosis Date  . BV (bacterial vaginosis)   . Chronic UTI   . COPD (chronic obstructive pulmonary disease) ([place])   . FH: cataracts   . Hyperlipidemia   . Osteoporosis    Past Surgical History:  Procedure Laterality Date  . COLONOSCOPY  04/19/2012   Procedure: COLONOSCOPY;  Surgeon: Danie Binder, MD;  Location: [facility];  Service: Endoscopy;  Laterality: N/A;  11:30 AM  . FRACTURE SURGERY     rt left knee cap   Current Outpatient Medications on File Prior to Visit  Medication Sig Dispense Refill  . ADVAIR DISKUS 250-50 MCG/DOSE AEPB INHALE ONE PUFF INTO THE LUNGS TWICE DAILY. 60 each 3  . amLODipine (NORVASC) 5 MG tablet Take 1 tablet (5 mg total) by mouth daily. 90 tablet 3  . Calcium Carbonate-Vit D-Min (CALCIUM 1200 PO) Take by mouth.    . Cholecalciferol (VITAMIN D) 2000 units CAPS Take by mouth.    Marland Kitchen PROAIR HFA 108 (90 Base) MCG/ACT inhaler INHALE 2 PUFFS INTO THE LUNGS EVERY 6 HORUS AS NEEDED FOR WHEEZING/SHORTNESS OF BREATH. 8.5 g 3  . zolpidem (AMBIEN) 10 MG tablet TAKE ONE TABLET BY MOUTH AT BEDTIME AS NEEDED. 30 tablet 0   No current facility-administered medications on file prior to visit.    Allergies  Allergen Reactions  . Naproxen Sodium Nausea And Vomiting  . Symbicort [Budesonide-Formoterol Fumarate] Shortness Of Breath    Breathing difficulty  . Alendronate Sodium Other (See Comments)    Kidney failure  . Boniva [Ibandronate Sodium] Other (See Comments)    Kidney failure   . Risedronate Sodium   . Zetia [Ezetimibe] Other (See Comments)    Muscle pain   Social History   Socioeconomic History  . Marital status: Married    Spouse name: Not on file  . Number of children: Not on file  . Years of  education: Not on file  . Highest education level: Not on file  Occupational History  . Not on file  Social Needs  .  Financial resource strain: Not on file  . Food insecurity:    Worry: Not on file    Inability: Not on file  . Transportation needs:    Medical: Not on file    Non-medical: Not on file  Tobacco Use  . Smoking status: Former Research scientist (life sciences)  . Smokeless tobacco: Never Used  Substance and Sexual Activity  . Alcohol use: No  . Drug use: No  . Sexual activity: Not on file    Comment: married, husband smokes.  Lifestyle  . Physical activity:    Days per week: Not on file    Minutes per session: Not on file  . Stress: Not on file  Relationships  . Social connections:    Talks on phone: Not on file    Gets together: Not on file    Attends religious service: Not on file    Active member of club or organization: Not on file    Attends meetings of clubs or organizations: Not on file    Relationship status: Not on file  . Intimate partner violence:    Fear of current or ex partner: Not on file    Emotionally abused: Not on file    Physically abused: Not on file    Forced sexual activity: Not on file  Other Topics Concern  . Not on file  Social History Narrative  . Not on file   Family History  Problem Relation Age of Onset  . Cancer Father        pancreatic      Review of Systems  All other systems reviewed and are negative.      Objective:   Physical Exam  Constitutional: She is oriented to person, place, and time. She appears well-developed and well-nourished. No distress.  HENT:  Head: Normocephalic and atraumatic.  Right Ear: External ear normal.  Left Ear: External ear normal.  Nose: Nose normal.  Mouth/Throat: Oropharynx is clear and moist. No oropharyngeal exudate.  Eyes: Pupils are equal, round, and reactive to light. Conjunctivae and EOM are normal. Right eye exhibits no discharge. Left eye exhibits no discharge. No scleral icterus.  Neck: Normal range of motion. Neck supple. No JVD present. No tracheal deviation present. No thyromegaly present.  Cardiovascular:  Normal rate, regular rhythm, normal heart sounds and intact distal pulses. Exam reveals no gallop and no friction rub.  No murmur heard. Pulmonary/Chest: Accessory muscle usage present. No stridor. No respiratory distress. She has decreased breath sounds. She has wheezes. She has no rhonchi. She has rales.  Abdominal: Soft. Bowel sounds are normal. She exhibits no distension and no mass. There is no tenderness. There is no rebound and no guarding.  Musculoskeletal: Normal range of motion. She exhibits no edema or tenderness.  Lymphadenopathy:    She has no cervical adenopathy.  Neurological: She is alert and oriented to person, place, and time. She has normal reflexes. No cranial nerve deficit. She exhibits normal muscle tone. Coordination normal.  Skin: Skin is warm. No rash noted. She is not diaphoretic. No erythema. No pallor.  Psychiatric: She has a normal mood and affect. Her behavior is normal. Judgment and thought content normal.  Vitals reviewed.         Assessment & Plan:  COPD exacerbation ([place])  Pulmonary emphysema, unspecified emphysema type ([place])  Routine general medical examination at  a health care facility  Protein-calorie malnutrition, severe ([place])  Chronic insomnia  Essential hypertension  Patient appears to be battling a COPD exacerbation.  Also there are findings concerning for underlying pulmonary fibrosis on her exam.  I will start the patient on prednisone 40 mg a day with doxycycline 100 mg p.o. twice daily for 10 days.  Discontinue Advair and replace with Trelegy 1 inhalation a day and recheck the patient in 2 weeks.  Proceed with imaging of the lungs if breathing is not improved in 2 weeks.  Immunizations are up-to-date except for the tetanus shot which the patient politely refuses.  Strongly strongly strongly recommended bisphosphonate versus Prolia to treat her severe osteoporosis.  She continues to refuse and will only use calcium and vitamin D.  Patient  declines any statins to treat her hyperlipidemia.  Blood pressures well controlled at 132/60.

## 2018-11-15 ENCOUNTER — Other Ambulatory Visit: Payer: Self-pay | Admitting: *Deleted

## 2018-11-15 MED ORDER — ALBUTEROL SULFATE HFA 108 (90 BASE) MCG/ACT IN AERS: INHALATION_SPRAY | RESPIRATORY_TRACT | 3 refills | Status: DC

## 2019-01-19 DIAGNOSIS — J441 Chronic obstructive pulmonary disease with (acute) exacerbation: Secondary | ICD-10-CM | POA: Diagnosis not present

## 2019-02-23 ENCOUNTER — Other Ambulatory Visit: Payer: Self-pay | Admitting: Family Medicine

## 2019-04-01 ENCOUNTER — Other Ambulatory Visit: Payer: Self-pay | Admitting: *Deleted

## 2019-04-01 MED ORDER — ZOLPIDEM TARTRATE 10 MG PO TABS: 10.0000 mg | ORAL_TABLET | Freq: Every evening | ORAL | 1 refills | Status: DC | PRN

## 2019-04-01 NOTE — Telephone Encounter (Signed)
Received fax requesting refill on Ambien.   Ok to refill??  Last office visit/ refill 11/12/2018.

## 2019-05-12 IMAGING — MG DIGITAL SCREENING BILATERAL MAMMOGRAM WITH TOMO AND CAD
8 series · 9 of 24 positions shown · non-contrast
Comparison: Previous exam(s).

CLINICAL DATA: Screening.

EXAM:
DIGITAL SCREENING BILATERAL MAMMOGRAM WITH TOMO AND CAD

[L MLO synth-2D]
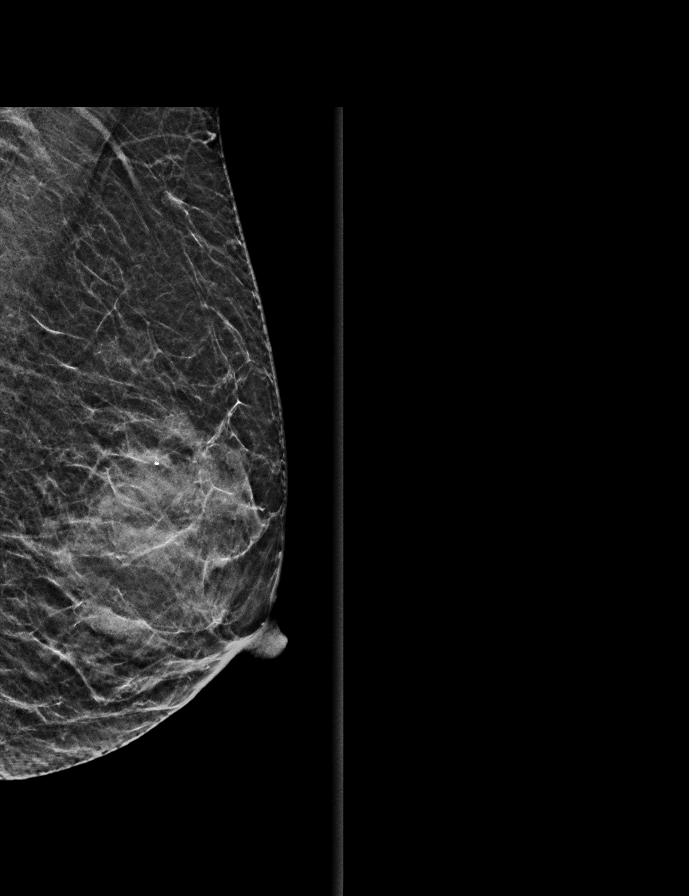

[L CC synth-2D]
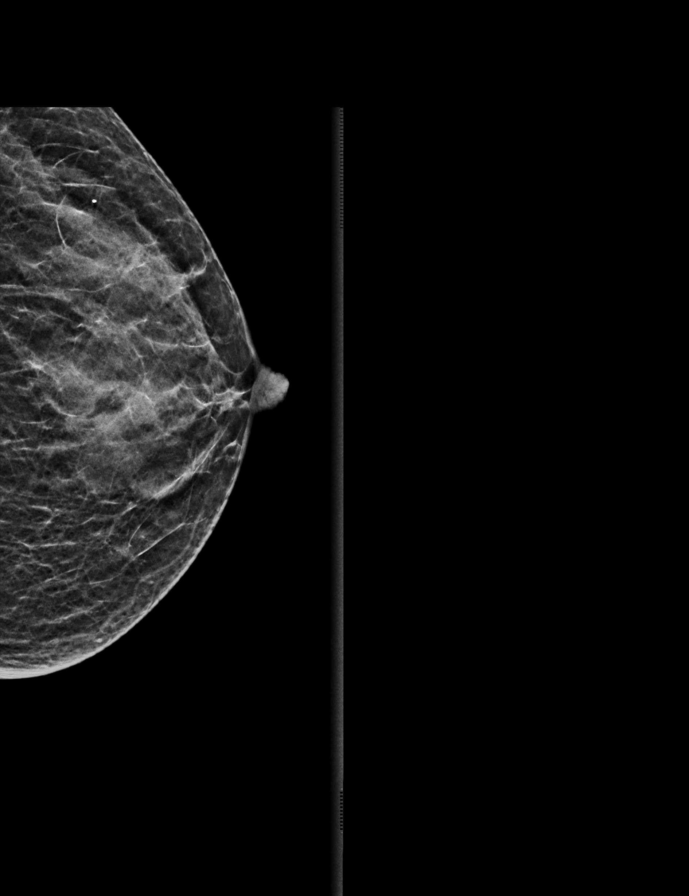

[R MLO synth-2D]
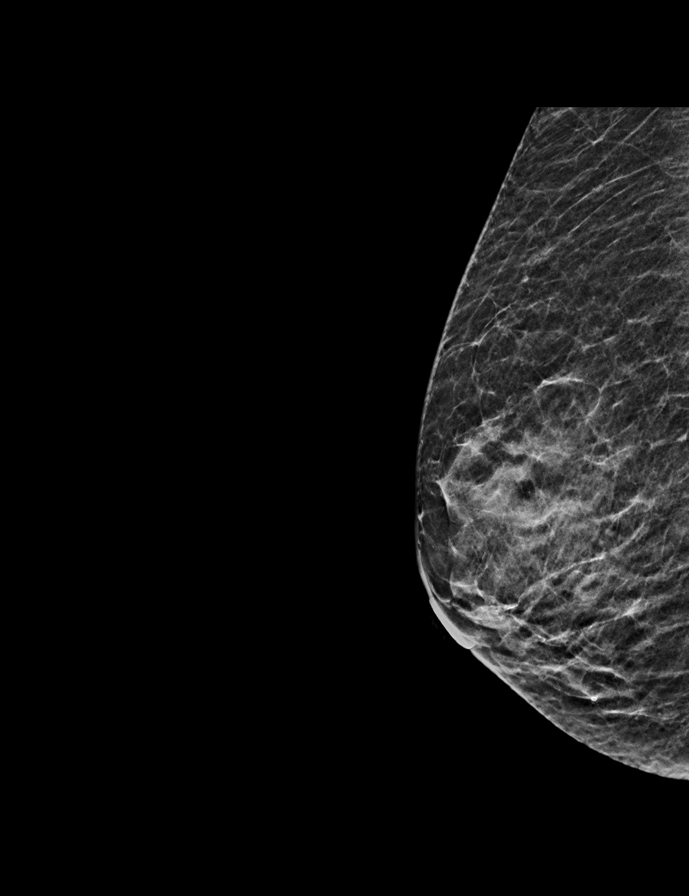

[R CC synth-2D]
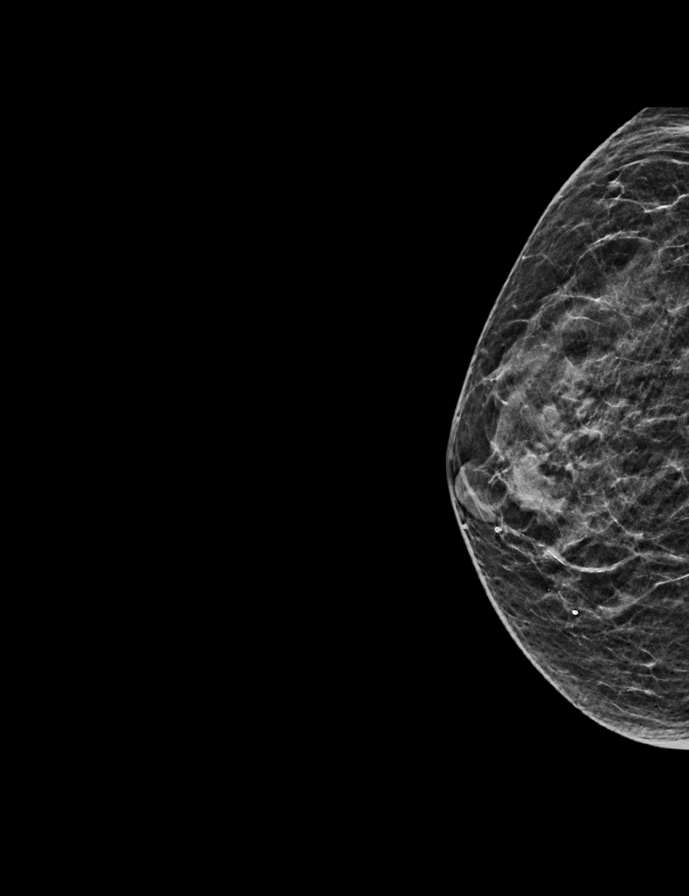

[R MLO tomo · 2 of 28 frames shown]
[frame 10/28]
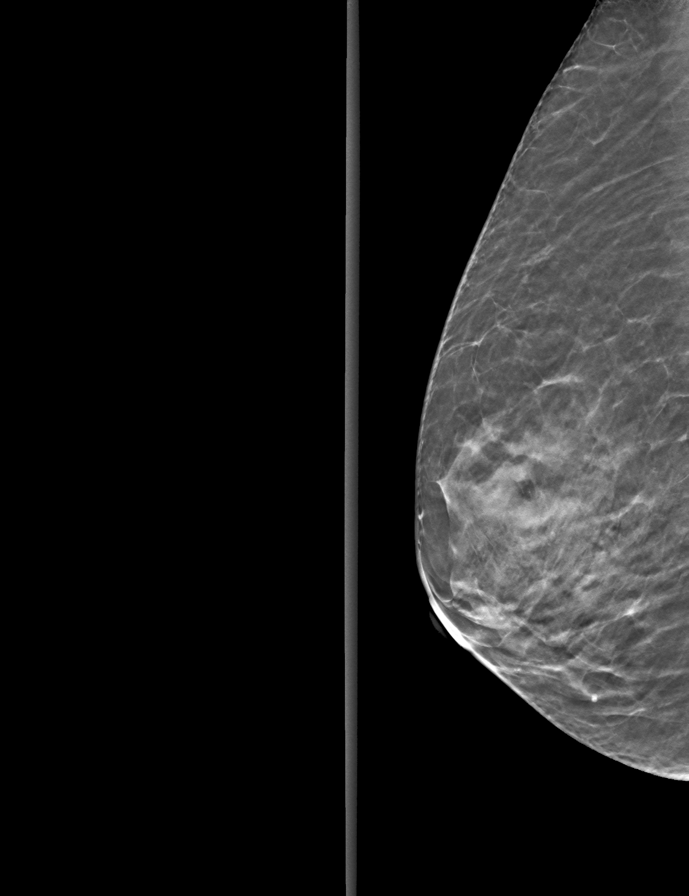
[frame 15/28]
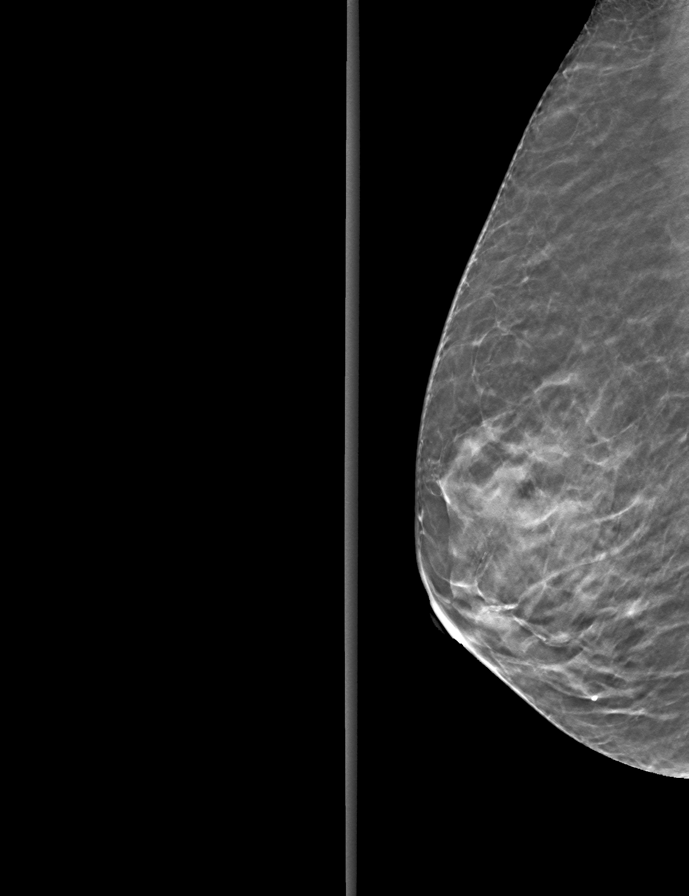

[L MLO tomo · tomo slice 15/29.0]
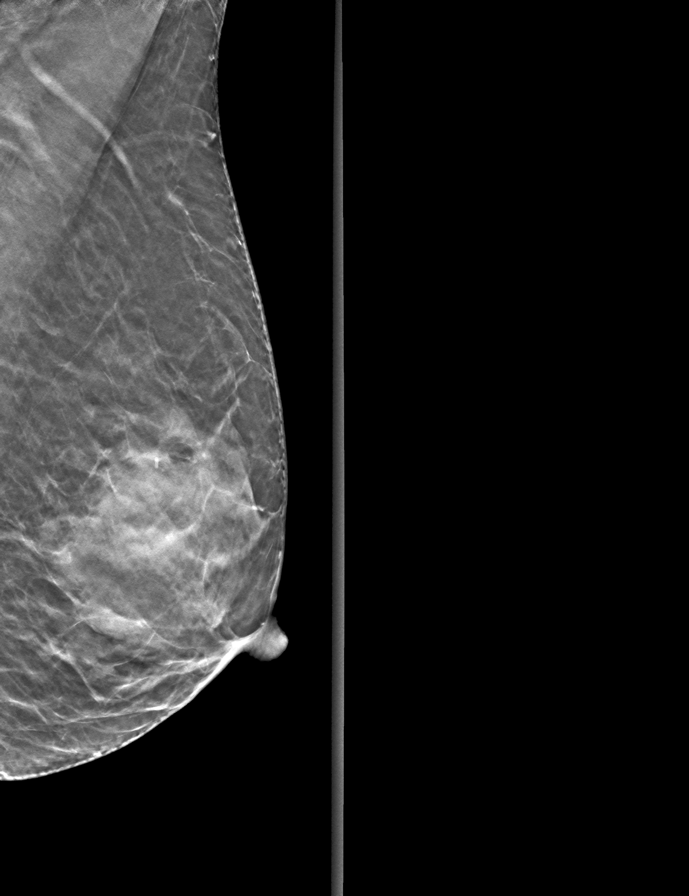

[L CC tomo · tomo slice 14/27.0]
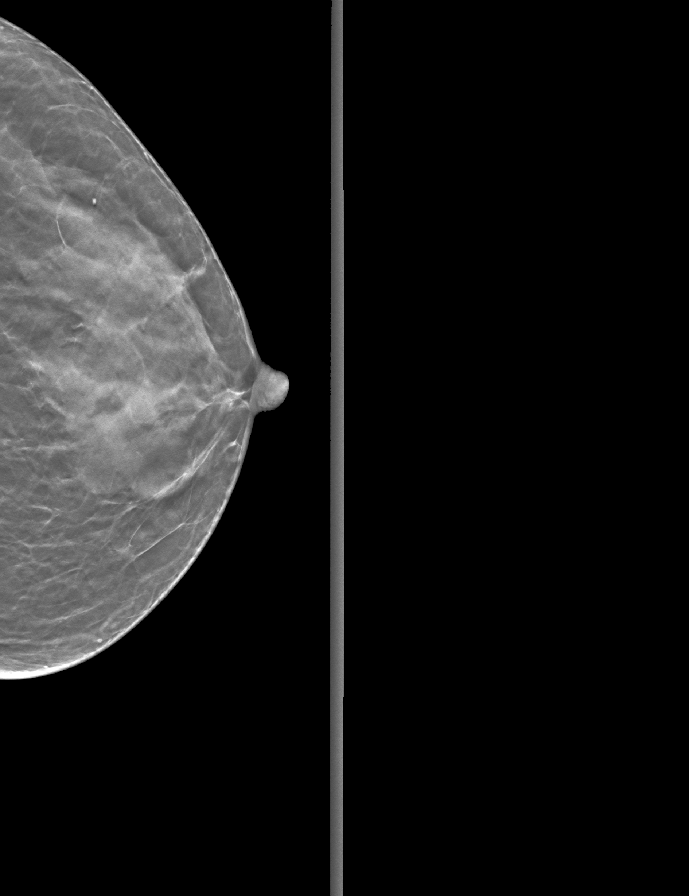

[R CC tomo · tomo slice 15/29.0]
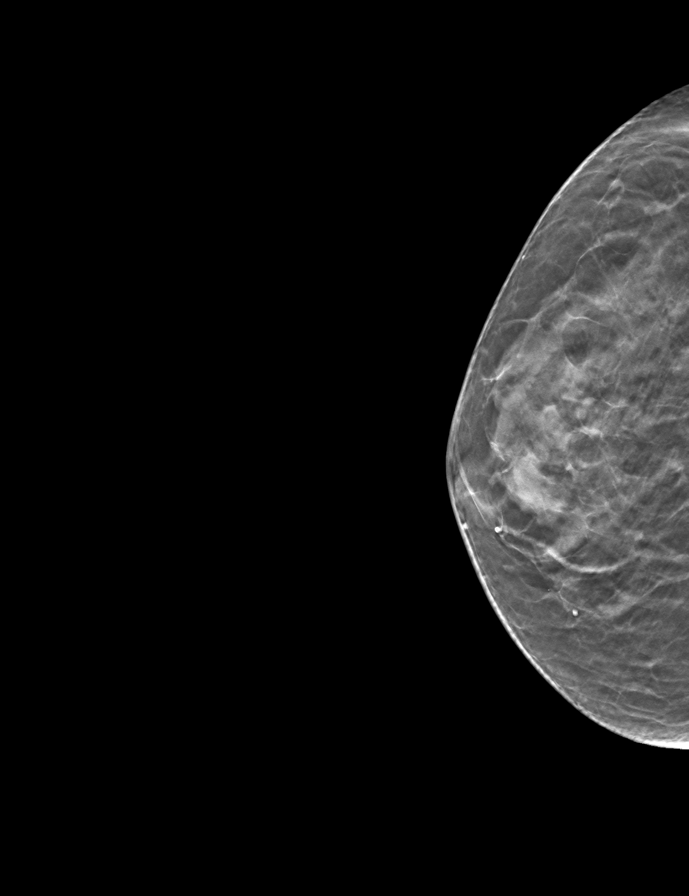

[9 of 24 positions shown; findings below may reference images not displayed]

ACR Breast Density Category c: The breast tissue is heterogeneously
dense, which may obscure small masses.
FINDINGS: There are no findings suspicious for malignancy. Images were
processed with CAD.
IMPRESSION: No mammographic evidence of malignancy. A result letter of this
screening mammogram will be mailed directly to the patient.

RECOMMENDATION:
Screening mammogram in one year. (Code:[id number])

BI-RADS CATEGORY  1: Negative.

## 2019-05-19 ENCOUNTER — Other Ambulatory Visit: Payer: Self-pay | Admitting: Family Medicine

## 2019-05-19 NOTE — Telephone Encounter (Signed)
Ok to refill??  Last office visit 11/12/2018.  Last refill 04/01/2019, #1 refills.

## 2019-06-02 IMAGING — DX DG CHEST 2V
2 series · 2 of 2 positions shown · non-contrast
Comparison: 11/08/2015

CLINICAL DATA: COPD, EMPHYSEMA, COUGH X 4 WEEKS, NOT BETTER AFTER 2
TREATMENTS IN ONE MONTH

EXAM:
CHEST - 2 VIEW

[chest pa]
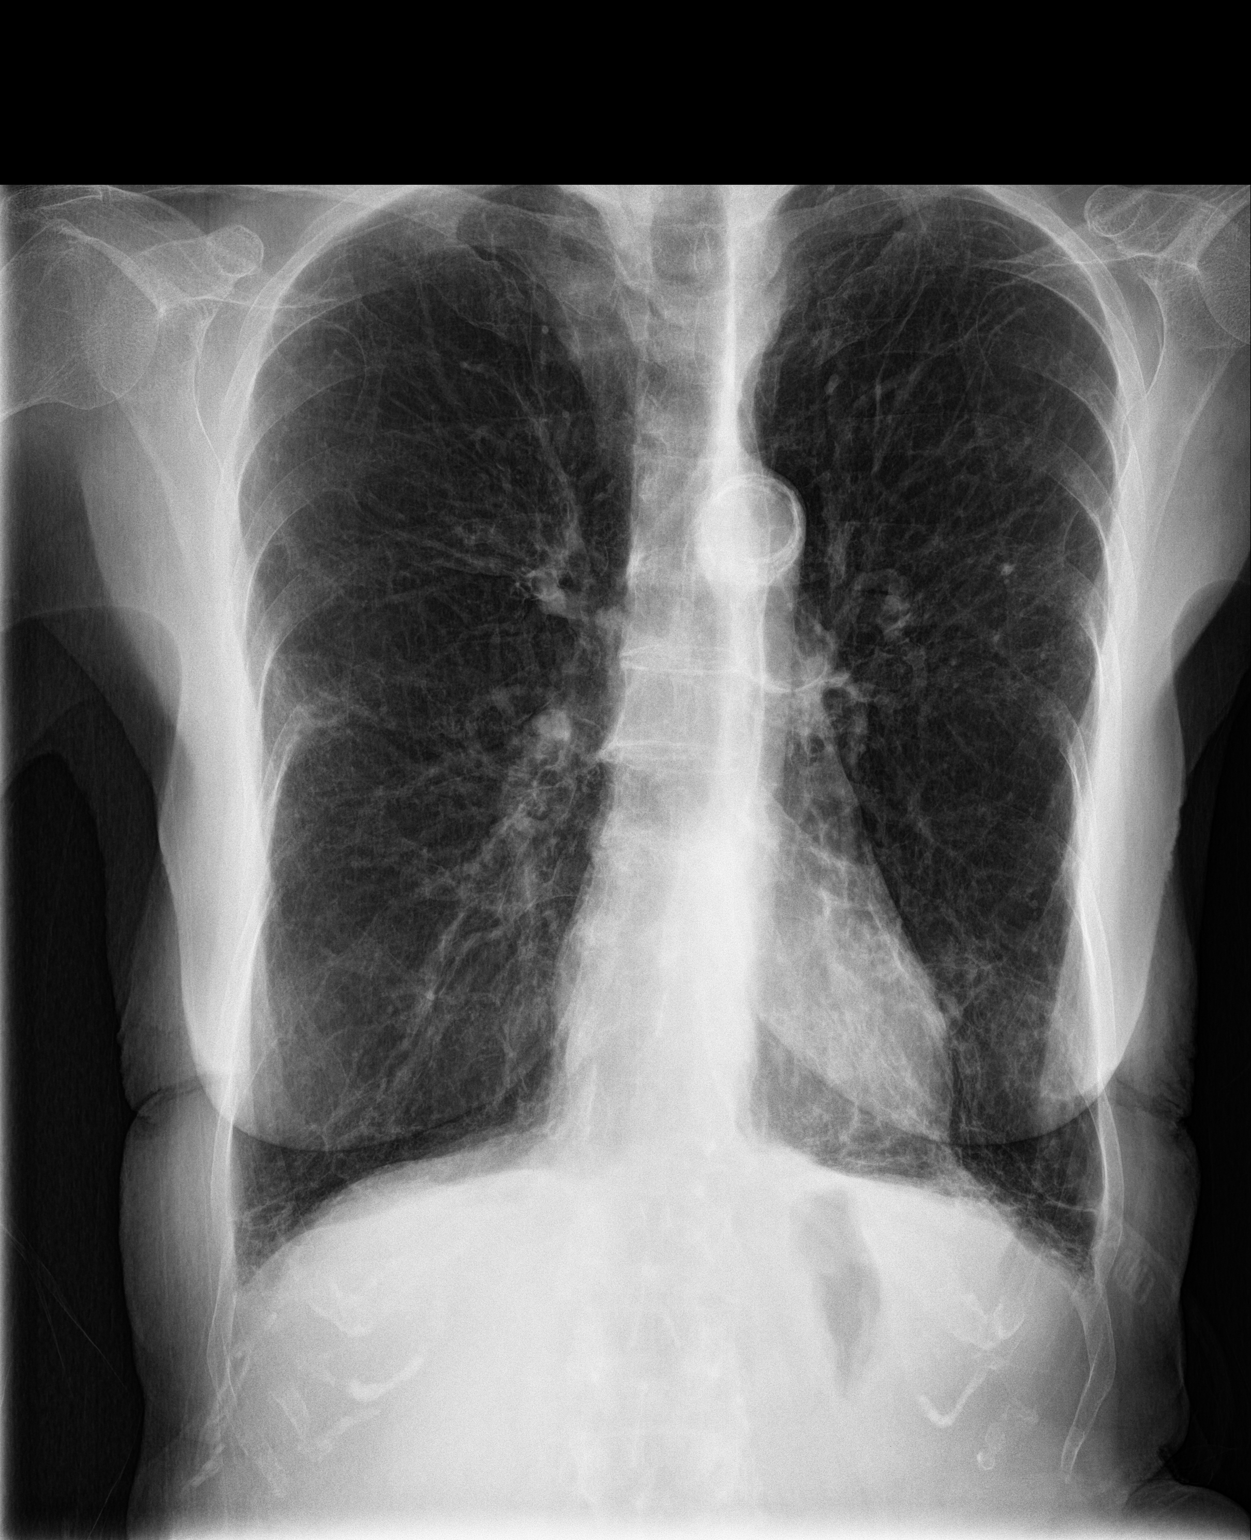

[chest lat]
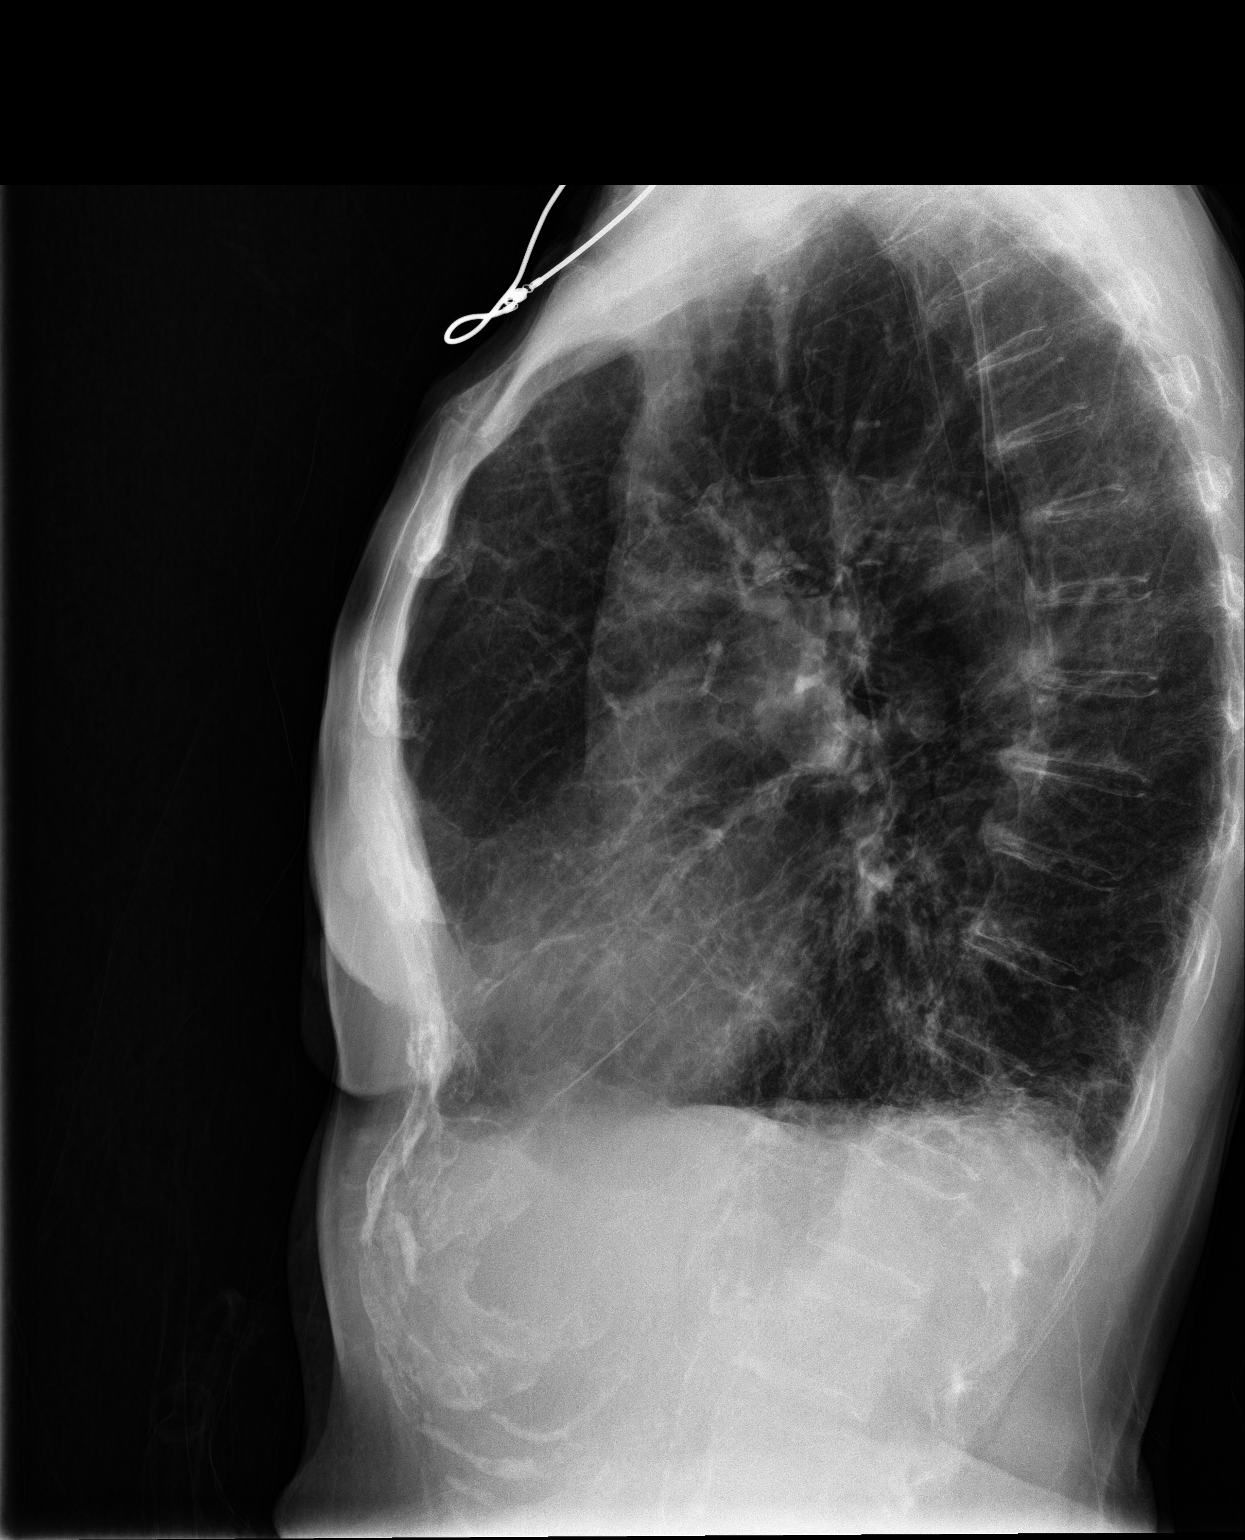

[2 of 2 positions shown; findings below may reference images not displayed]

FINDINGS: Cardiac silhouette is normal in size. No mediastinal or hilar
masses. No evidence of adenopathy.

Lungs are hyperexpanded. Chronic fibrotic changes noted at both lung
bases. No evidence of pneumonia or pulmonary edema. No pleural
effusion or pneumothorax.

Skeletal structures are demineralized but intact.
IMPRESSION: 1. No acute cardiopulmonary disease.
2. Advanced COPD with chronic fibrosis at the lung bases. Findings
stable from the prior study.

## 2019-07-14 ENCOUNTER — Other Ambulatory Visit: Payer: Self-pay | Admitting: Family Medicine

## 2019-08-09 ENCOUNTER — Other Ambulatory Visit (HOSPITAL_COMMUNITY): Payer: Self-pay | Admitting: Family Medicine

## 2019-08-09 DIAGNOSIS — Z1231 Encounter for screening mammogram for malignant neoplasm of breast: Secondary | ICD-10-CM

## 2019-09-02 ENCOUNTER — Other Ambulatory Visit: Payer: Self-pay | Admitting: Family Medicine

## 2019-09-02 NOTE — Telephone Encounter (Signed)
Ok to refill??  Last office visit 11/12/2018.  Last refill 05/19/2019.

## 2019-09-05 ENCOUNTER — Other Ambulatory Visit: Payer: Self-pay

## 2019-09-05 ENCOUNTER — Ambulatory Visit (HOSPITAL_COMMUNITY)
Admission: RE | Admit: 2019-09-05 | Discharge: 2019-09-05 | Disposition: A | Discharge status: Home / Self Care | Payer: Medicare Other | Source: Ambulatory Visit | Attending: Family Medicine | Admitting: Family Medicine

## 2019-09-05 DIAGNOSIS — Z1231 Encounter for screening mammogram for malignant neoplasm of breast: Secondary | ICD-10-CM | POA: Diagnosis not present

## 2019-09-07 ENCOUNTER — Ambulatory Visit (INDEPENDENT_AMBULATORY_CARE_PROVIDER_SITE_OTHER): Payer: Medicare Other | Admitting: Family Medicine

## 2019-09-07 ENCOUNTER — Other Ambulatory Visit: Payer: Self-pay

## 2019-09-07 DIAGNOSIS — Z23 Encounter for immunization: Secondary | ICD-10-CM

## 2019-09-07 MED ORDER — ZOSTER VAC RECOMB ADJUVANTED 50 MCG/0.5ML IM SUSR: 0.5000 mL | Freq: Once | INTRAMUSCULAR | 1 refills | Status: AC

## 2019-09-28 ENCOUNTER — Other Ambulatory Visit: Payer: Self-pay | Admitting: Family Medicine

## 2019-10-19 ENCOUNTER — Other Ambulatory Visit: Payer: Self-pay | Admitting: Family Medicine

## 2019-10-19 NOTE — Telephone Encounter (Signed)
Requested Prescriptions   Pending Prescriptions Disp Refills  . zolpidem (AMBIEN) 10 MG tablet [Pharmacy Med Name: ZOLPIDEM TARTRATE 10 MG TABLET] 30 tablet 0    Sig: TAKE ONE TABLET BY MOUTH AT BEDTIME AS NEEDED.    Last OV 11/12/2018  Last written 09/02/2019

## 2019-10-19 NOTE — Telephone Encounter (Signed)
Please schedule OV, pt needs labs done as well  We can refill her meds only 1 more time

## 2019-10-19 NOTE — Telephone Encounter (Signed)
lvmtrc  

## 2019-11-02 DIAGNOSIS — N390 Urinary tract infection, site not specified: Secondary | ICD-10-CM | POA: Diagnosis not present

## 2019-11-09 ENCOUNTER — Other Ambulatory Visit: Payer: Medicare Other

## 2019-11-10 ENCOUNTER — Other Ambulatory Visit: Payer: Self-pay

## 2019-11-10 ENCOUNTER — Ambulatory Visit (INDEPENDENT_AMBULATORY_CARE_PROVIDER_SITE_OTHER): Payer: Medicare Other | Admitting: Family Medicine

## 2019-11-10 ENCOUNTER — Encounter: Payer: Self-pay | Admitting: Family Medicine

## 2019-11-10 VITALS — BP 160/72 | HR 88 | Temp 97.8°F | Resp 20 | Ht 63.0 in | Wt 106.0 lb

## 2019-11-10 DIAGNOSIS — F5104 Psychophysiologic insomnia: Secondary | ICD-10-CM | POA: Diagnosis not present

## 2019-11-10 DIAGNOSIS — R300 Dysuria: Secondary | ICD-10-CM | POA: Diagnosis not present

## 2019-11-10 DIAGNOSIS — I10 Essential (primary) hypertension: Secondary | ICD-10-CM

## 2019-11-10 LAB — URINALYSIS, ROUTINE W REFLEX MICROSCOPIC
Bacteria, UA: NONE SEEN /HPF
Bilirubin Urine: NEGATIVE
Glucose, UA: NEGATIVE
Hyaline Cast: NONE SEEN /LPF
Ketones, ur: NEGATIVE
Nitrite: NEGATIVE
Protein, ur: NEGATIVE
Specific Gravity, Urine: 1.02 (ref 1.001–1.03)
pH: 7 (ref 5.0–8.0)

## 2019-11-10 LAB — MICROSCOPIC MESSAGE

## 2019-11-10 MED ORDER — IRBESARTAN-HYDROCHLOROTHIAZIDE 150-12.5 MG PO TABS: 0.5000 | ORAL_TABLET | Freq: Every day | ORAL | 2 refills | Status: DC

## 2019-11-10 MED ORDER — ZOLPIDEM TARTRATE 10 MG PO TABS: 10.0000 mg | ORAL_TABLET | Freq: Every evening | ORAL | 2 refills | Status: DC | PRN

## 2019-11-10 MED ORDER — ALBUTEROL SULFATE HFA 108 (90 BASE) MCG/ACT IN AERS: INHALATION_SPRAY | RESPIRATORY_TRACT | 6 refills | Status: AC

## 2019-11-10 MED ORDER — CIPROFLOXACIN HCL 500 MG PO TABS: 500.0000 mg | ORAL_TABLET | Freq: Two times a day (BID) | ORAL | 0 refills | Status: DC

## 2019-11-10 MED ORDER — IRBESARTAN-HYDROCHLOROTHIAZIDE 150-12.5 MG PO TABS: 1.0000 | ORAL_TABLET | Freq: Every day | ORAL | 2 refills | Status: DC

## 2019-11-10 NOTE — Progress Notes (Signed)
Subjective:    Patient ID: [name], female    DOB: December 27, 1940, 78 y.o.   MRN: [id number]  Hypertension  Medication Refill    10/2017 Patient is here today for complete physical exam.  Mammogram was performed in September and was normal. Her last colonoscopy was in 2013 and is not due again until 2023. Due to a she does not require a Pap smear. Her bone density is significant for osteoporosis however she is failed 3 different bisphosphonates as well as prolia and refuses to try any other medications.  I am thoroughly confused regarding her blood pressure medication.  Last year I had her on valsartan hydrochlorothiazide.  She discontinue this medication when she saw my partner this summer due to low blood pressure.  She began taking her husband's amlodipine.  We then prescribed amlodipine for her however later that summer she discontinued amlodipine and hydrochlorothiazide due to cough and polyuria.   we then placed her on losartan according to our last phone note.  She is supposed to be on 50 mg of losartan a day.  However she states that she has been taking valsartan hydrochlorothiazide 160/12.5 1/2 tablet daily over the last month which she independently started taking on her own due to high blood pressure however she wants to stop this 1 now because of polyuria.  I explained to the patient that I am confused.  I also explained to the patient that she is eventually going to hurt herself by starting and stopping numerous medications independently without consultation with a physician.  She also runs high risk of inadvertently taking duplicates of medications.  I thought she was taking losartan when she had resumed her valsartan hydrochlorothiazide which was an old prescription that she had at home from last August.  I was very clear with the patient that she must stop doing this.  She also tends to discontinue medication due to "side effects" that occur when she takes medication.  She thinks that  over-the-counter vitamin D causes her to have a cough but prescription vitamin D does not.  She felt bloated pain was causing a cough.  She feels bad taking every bisphosphonate and statin medication.  Many of the side effects are likely due to other problems or underlying medical conditions and not the medications themselves yet the patient attributes them to the medication and will frequently discontinue them independently.  I tried to explain the danger of doing this.  At that time, my plan was: I will schedule the patient for a bone density to monitor her osteoporosis.  Her mammogram is up-to-date.  She does not require a Pap smear.  She does not require colonoscopy.  Immunizations are up-to-date except the shingles vaccine which she refuses due to cost.  I was adamant with the patient that she must stop switching medication particularly doing it on her own without consultation.  She is inadvertently going to hurt herself.  We will start the patient today on losartan 50 mg a day and discontinue any other antihypertensive that she is taking.  I personally threw away the Diovan HCTZ that she has been taking the last 3 weeks.  I want to recheck her blood pressure in 1 month to determine what if any changes need to be made.  Overall the remainder of her lab work looks excellent.  Reassess blood pressure in 1 month.  12/18/17 The patient stopped her losartan due to polyuria.  I explained to the patient that I did not  believe losartan is causing her polyuria however she discontinued the medication independently anyway. As a result her blood pressure is 180/70 however the polyuria that she reported has subsided. She wants to switch to amlodipine because her husband takes that and it doesn't cause him any problems. I explained to the patient that she is tried that in the past and she discontinued it because she thought that it was causing her to have a cough.  At that time, my plan was: Patient is noncompliant with  medication. She often attributes any symptoms she has the medicine she is taking. She continues to discontinue blood pressure pills. As result her blood pressure today is uncontrolled. I will switch the patient back to amlodipine 10 mg a day and recheck her blood pressure in 3 weeks. I'm concerned that she will likely discontinue this medication in the future and I cautioned her against this.  04/27/18 Patient is currently taking amlodipine 5 mg a day.  Her blood pressure here is well controlled at 124/62.  She states that her blood pressure at home is also well controlled in the 120s over 80s.  She would prefer to have a 5 mg pill as opposed to break a 10 mg pill in half.  She denies any chest pain.  She reports her chronic shortness of breath.  She has been switched to the generic Advair from name brand Advair and she does not believe that it is working as well.  I try to explain to the patient that the medication is equivalent however she respectfully request that she be switched back to name brand Advair as she is convinced that it works better for her breathing.  She is lost 2 pounds since her last office visit and I do worry about protein calorie malnutrition given her age and frail stature.  At that time, my plan was: Blood pressures well controlled.  Continue amlodipine 5 mg a day.  Recheck CBC, CMP, fasting lipid panel.  I wrote the patient a prescription for Advair 250/50 1 inhalation twice daily name brand only.  I recommended that the patient start drinking an Ensure 1 can a day for early signs of protein calorie malnutrition.  11/10/19 Please see above, the patient has had multiple issues taking blood pressure medications in the past.  Today her blood pressure is elevated 160/72.  She stopped taking her amlodipine 5 mg a day 3 months ago because "it was making her urinate too much".  A member of her church informed her that irbesartan hydrochlorothiazide would not cause her to urinate as much.  The  patient would like to take 300/12.5 mg of that.  I am concerned about the massive dose.  I am also concerned that there is a diuretic and that and if she was experiencing increased diuresis on a calcium channel blocker which should not do that, I tried to explain the patient that there is a high likelihood that she would experience polyuria on hydrochlorothiazide however she is convinced that she would like to try that 1.  She also has a bladder infection.  She was treated last week at a clinic in [facility] with [place] for 7 days however she continues to have persistent dysuria and urgency and frequency.  Urinalysis today shows +3 leukocyte esterase as well as hematuria consistent with a persistent urinary tract infection.  Past Medical History:  Diagnosis Date   BV (bacterial vaginosis)    Chronic UTI    COPD (chronic obstructive pulmonary disease) (  [place])    FH: cataracts    Hyperlipidemia    Osteoporosis    Past Surgical History:  Procedure Laterality Date   COLONOSCOPY  04/19/2012   Procedure: COLONOSCOPY;  Surgeon: Danie Binder, MD;  Location[facility];  Service: Endoscopy;  Laterality: N/A;  11:30 AM   FRACTURE SURGERY     rt left knee cap   Current Outpatient Medications on File Prior to Visit  Medication Sig Dispense Refill   ADVAIR DISKUS 250-50 MCG/DOSE AEPB INHALE ONE PUFF INTO THE LUNGS TWICE DAILY. 60 each 5   Calcium Carbonate-Vit D-Min (CALCIUM 1200 PO) Take by mouth.     Cholecalciferol (VITAMIN D) 2000 units CAPS Take by mouth.     zolpidem (AMBIEN) 10 MG tablet TAKE ONE TABLET BY MOUTH AT BEDTIME AS NEEDED. 30 tablet 0   No current facility-administered medications on file prior to visit.    Allergies  Allergen Reactions   Naproxen Sodium Nausea And Vomiting   Symbicort [Budesonide-Formoterol Fumarate] Shortness Of Breath    Breathing difficulty   Alendronate Sodium Other (See Comments)    Kidney failure   Boniva [Ibandronate Sodium] Other  (See Comments)    Kidney failure    Risedronate Sodium    Zetia [Ezetimibe] Other (See Comments)    Muscle pain   Social History   Socioeconomic History   Marital status: Married    Spouse name: Not on file   Number of children: Not on file   Years of education: Not on file   Highest education level: Not on file  Occupational History   Not on file  Social Needs   Financial resource strain: Not on file   Food insecurity    Worry: Not on file    Inability: Not on file   Transportation needs    Medical: Not on file    Non-medical: Not on file  Tobacco Use   Smoking status: Former Smoker   Smokeless tobacco: Never Used  Substance and Sexual Activity   Alcohol use: No   Drug use: No   Sexual activity: Not on file    Comment: married, husband smokes.  Lifestyle   Physical activity    Days per week: Not on file    Minutes per session: Not on file   Stress: Not on file  Relationships   Social connections    Talks on phone: Not on file    Gets together: Not on file    Attends religious service: Not on file    Active member of club or organization: Not on file    Attends meetings of clubs or organizations: Not on file    Relationship status: Not on file   Intimate partner violence    Fear of current or ex partner: Not on file    Emotionally abused: Not on file    Physically abused: Not on file    Forced sexual activity: Not on file  Other Topics Concern   Not on file  Social History Narrative   Not on file   Family History  Problem Relation Age of Onset   Cancer Father        pancreatic      Review of Systems  All other systems reviewed and are negative.      Objective:   Physical Exam  Constitutional: She is oriented to person, place, and time. She appears well-developed and well-nourished. No distress.  HENT:  Head: Normocephalic and atraumatic.  Right Ear: External ear  normal.  Left Ear: External ear normal.  Nose: Nose  normal.  Mouth/Throat: Oropharynx is clear and moist. No oropharyngeal exudate.  Eyes: Pupils are equal, round, and reactive to light. Conjunctivae and EOM are normal. Right eye exhibits no discharge. Left eye exhibits no discharge. No scleral icterus.  Neck: Normal range of motion. Neck supple. No JVD present. No tracheal deviation present. No thyromegaly present.  Cardiovascular: Normal rate, regular rhythm, normal heart sounds and intact distal pulses. Exam reveals no gallop and no friction rub.  No murmur heard. Pulmonary/Chest: No accessory muscle usage or stridor. No respiratory distress. She has no decreased breath sounds. She has wheezes. She has no rhonchi. She has no rales.  Abdominal: Soft. Bowel sounds are normal. She exhibits no distension and no mass. There is no abdominal tenderness. There is no rebound and no guarding.  Musculoskeletal: Normal range of motion.        General: No tenderness or edema.  Lymphadenopathy:    She has no cervical adenopathy.  Neurological: She is alert and oriented to person, place, and time. She has normal reflexes. No cranial nerve deficit. She exhibits normal muscle tone. Coordination normal.  Skin: Skin is warm. No rash noted. She is not diaphoretic. No erythema. No pallor.  Psychiatric: She has a normal mood and affect. Her behavior is normal. Judgment and thought content normal.  Vitals reviewed.         Assessment & Plan:  Dysuria - Plan: Urinalysis, Routine w reflex microscopic  Essential hypertension  Chronic insomnia  Urinalysis suggest persistent urinary tract infection likely resistant to Macrobid.  Switch to Cipro 500 mg p.o. twice daily for 7 days as she has tolerated this medication well in the past successfully.  Regarding her essential hypertension we will discontinue amlodipine and start the patient on Avalide 50/12.5, 1/2 tablet once a day and then increase if necessary.  Regarding her chronic insomnia I will refill her  Ambien.

## 2019-11-14 ENCOUNTER — Encounter: Payer: Medicare Other | Admitting: Family Medicine

## 2020-01-06 ENCOUNTER — Other Ambulatory Visit: Payer: Self-pay

## 2020-01-06 ENCOUNTER — Other Ambulatory Visit: Payer: Medicare Other

## 2020-01-06 DIAGNOSIS — I10 Essential (primary) hypertension: Secondary | ICD-10-CM | POA: Diagnosis not present

## 2020-01-06 DIAGNOSIS — J449 Chronic obstructive pulmonary disease, unspecified: Secondary | ICD-10-CM | POA: Diagnosis not present

## 2020-01-06 DIAGNOSIS — Z0000 Encounter for general adult medical examination without abnormal findings: Secondary | ICD-10-CM

## 2020-01-06 DIAGNOSIS — Z13220 Encounter for screening for lipoid disorders: Secondary | ICD-10-CM | POA: Diagnosis not present

## 2020-01-07 LAB — CBC WITH DIFFERENTIAL/PLATELET
Absolute Monocytes: 896 cells/uL (ref 200–950)
Basophils Absolute: 146 cells/uL (ref 0–200)
Basophils Relative: 1.3 %
Eosinophils Absolute: 325 cells/uL (ref 15–500)
Eosinophils Relative: 2.9 %
HCT: 44.8 % (ref 35.0–45.0)
Hemoglobin: 15.6 g/dL — ABNORMAL HIGH (ref 11.7–15.5)
Lymphs Abs: 2632 cells/uL (ref 850–3900)
MCH: 33.8 pg — ABNORMAL HIGH (ref 27.0–33.0)
MCHC: 34.8 g/dL (ref 32.0–36.0)
MCV: 97 fL (ref 80.0–100.0)
MPV: 10.8 fL (ref 7.5–12.5)
Monocytes Relative: 8 %
Neutro Abs: 7202 cells/uL (ref 1500–7800)
Neutrophils Relative %: 64.3 %
Platelets: 398 10*3/uL (ref 140–400)
RBC: 4.62 10*6/uL (ref 3.80–5.10)
RDW: 13.3 % (ref 11.0–15.0)
Total Lymphocyte: 23.5 %
WBC: 11.2 10*3/uL — ABNORMAL HIGH (ref 3.8–10.8)

## 2020-01-07 LAB — COMPREHENSIVE METABOLIC PANEL
AG Ratio: 1.1 (calc) (ref 1.0–2.5)
ALT: 25 U/L (ref 6–29)
AST: 36 U/L — ABNORMAL HIGH (ref 10–35)
Albumin: 3.6 g/dL (ref 3.6–5.1)
Alkaline phosphatase (APISO): 143 U/L (ref 37–153)
BUN/Creatinine Ratio: 48 (calc) — ABNORMAL HIGH (ref 6–22)
BUN: 26 mg/dL — ABNORMAL HIGH (ref 7–25)
CO2: 27 mmol/L (ref 20–32)
Calcium: 9.4 mg/dL (ref 8.6–10.4)
Chloride: 102 mmol/L (ref 98–110)
Creat: 0.54 mg/dL — ABNORMAL LOW (ref 0.60–0.93)
Globulin: 3.2 g/dL (calc) (ref 1.9–3.7)
Glucose, Bld: 91 mg/dL (ref 65–99)
Potassium: 4.1 mmol/L (ref 3.5–5.3)
Sodium: 139 mmol/L (ref 135–146)
Total Bilirubin: 0.9 mg/dL (ref 0.2–1.2)
Total Protein: 6.8 g/dL (ref 6.1–8.1)

## 2020-01-07 LAB — LIPID PANEL
Cholesterol: 202 mg/dL — ABNORMAL HIGH (ref <200)
HDL: 61 mg/dL (ref >=50)
LDL Cholesterol (Calc): 127 mg/dL (calc) — ABNORMAL HIGH
Non-HDL Cholesterol (Calc): 141 mg/dL (calc) — ABNORMAL HIGH (ref <130)
Total CHOL/HDL Ratio: 3.3 (calc) (ref <5.0)
Triglycerides: 52 mg/dL (ref <150)

## 2020-01-09 ENCOUNTER — Other Ambulatory Visit: Payer: Self-pay

## 2020-01-09 ENCOUNTER — Encounter: Payer: Self-pay | Admitting: Family Medicine

## 2020-01-09 ENCOUNTER — Ambulatory Visit (INDEPENDENT_AMBULATORY_CARE_PROVIDER_SITE_OTHER): Payer: Medicare Other | Admitting: Family Medicine

## 2020-01-09 VITALS — BP 140/62 | HR 128 | Temp 98.0°F | Resp 20 | Ht 63.0 in | Wt 107.0 lb

## 2020-01-09 DIAGNOSIS — E43 Unspecified severe protein-calorie malnutrition: Secondary | ICD-10-CM

## 2020-01-09 DIAGNOSIS — J439 Emphysema, unspecified: Secondary | ICD-10-CM

## 2020-01-09 DIAGNOSIS — M810 Age-related osteoporosis without current pathological fracture: Secondary | ICD-10-CM

## 2020-01-09 DIAGNOSIS — Z0000 Encounter for general adult medical examination without abnormal findings: Secondary | ICD-10-CM

## 2020-01-09 DIAGNOSIS — I10 Essential (primary) hypertension: Secondary | ICD-10-CM

## 2020-01-09 DIAGNOSIS — F5104 Psychophysiologic insomnia: Secondary | ICD-10-CM | POA: Diagnosis not present

## 2020-01-09 DIAGNOSIS — E7800 Pure hypercholesterolemia, unspecified: Secondary | ICD-10-CM

## 2020-01-09 NOTE — Progress Notes (Signed)
Subjective:    Patient ID: [name], female    DOB: 04/29/1941, 79 y.o.   MRN: [id number]  HPI Patient is here today for complete physical exam.  Mammogram was performed in September and was normal. Her last colonoscopy was in 2013 and is not due again until 2023. Due to a she does not require a Pap smear. Her bone density is significant for osteoporosis however she is failed 3 different bisphosphonates as well as prolia and refuses to try any other medications.  We discussed this again today at length.  She had no life-threatening side effects from the bisphosphonates.  It lists "kidney failure" as the side effect however she did not experience kidney failure due to these medications.  Instead she attributed them to causing a bladder infection.  She also believe that Avapro/hydrochlorothiazide was causing a bladder infection and that is why she recently discontinued that.  She is taking amlodipine 5 mg a day and her blood pressure today is acceptable at 140/62.  She denies any chest pain shortness of breath or dyspnea on exertion.  She denies any falls.  She denies any memory loss.  She denies any anxiety.  She denies any depression. Lab on 01/06/2020  Component Date Value Ref Range Status  . Cholesterol 01/06/2020 202* <200 mg/dL Final  . HDL 01/06/2020 61  > OR = 50 mg/dL Final  . Triglycerides 01/06/2020 52  <150 mg/dL Final  . LDL Cholesterol (Calc) 01/06/2020 127* mg/dL (calc) Final   Comment: Reference range: <100 . Desirable range <100 mg/dL for primary prevention;   <70 mg/dL for patients with CHD or diabetic patients  with > or = 2 CHD risk factors. Marland Kitchen LDL-C is now calculated using the Martin-Hopkins  calculation, which is a validated novel method providing  better accuracy than the Friedewald equation in the  estimation of LDL-C.  Cresenciano Genre et al. Annamaria Helling. [id number]): 2061-2068  (http://education.[facility].com/faq/FAQ164)   . Total CHOL/HDL Ratio 01/06/2020 3.3  <5.0  (calc) Final  . Non-HDL Cholesterol (Calc) 01/06/2020 141* <130 mg/dL (calc) Final   Comment: For patients with diabetes plus 1 major ASCVD risk  factor, treating to a non-HDL-C goal of <100 mg/dL  (LDL-C of <70 mg/dL) is considered a therapeutic  option.   . Glucose, Bld 01/06/2020 91  65 - 99 mg/dL Final   Comment: .            Fasting reference interval .   . BUN 01/06/2020 26* 7 - 25 mg/dL Final  . Creat 01/06/2020 0.54* 0.60 - 0.93 mg/dL Final   Comment: For patients >6 years of age, the reference limit for Creatinine is approximately 13% higher for people identified as African-American. .   Havery Moros Ratio 01/06/2020 48* 6 - 22 (calc) Final  . Sodium 01/06/2020 139  135 - 146 mmol/L Final  . Potassium 01/06/2020 4.1  3.5 - 5.3 mmol/L Final  . Chloride 01/06/2020 102  98 - 110 mmol/L Final  . CO2 01/06/2020 27  20 - 32 mmol/L Final  . Calcium 01/06/2020 9.4  8.6 - 10.4 mg/dL Final  . Total Protein 01/06/2020 6.8  6.1 - 8.1 g/dL Final  . Albumin 01/06/2020 3.6  3.6 - 5.1 g/dL Final  . Globulin 01/06/2020 3.2  1.9 - 3.7 g/dL (calc) Final  . AG Ratio 01/06/2020 1.1  1.0 - 2.5 (calc) Final  . Total Bilirubin 01/06/2020 0.9  0.2 - 1.2 mg/dL Final  . Alkaline phosphatase (APISO) 01/06/2020 143  37 -  153 U/L Final  . AST 01/06/2020 36* 10 - 35 U/L Final  . ALT 01/06/2020 25  6 - 29 U/L Final  . WBC 01/06/2020 11.2* 3.8 - 10.8 Thousand/uL Final  . RBC 01/06/2020 4.62  3.80 - 5.10 Million/uL Final  . Hemoglobin 01/06/2020 15.6* 11.7 - 15.5 g/dL Final  . HCT 01/06/2020 44.8  35.0 - 45.0 % Final  . MCV 01/06/2020 97.0  80.0 - 100.0 fL Final  . MCH 01/06/2020 33.8* 27.0 - 33.0 pg Final  . MCHC 01/06/2020 34.8  32.0 - 36.0 g/dL Final  . RDW 01/06/2020 13.3  11.0 - 15.0 % Final  . Platelets 01/06/2020 398  140 - 400 Thousand/uL Final  . MPV 01/06/2020 10.8  7.5 - 12.5 fL Final  . Neutro Abs 01/06/2020 7,202  1,500 - 7,800 cells/uL Final  . Lymphs Abs 01/06/2020 2,632  850 -  3,900 cells/uL Final  . Absolute Monocytes 01/06/2020 896  200 - 950 cells/uL Final  . Eosinophils Absolute 01/06/2020 325  15 - 500 cells/uL Final  . Basophils Absolute 01/06/2020 146  0 - 200 cells/uL Final  . Neutrophils Relative % 01/06/2020 64.3  % Final  . Total Lymphocyte 01/06/2020 23.5  % Final  . Monocytes Relative 01/06/2020 8.0  % Final  . Eosinophils Relative 01/06/2020 2.9  % Final  . Basophils Relative 01/06/2020 1.3  % Final   Past Medical History:  Diagnosis Date  . BV (bacterial vaginosis)   . Chronic UTI   . COPD (chronic obstructive pulmonary disease) ([place])   . FH: cataracts   . Hyperlipidemia   . Osteoporosis    Past Surgical History:  Procedure Laterality Date  . COLONOSCOPY  04/19/2012   Procedure: COLONOSCOPY;  Surgeon: Danie Binder, MD;  Location: [facility];  Service: Endoscopy;  Laterality: N/A;  11:30 AM  . FRACTURE SURGERY     rt left knee cap   Current Outpatient Medications on File Prior to Visit  Medication Sig Dispense Refill  . ADVAIR DISKUS 250-50 MCG/DOSE AEPB INHALE ONE PUFF INTO THE LUNGS TWICE DAILY. 60 each 5  . albuterol (PROAIR HFA) 108 (90 Base) MCG/ACT inhaler INHALE 2 PUFFS INTO THE LUNGS EVERY 6 HOURS AS NEEDED FOR WHEEZING/SHORTNESS OF BREATH. 8.5 g 6  . amLODipine (NORVASC) 5 MG tablet Take 5 mg by mouth daily.    . Calcium Carbonate-Vit D-Min (CALCIUM 1200 PO) Take by mouth.    . Cholecalciferol (VITAMIN D) 2000 units CAPS Take by mouth.    . zolpidem (AMBIEN) 10 MG tablet Take 1 tablet (10 mg total) by mouth at bedtime as needed. 30 tablet 2   No current facility-administered medications on file prior to visit.   Allergies  Allergen Reactions  . Naproxen Sodium Nausea And Vomiting  . Symbicort [Budesonide-Formoterol Fumarate] Shortness Of Breath    Breathing difficulty  . Alendronate Sodium Other (See Comments)    Kidney failure  . Boniva [Ibandronate Sodium] Other (See Comments)    Kidney failure   . Risedronate  Sodium   . Zetia [Ezetimibe] Other (See Comments)    Muscle pain   Social History   Socioeconomic History  . Marital status: Married    Spouse name: Not on file  . Number of children: Not on file  . Years of education: Not on file  . Highest education level: Not on file  Occupational History  . Not on file  Tobacco Use  . Smoking status: Former Research scientist (life sciences)  . Smokeless tobacco:  Never Used  Substance and Sexual Activity  . Alcohol use: No  . Drug use: No  . Sexual activity: Not on file    Comment: married, husband smokes.  Other Topics Concern  . Not on file  Social History Narrative  . Not on file   Social Determinants of Health   Financial Resource Strain:   . Difficulty of Paying Living Expenses: Not on file  Food Insecurity:   . Worried About Charity fundraiser in the Last Year: Not on file  . Ran Out of Food in the Last Year: Not on file  Transportation Needs:   . Lack of Transportation (Medical): Not on file  . Lack of Transportation (Non-Medical): Not on file  Physical Activity:   . Days of Exercise per Week: Not on file  . Minutes of Exercise per Session: Not on file  Stress:   . Feeling of Stress : Not on file  Social Connections:   . Frequency of Communication with Friends and Family: Not on file  . Frequency of Social Gatherings with Friends and Family: Not on file  . Attends Religious Services: Not on file  . Active Member of Clubs or Organizations: Not on file  . Attends Archivist Meetings: Not on file  . Marital Status: Not on file  Intimate Partner Violence:   . Fear of Current or Ex-Partner: Not on file  . Emotionally Abused: Not on file  . Physically Abused: Not on file  . Sexually Abused: Not on file   Family History  Problem Relation Age of Onset  . Cancer Father        pancreatic      Review of Systems  All other systems reviewed and are negative.      Objective:   Physical Exam  Constitutional: She is oriented to person,  place, and time. She appears well-developed and well-nourished. No distress.  HENT:  Head: Normocephalic and atraumatic.  Right Ear: External ear normal.  Left Ear: External ear normal.  Nose: Nose normal.  Mouth/Throat: Oropharynx is clear and moist. No oropharyngeal exudate.  Eyes: Pupils are equal, round, and reactive to light. Conjunctivae and EOM are normal. Right eye exhibits no discharge. Left eye exhibits no discharge. No scleral icterus.  Neck: No JVD present. No tracheal deviation present. No thyromegaly present.  Cardiovascular: Normal rate, regular rhythm, normal heart sounds and intact distal pulses. Exam reveals no gallop and no friction rub.  No murmur heard. Pulmonary/Chest: No accessory muscle usage or stridor. No respiratory distress. She has no decreased breath sounds. She has no wheezes. She has no rhonchi. She has no rales.  Abdominal: Soft. Bowel sounds are normal. She exhibits no distension and no mass. There is no abdominal tenderness. There is no rebound and no guarding.  Musculoskeletal:        General: No tenderness or edema. Normal range of motion.     Cervical back: Normal range of motion and neck supple.  Lymphadenopathy:    She has no cervical adenopathy.  Neurological: She is alert and oriented to person, place, and time. She has normal reflexes. No cranial nerve deficit. She exhibits normal muscle tone. Coordination normal.  Skin: Skin is warm. No rash noted. She is not diaphoretic. No erythema. No pallor.  Psychiatric: She has a normal mood and affect. Her behavior is normal. Judgment and thought content normal.  Vitals reviewed.         Assessment & Plan:  Routine general medical  examination at a health care facility  Essential hypertension  Chronic insomnia  Pulmonary emphysema, unspecified emphysema type (HCC)  Protein-calorie malnutrition, severe (HCC)  Osteoporosis, unspecified osteoporosis type, unspecified pathological fracture  presence  Pure hypercholesterolemia  Immunizations are listed below.  Her immunizations are up-to-date except for COVID-19 vaccination.  I did give the patient the contact information to try to schedule this.  She is also due for the shingles vaccine and I also discussed with her how to go about getting this vaccine at her local pharmacy however she declines this at the present time.  Her mammogram is up-to-date.  Her colonoscopy is up-to-date.  She does not require a Pap smear.  She has significant/severe osteoporosis.  I strongly recommended trying Fosamax or Prolia or Reclast or something as I feel it is only a matter of time before she suffers a pathologic fracture.  She will give this some consideration and then let me know her decision.  Her blood pressure today is well controlled.  Her cholesterol is better than last year and given her history of medication intolerance we have elected not to treat.  She denies any falls, depression, or memory loss. Immunization History  Administered Date(s) Administered  . Fluad Quad(high Dose 65+) 09/07/2019  . Influenza Whole 08/30/2010  . Influenza, High Dose Seasonal PF 10/20/2017, 10/22/2018  . Influenza,inj,Quad PF,6+ Mos 09/12/2013, 10/12/2014, 10/09/2015, 10/20/2016  . Pneumococcal Conjugate-13 04/03/2014  . Pneumococcal Polysaccharide-23 04/29/2004, 03/26/2011  . Td 04/29/2004

## 2020-02-08 ENCOUNTER — Other Ambulatory Visit: Payer: Self-pay | Admitting: Family Medicine

## 2020-02-08 MED ORDER — ADVAIR DISKUS 250-50 MCG/DOSE IN AEPB: 1.0000 | INHALATION_SPRAY | Freq: Two times a day (BID) | RESPIRATORY_TRACT | 5 refills | Status: DC

## 2020-02-28 ENCOUNTER — Telehealth: Payer: Self-pay | Admitting: Family Medicine

## 2020-02-28 MED ORDER — ALENDRONATE SODIUM 70 MG PO TABS: 70.0000 mg | ORAL_TABLET | ORAL | 3 refills | Status: AC

## 2020-02-28 NOTE — Telephone Encounter (Signed)
Patient calling for refill on her fosamax to [facility]

## 2020-02-28 NOTE — Telephone Encounter (Signed)
Medication called/sent to requested pharmacy  

## 2020-03-12 ENCOUNTER — Other Ambulatory Visit: Payer: Self-pay | Admitting: Family Medicine

## 2020-03-12 NOTE — Telephone Encounter (Signed)
Requesting refill    Ambien  LOV: 01/09/2020  LRF:  11/10/2019

## 2020-05-25 DIAGNOSIS — H01001 Unspecified blepharitis right upper eyelid: Secondary | ICD-10-CM | POA: Diagnosis not present

## 2020-05-25 DIAGNOSIS — H01005 Unspecified blepharitis left lower eyelid: Secondary | ICD-10-CM | POA: Diagnosis not present

## 2020-05-25 DIAGNOSIS — H01002 Unspecified blepharitis right lower eyelid: Secondary | ICD-10-CM | POA: Diagnosis not present

## 2020-05-25 DIAGNOSIS — H01004 Unspecified blepharitis left upper eyelid: Secondary | ICD-10-CM | POA: Diagnosis not present

## 2020-06-22 DIAGNOSIS — H01006 Unspecified blepharitis left eye, unspecified eyelid: Secondary | ICD-10-CM | POA: Diagnosis not present

## 2020-06-26 ENCOUNTER — Other Ambulatory Visit: Payer: Self-pay | Admitting: Family Medicine

## 2020-08-06 ENCOUNTER — Other Ambulatory Visit (HOSPITAL_COMMUNITY): Payer: Self-pay | Admitting: Family Medicine

## 2020-08-06 DIAGNOSIS — Z1231 Encounter for screening mammogram for malignant neoplasm of breast: Secondary | ICD-10-CM

## 2020-09-03 ENCOUNTER — Other Ambulatory Visit: Payer: Self-pay | Admitting: Family Medicine

## 2020-09-03 NOTE — Telephone Encounter (Signed)
Ok to refill??  Last office visit 01/09/2020.  Last refill 03/12/2020, #2 refills.

## 2020-09-04 ENCOUNTER — Other Ambulatory Visit: Payer: Self-pay | Admitting: Family Medicine

## 2020-09-06 ENCOUNTER — Other Ambulatory Visit: Payer: Self-pay

## 2020-09-06 ENCOUNTER — Ambulatory Visit (INDEPENDENT_AMBULATORY_CARE_PROVIDER_SITE_OTHER): Payer: Medicare Other | Admitting: *Deleted

## 2020-09-06 DIAGNOSIS — Z23 Encounter for immunization: Secondary | ICD-10-CM | POA: Diagnosis not present

## 2020-09-07 ENCOUNTER — Ambulatory Visit (HOSPITAL_COMMUNITY)
Admission: RE | Admit: 2020-09-07 | Discharge: 2020-09-07 | Disposition: A | Discharge status: Home / Self Care | Payer: Medicare Other | Source: Ambulatory Visit | Attending: Family Medicine | Admitting: Family Medicine

## 2020-09-07 DIAGNOSIS — Z1231 Encounter for screening mammogram for malignant neoplasm of breast: Secondary | ICD-10-CM | POA: Diagnosis not present

## 2020-09-11 ENCOUNTER — Other Ambulatory Visit (HOSPITAL_COMMUNITY): Payer: Self-pay | Admitting: Family Medicine

## 2020-09-11 DIAGNOSIS — R928 Other abnormal and inconclusive findings on diagnostic imaging of breast: Secondary | ICD-10-CM

## 2020-09-18 ENCOUNTER — Ambulatory Visit (HOSPITAL_COMMUNITY)
Admission: RE | Admit: 2020-09-18 | Discharge: 2020-09-18 | Disposition: A | Discharge status: Home / Self Care | Payer: Medicare Other | Source: Ambulatory Visit | Attending: Family Medicine | Admitting: Family Medicine

## 2020-09-18 ENCOUNTER — Other Ambulatory Visit: Payer: Self-pay

## 2020-09-18 DIAGNOSIS — R928 Other abnormal and inconclusive findings on diagnostic imaging of breast: Secondary | ICD-10-CM

## 2020-09-18 DIAGNOSIS — R922 Inconclusive mammogram: Secondary | ICD-10-CM | POA: Diagnosis not present

## 2020-10-02 ENCOUNTER — Other Ambulatory Visit: Payer: Self-pay | Admitting: Family Medicine

## 2020-10-20 ENCOUNTER — Encounter (HOSPITAL_COMMUNITY): Payer: Self-pay

## 2020-10-20 ENCOUNTER — Emergency Department (HOSPITAL_COMMUNITY): Payer: Medicare Other

## 2020-10-20 ENCOUNTER — Inpatient Hospital Stay (HOSPITAL_COMMUNITY): Payer: Medicare Other

## 2020-10-20 ENCOUNTER — Inpatient Hospital Stay: Payer: Self-pay

## 2020-10-20 ENCOUNTER — Inpatient Hospital Stay (HOSPITAL_COMMUNITY): Payer: Medicare Other | Admitting: Certified Registered"

## 2020-10-20 ENCOUNTER — Other Ambulatory Visit: Payer: Self-pay

## 2020-10-20 ENCOUNTER — Inpatient Hospital Stay (HOSPITAL_COMMUNITY)
Admission: EM | Admit: 2020-10-20 | Discharge: 2020-11-07 | DRG: 208 | Disposition: E | Payer: Medicare Other | Attending: General Surgery | Admitting: General Surgery

## 2020-10-20 DIAGNOSIS — J9690 Respiratory failure, unspecified, unspecified whether with hypoxia or hypercapnia: Secondary | ICD-10-CM

## 2020-10-20 DIAGNOSIS — M79652 Pain in left thigh: Secondary | ICD-10-CM | POA: Diagnosis not present

## 2020-10-20 DIAGNOSIS — K828 Other specified diseases of gallbladder: Secondary | ICD-10-CM | POA: Diagnosis not present

## 2020-10-20 DIAGNOSIS — Z9689 Presence of other specified functional implants: Secondary | ICD-10-CM

## 2020-10-20 DIAGNOSIS — Z743 Need for continuous supervision: Secondary | ICD-10-CM | POA: Diagnosis not present

## 2020-10-20 DIAGNOSIS — S2220XA Unspecified fracture of sternum, initial encounter for closed fracture: Secondary | ICD-10-CM

## 2020-10-20 DIAGNOSIS — R609 Edema, unspecified: Secondary | ICD-10-CM

## 2020-10-20 DIAGNOSIS — S82102A Unspecified fracture of upper end of left tibia, initial encounter for closed fracture: Secondary | ICD-10-CM | POA: Diagnosis not present

## 2020-10-20 DIAGNOSIS — Z041 Encounter for examination and observation following transport accident: Secondary | ICD-10-CM | POA: Diagnosis not present

## 2020-10-20 DIAGNOSIS — Z515 Encounter for palliative care: Secondary | ICD-10-CM | POA: Diagnosis not present

## 2020-10-20 DIAGNOSIS — R0682 Tachypnea, not elsewhere classified: Secondary | ICD-10-CM | POA: Diagnosis present

## 2020-10-20 DIAGNOSIS — J9601 Acute respiratory failure with hypoxia: Secondary | ICD-10-CM | POA: Diagnosis not present

## 2020-10-20 DIAGNOSIS — S32019A Unspecified fracture of first lumbar vertebra, initial encounter for closed fracture: Secondary | ICD-10-CM | POA: Diagnosis not present

## 2020-10-20 DIAGNOSIS — I313 Pericardial effusion (noninflammatory): Secondary | ICD-10-CM | POA: Diagnosis not present

## 2020-10-20 DIAGNOSIS — M545 Low back pain, unspecified: Secondary | ICD-10-CM | POA: Diagnosis not present

## 2020-10-20 DIAGNOSIS — Y92410 Unspecified street and highway as the place of occurrence of the external cause: Secondary | ICD-10-CM | POA: Diagnosis not present

## 2020-10-20 DIAGNOSIS — J84112 Idiopathic pulmonary fibrosis: Secondary | ICD-10-CM | POA: Diagnosis present

## 2020-10-20 DIAGNOSIS — R578 Other shock: Secondary | ICD-10-CM | POA: Diagnosis present

## 2020-10-20 DIAGNOSIS — S2241XA Multiple fractures of ribs, right side, initial encounter for closed fracture: Secondary | ICD-10-CM | POA: Diagnosis not present

## 2020-10-20 DIAGNOSIS — S270XXA Traumatic pneumothorax, initial encounter: Secondary | ICD-10-CM | POA: Diagnosis not present

## 2020-10-20 DIAGNOSIS — S82191A Other fracture of upper end of right tibia, initial encounter for closed fracture: Secondary | ICD-10-CM | POA: Diagnosis not present

## 2020-10-20 DIAGNOSIS — K5730 Diverticulosis of large intestine without perforation or abscess without bleeding: Secondary | ICD-10-CM | POA: Diagnosis not present

## 2020-10-20 DIAGNOSIS — E785 Hyperlipidemia, unspecified: Secondary | ICD-10-CM | POA: Diagnosis present

## 2020-10-20 DIAGNOSIS — J441 Chronic obstructive pulmonary disease with (acute) exacerbation: Secondary | ICD-10-CM | POA: Diagnosis not present

## 2020-10-20 DIAGNOSIS — K7460 Unspecified cirrhosis of liver: Secondary | ICD-10-CM | POA: Diagnosis present

## 2020-10-20 DIAGNOSIS — D62 Acute posthemorrhagic anemia: Secondary | ICD-10-CM | POA: Diagnosis not present

## 2020-10-20 DIAGNOSIS — N170 Acute kidney failure with tubular necrosis: Secondary | ICD-10-CM | POA: Diagnosis not present

## 2020-10-20 DIAGNOSIS — Z23 Encounter for immunization: Secondary | ICD-10-CM | POA: Diagnosis not present

## 2020-10-20 DIAGNOSIS — I700 Atherosclerosis of aorta: Secondary | ICD-10-CM | POA: Diagnosis present

## 2020-10-20 DIAGNOSIS — Z79899 Other long term (current) drug therapy: Secondary | ICD-10-CM

## 2020-10-20 DIAGNOSIS — M810 Age-related osteoporosis without current pathological fracture: Secondary | ICD-10-CM | POA: Diagnosis present

## 2020-10-20 DIAGNOSIS — Z66 Do not resuscitate: Secondary | ICD-10-CM | POA: Diagnosis not present

## 2020-10-20 DIAGNOSIS — Z87891 Personal history of nicotine dependence: Secondary | ICD-10-CM

## 2020-10-20 DIAGNOSIS — J690 Pneumonitis due to inhalation of food and vomit: Secondary | ICD-10-CM | POA: Diagnosis present

## 2020-10-20 DIAGNOSIS — J439 Emphysema, unspecified: Secondary | ICD-10-CM | POA: Diagnosis not present

## 2020-10-20 DIAGNOSIS — N390 Urinary tract infection, site not specified: Secondary | ICD-10-CM | POA: Diagnosis not present

## 2020-10-20 DIAGNOSIS — R000 Tachycardia, unspecified: Secondary | ICD-10-CM | POA: Diagnosis not present

## 2020-10-20 DIAGNOSIS — S0990XA Unspecified injury of head, initial encounter: Secondary | ICD-10-CM | POA: Diagnosis not present

## 2020-10-20 DIAGNOSIS — K920 Hematemesis: Secondary | ICD-10-CM | POA: Diagnosis not present

## 2020-10-20 DIAGNOSIS — J8489 Other specified interstitial pulmonary diseases: Secondary | ICD-10-CM | POA: Diagnosis present

## 2020-10-20 DIAGNOSIS — S27321A Contusion of lung, unilateral, initial encounter: Secondary | ICD-10-CM | POA: Diagnosis present

## 2020-10-20 DIAGNOSIS — S82142A Displaced bicondylar fracture of left tibia, initial encounter for closed fracture: Secondary | ICD-10-CM | POA: Diagnosis not present

## 2020-10-20 DIAGNOSIS — R54 Age-related physical debility: Secondary | ICD-10-CM | POA: Diagnosis present

## 2020-10-20 DIAGNOSIS — M25061 Hemarthrosis, right knee: Secondary | ICD-10-CM | POA: Diagnosis not present

## 2020-10-20 DIAGNOSIS — Z95828 Presence of other vascular implants and grafts: Secondary | ICD-10-CM

## 2020-10-20 DIAGNOSIS — Z809 Family history of malignant neoplasm, unspecified: Secondary | ICD-10-CM

## 2020-10-20 DIAGNOSIS — R079 Chest pain, unspecified: Secondary | ICD-10-CM | POA: Diagnosis not present

## 2020-10-20 DIAGNOSIS — J90 Pleural effusion, not elsewhere classified: Secondary | ICD-10-CM | POA: Diagnosis not present

## 2020-10-20 DIAGNOSIS — S272XXA Traumatic hemopneumothorax, initial encounter: Secondary | ICD-10-CM | POA: Diagnosis not present

## 2020-10-20 DIAGNOSIS — J470 Bronchiectasis with acute lower respiratory infection: Secondary | ICD-10-CM | POA: Diagnosis present

## 2020-10-20 DIAGNOSIS — R0902 Hypoxemia: Secondary | ICD-10-CM

## 2020-10-20 DIAGNOSIS — R21 Rash and other nonspecific skin eruption: Secondary | ICD-10-CM | POA: Diagnosis not present

## 2020-10-20 DIAGNOSIS — D689 Coagulation defect, unspecified: Secondary | ICD-10-CM | POA: Diagnosis present

## 2020-10-20 DIAGNOSIS — T50904A Poisoning by unspecified drugs, medicaments and biological substances, undetermined, initial encounter: Secondary | ICD-10-CM | POA: Diagnosis not present

## 2020-10-20 DIAGNOSIS — S42022A Displaced fracture of shaft of left clavicle, initial encounter for closed fracture: Secondary | ICD-10-CM | POA: Diagnosis present

## 2020-10-20 DIAGNOSIS — Z4682 Encounter for fitting and adjustment of non-vascular catheter: Secondary | ICD-10-CM | POA: Diagnosis not present

## 2020-10-20 DIAGNOSIS — S2231XA Fracture of one rib, right side, initial encounter for closed fracture: Secondary | ICD-10-CM | POA: Diagnosis not present

## 2020-10-20 DIAGNOSIS — I70202 Unspecified atherosclerosis of native arteries of extremities, left leg: Secondary | ICD-10-CM | POA: Diagnosis present

## 2020-10-20 DIAGNOSIS — I959 Hypotension, unspecified: Secondary | ICD-10-CM

## 2020-10-20 DIAGNOSIS — Z20822 Contact with and (suspected) exposure to covid-19: Secondary | ICD-10-CM | POA: Diagnosis not present

## 2020-10-20 DIAGNOSIS — J9600 Acute respiratory failure, unspecified whether with hypoxia or hypercapnia: Secondary | ICD-10-CM | POA: Diagnosis not present

## 2020-10-20 DIAGNOSIS — M4856XA Collapsed vertebra, not elsewhere classified, lumbar region, initial encounter for fracture: Secondary | ICD-10-CM | POA: Diagnosis present

## 2020-10-20 DIAGNOSIS — D649 Anemia, unspecified: Secondary | ICD-10-CM | POA: Diagnosis not present

## 2020-10-20 DIAGNOSIS — J440 Chronic obstructive pulmonary disease with acute lower respiratory infection: Secondary | ICD-10-CM | POA: Diagnosis present

## 2020-10-20 DIAGNOSIS — S42019A Nondisplaced fracture of sternal end of unspecified clavicle, initial encounter for closed fracture: Secondary | ICD-10-CM | POA: Diagnosis not present

## 2020-10-20 DIAGNOSIS — R6889 Other general symptoms and signs: Secondary | ICD-10-CM | POA: Diagnosis not present

## 2020-10-20 DIAGNOSIS — T1490XA Injury, unspecified, initial encounter: Secondary | ICD-10-CM

## 2020-10-20 DIAGNOSIS — I2510 Atherosclerotic heart disease of native coronary artery without angina pectoris: Secondary | ICD-10-CM | POA: Diagnosis present

## 2020-10-20 DIAGNOSIS — R600 Localized edema: Secondary | ICD-10-CM | POA: Diagnosis not present

## 2020-10-20 DIAGNOSIS — S2242XA Multiple fractures of ribs, left side, initial encounter for closed fracture: Secondary | ICD-10-CM | POA: Diagnosis not present

## 2020-10-20 DIAGNOSIS — S2243XA Multiple fractures of ribs, bilateral, initial encounter for closed fracture: Secondary | ICD-10-CM

## 2020-10-20 DIAGNOSIS — Z7983 Long term (current) use of bisphosphonates: Secondary | ICD-10-CM

## 2020-10-20 DIAGNOSIS — R188 Other ascites: Secondary | ICD-10-CM | POA: Diagnosis not present

## 2020-10-20 DIAGNOSIS — S82192A Other fracture of upper end of left tibia, initial encounter for closed fracture: Secondary | ICD-10-CM | POA: Diagnosis not present

## 2020-10-20 DIAGNOSIS — Z8262 Family history of osteoporosis: Secondary | ICD-10-CM

## 2020-10-20 DIAGNOSIS — T148XXA Other injury of unspecified body region, initial encounter: Secondary | ICD-10-CM

## 2020-10-20 DIAGNOSIS — E874 Mixed disorder of acid-base balance: Secondary | ICD-10-CM | POA: Diagnosis not present

## 2020-10-20 DIAGNOSIS — Z4659 Encounter for fitting and adjustment of other gastrointestinal appliance and device: Secondary | ICD-10-CM

## 2020-10-20 DIAGNOSIS — S82832A Other fracture of upper and lower end of left fibula, initial encounter for closed fracture: Secondary | ICD-10-CM | POA: Diagnosis present

## 2020-10-20 DIAGNOSIS — I10 Essential (primary) hypertension: Secondary | ICD-10-CM | POA: Diagnosis present

## 2020-10-20 DIAGNOSIS — S42002A Fracture of unspecified part of left clavicle, initial encounter for closed fracture: Secondary | ICD-10-CM | POA: Diagnosis not present

## 2020-10-20 DIAGNOSIS — S82201A Unspecified fracture of shaft of right tibia, initial encounter for closed fracture: Secondary | ICD-10-CM | POA: Diagnosis not present

## 2020-10-20 DIAGNOSIS — I9589 Other hypotension: Secondary | ICD-10-CM | POA: Diagnosis present

## 2020-10-20 DIAGNOSIS — T797XXA Traumatic subcutaneous emphysema, initial encounter: Secondary | ICD-10-CM | POA: Diagnosis not present

## 2020-10-20 DIAGNOSIS — I739 Peripheral vascular disease, unspecified: Secondary | ICD-10-CM | POA: Diagnosis not present

## 2020-10-20 DIAGNOSIS — S3991XA Unspecified injury of abdomen, initial encounter: Secondary | ICD-10-CM | POA: Diagnosis not present

## 2020-10-20 DIAGNOSIS — Z789 Other specified health status: Secondary | ICD-10-CM

## 2020-10-20 DIAGNOSIS — I499 Cardiac arrhythmia, unspecified: Secondary | ICD-10-CM | POA: Diagnosis not present

## 2020-10-20 DIAGNOSIS — Z978 Presence of other specified devices: Secondary | ICD-10-CM

## 2020-10-20 DIAGNOSIS — K8020 Calculus of gallbladder without cholecystitis without obstruction: Secondary | ICD-10-CM | POA: Diagnosis not present

## 2020-10-20 DIAGNOSIS — Z7951 Long term (current) use of inhaled steroids: Secondary | ICD-10-CM

## 2020-10-20 DIAGNOSIS — S32501A Unspecified fracture of right pubis, initial encounter for closed fracture: Secondary | ICD-10-CM | POA: Diagnosis not present

## 2020-10-20 DIAGNOSIS — J939 Pneumothorax, unspecified: Secondary | ICD-10-CM

## 2020-10-20 DIAGNOSIS — S2239XA Fracture of one rib, unspecified side, initial encounter for closed fracture: Secondary | ICD-10-CM | POA: Diagnosis not present

## 2020-10-20 LAB — POCT I-STAT 7, (LYTES, BLD GAS, ICA,H+H)
Acid-base deficit: 5 mmol/L — ABNORMAL HIGH (ref 0.0–2.0)
Acid-base deficit: 5 mmol/L — ABNORMAL HIGH (ref 0.0–2.0)
Acid-base deficit: 10 mmol/L — ABNORMAL HIGH (ref 0.0–2.0)
Acid-base deficit: 9 mmol/L — ABNORMAL HIGH (ref 0.0–2.0)
Bicarbonate: 24.6 mmol/L (ref 20.0–28.0)
Bicarbonate: 24.6 mmol/L (ref 20.0–28.0)
Bicarbonate: 18.9 mmol/L — ABNORMAL LOW (ref 20.0–28.0)
Bicarbonate: 20 mmol/L (ref 20.0–28.0)
Calcium, Ion: 1.19 mmol/L (ref 1.15–1.40)
Calcium, Ion: 1.2 mmol/L (ref 1.15–1.40)
Calcium, Ion: 1.13 mmol/L — ABNORMAL LOW (ref 1.15–1.40)
Calcium, Ion: 1.15 mmol/L (ref 1.15–1.40)
HCT: 30 % — ABNORMAL LOW (ref 36.0–46.0)
HCT: 32 % — ABNORMAL LOW (ref 36.0–46.0)
HCT: 27 % — ABNORMAL LOW (ref 36.0–46.0)
HCT: 29 % — ABNORMAL LOW (ref 36.0–46.0)
Hemoglobin: 10.2 g/dL — ABNORMAL LOW (ref 12.0–15.0)
Hemoglobin: 10.9 g/dL — ABNORMAL LOW (ref 12.0–15.0)
Hemoglobin: 9.2 g/dL — ABNORMAL LOW (ref 12.0–15.0)
Hemoglobin: 9.9 g/dL — ABNORMAL LOW (ref 12.0–15.0)
O2 Saturation: 82 %
O2 Saturation: 99 %
O2 Saturation: 68 %
O2 Saturation: 91 %
Patient temperature: 98.1
Patient temperature: 98.1
Patient temperature: 13
Patient temperature: 97.5
Potassium: 4 mmol/L (ref 3.5–5.1)
Potassium: 4.5 mmol/L (ref 3.5–5.1)
Potassium: 4.5 mmol/L (ref 3.5–5.1)
Potassium: 4.6 mmol/L (ref 3.5–5.1)
Sodium: 142 mmol/L (ref 135–145)
Sodium: 143 mmol/L (ref 135–145)
Sodium: 142 mmol/L (ref 135–145)
Sodium: 143 mmol/L (ref 135–145)
TCO2: 27 mmol/L (ref 22–32)
TCO2: 27 mmol/L (ref 22–32)
TCO2: 21 mmol/L — ABNORMAL LOW (ref 22–32)
TCO2: 22 mmol/L (ref 22–32)
pCO2 arterial: 67.1 mmHg (ref 32.0–48.0)
pCO2 arterial: 69.7 mmHg (ref 32.0–48.0)
pCO2 arterial: 21.3 mmHg — ABNORMAL LOW (ref 32.0–48.0)
pCO2 arterial: 55.5 mmHg — ABNORMAL HIGH (ref 32.0–48.0)
pH, Arterial: 7.154 — CL (ref 7.350–7.450)
pH, Arterial: 7.17 — CL (ref 7.350–7.450)
pH, Arterial: 7.136 — CL (ref 7.350–7.450)
pH, Arterial: 7.433 (ref 7.350–7.450)
pO2, Arterial: 172 mmHg — ABNORMAL HIGH (ref 83.0–108.0)
pO2, Arterial: 59 mmHg — ABNORMAL LOW (ref 83.0–108.0)
pO2, Arterial: 76 mmHg — ABNORMAL LOW (ref 83.0–108.0)
pO2, Arterial: <15 mmHg — CL (ref 83.0–108.0)

## 2020-10-20 LAB — BASIC METABOLIC PANEL
Anion gap: 8 (ref 5–15)
BUN: 14 mg/dL (ref 8–23)
CO2: 21 mmol/L — ABNORMAL LOW (ref 22–32)
Calcium: 7.6 mg/dL — ABNORMAL LOW (ref 8.9–10.3)
Chloride: 111 mmol/L (ref 98–111)
Creatinine, Ser: 0.58 mg/dL (ref 0.44–1.00)
GFR, Estimated: >60 mL/min (ref >60)
Glucose, Bld: 133 mg/dL — ABNORMAL HIGH (ref 70–99)
Potassium: 4.1 mmol/L (ref 3.5–5.1)
Sodium: 140 mmol/L (ref 135–145)

## 2020-10-20 LAB — COMPREHENSIVE METABOLIC PANEL
ALT: 40 U/L (ref 0–44)
ALT: 36 U/L (ref 0–44)
AST: 73 U/L — ABNORMAL HIGH (ref 15–41)
AST: 71 U/L — ABNORMAL HIGH (ref 15–41)
Albumin: 3.3 g/dL — ABNORMAL LOW (ref 3.5–5.0)
Albumin: 2.2 g/dL — ABNORMAL LOW (ref 3.5–5.0)
Alkaline Phosphatase: 153 U/L — ABNORMAL HIGH (ref 38–126)
Alkaline Phosphatase: 91 U/L (ref 38–126)
Anion gap: 6 (ref 5–15)
Anion gap: 8 (ref 5–15)
BUN: 15 mg/dL (ref 8–23)
BUN: 16 mg/dL (ref 8–23)
CO2: 27 mmol/L (ref 22–32)
CO2: 19 mmol/L — ABNORMAL LOW (ref 22–32)
Calcium: 8.6 mg/dL — ABNORMAL LOW (ref 8.9–10.3)
Calcium: 7.4 mg/dL — ABNORMAL LOW (ref 8.9–10.3)
Chloride: 103 mmol/L (ref 98–111)
Chloride: 113 mmol/L — ABNORMAL HIGH (ref 98–111)
Creatinine, Ser: 0.39 mg/dL — ABNORMAL LOW (ref 0.44–1.00)
Creatinine, Ser: 0.6 mg/dL (ref 0.44–1.00)
GFR, Estimated: >60 mL/min (ref >60)
GFR, Estimated: >60 mL/min (ref >60)
Glucose, Bld: 123 mg/dL — ABNORMAL HIGH (ref 70–99)
Glucose, Bld: 162 mg/dL — ABNORMAL HIGH (ref 70–99)
Potassium: 3.7 mmol/L (ref 3.5–5.1)
Potassium: 4.3 mmol/L (ref 3.5–5.1)
Sodium: 136 mmol/L (ref 135–145)
Sodium: 140 mmol/L (ref 135–145)
Total Bilirubin: 1.1 mg/dL (ref 0.3–1.2)
Total Bilirubin: 1.8 mg/dL — ABNORMAL HIGH (ref 0.3–1.2)
Total Protein: 7.3 g/dL (ref 6.5–8.1)
Total Protein: 5 g/dL — ABNORMAL LOW (ref 6.5–8.1)

## 2020-10-20 LAB — URINALYSIS, ROUTINE W REFLEX MICROSCOPIC
Bacteria, UA: NONE SEEN
Bilirubin Urine: NEGATIVE
Glucose, UA: NEGATIVE mg/dL
Ketones, ur: NEGATIVE mg/dL
Leukocytes,Ua: NEGATIVE
Nitrite: NEGATIVE
Protein, ur: NEGATIVE mg/dL
Specific Gravity, Urine: >1.046 — ABNORMAL HIGH (ref 1.005–1.030)
pH: 5 (ref 5.0–8.0)

## 2020-10-20 LAB — CBC
HCT: 42.5 % (ref 36.0–46.0)
HCT: 31.3 % — ABNORMAL LOW (ref 36.0–46.0)
Hemoglobin: 14.2 g/dL (ref 12.0–15.0)
Hemoglobin: 10.3 g/dL — ABNORMAL LOW (ref 12.0–15.0)
MCH: 33.6 pg (ref 26.0–34.0)
MCH: 34.3 pg — ABNORMAL HIGH (ref 26.0–34.0)
MCHC: 33.4 g/dL (ref 30.0–36.0)
MCHC: 32.9 g/dL (ref 30.0–36.0)
MCV: 100.7 fL — ABNORMAL HIGH (ref 80.0–100.0)
MCV: 104.3 fL — ABNORMAL HIGH (ref 80.0–100.0)
Platelets: 413 10*3/uL — ABNORMAL HIGH (ref 150–400)
Platelets: 327 10*3/uL (ref 150–400)
RBC: 4.22 MIL/uL (ref 3.87–5.11)
RBC: 3 MIL/uL — ABNORMAL LOW (ref 3.87–5.11)
RDW: 15.6 % — ABNORMAL HIGH (ref 11.5–15.5)
RDW: 15.5 % (ref 11.5–15.5)
WBC: 19.3 10*3/uL — ABNORMAL HIGH (ref 4.0–10.5)
WBC: 29.9 10*3/uL — ABNORMAL HIGH (ref 4.0–10.5)
nRBC: 0 % (ref 0.0–0.2)
nRBC: 0 % (ref 0.0–0.2)

## 2020-10-20 LAB — MRSA PCR SCREENING: MRSA by PCR: NEGATIVE

## 2020-10-20 LAB — TROPONIN I (HIGH SENSITIVITY)
Troponin I (High Sensitivity): 14 ng/L (ref <18)
Troponin I (High Sensitivity): 24 ng/L — ABNORMAL HIGH (ref <18)
Troponin I (High Sensitivity): 34 ng/L — ABNORMAL HIGH (ref <18)

## 2020-10-20 LAB — TRIGLYCERIDES: Triglycerides: 32 mg/dL (ref <150)

## 2020-10-20 LAB — PROTIME-INR
INR: 1.2 (ref 0.8–1.2)
Prothrombin Time: 14.8 seconds (ref 11.4–15.2)

## 2020-10-20 LAB — RESPIRATORY PANEL BY RT PCR (FLU A&B, COVID)
Influenza A by PCR: NEGATIVE
Influenza B by PCR: NEGATIVE
SARS Coronavirus 2 by RT PCR: NEGATIVE

## 2020-10-20 LAB — LACTIC ACID, PLASMA: Lactic Acid, Venous: 2.2 mmol/L (ref 0.5–1.9)

## 2020-10-20 LAB — ETHANOL: Alcohol, Ethyl (B): <10 mg/dL (ref <10)

## 2020-10-20 LAB — SAMPLE TO BLOOD BANK

## 2020-10-20 MED ORDER — ORAL CARE MOUTH RINSE
15.0000 mL | OROMUCOSAL | Status: DC
  Administered 2020-10-20 – 2020-10-21 (×10): 15 mL via OROMUCOSAL

## 2020-10-20 MED ORDER — FENTANYL 2500MCG IN NS 250ML (10MCG/ML) PREMIX INFUSION
0.0000 ug/h | INTRAVENOUS | Status: DC
Start: 2020-10-20 — End: 2020-10-20

## 2020-10-20 MED ORDER — DOCUSATE SODIUM 50 MG/5ML PO LIQD
100.0000 mg | Freq: Two times a day (BID) | ORAL | Status: DC
  Administered 2020-10-20 (×2): 100 mg
  Filled 2020-10-20 (×2): qty 10

## 2020-10-20 MED ORDER — DOCUSATE SODIUM 100 MG PO CAPS: 100.0000 mg | ORAL_CAPSULE | Freq: Two times a day (BID) | ORAL | Status: DC

## 2020-10-20 MED ORDER — SODIUM CHLORIDE 0.9 % IV BOLUS
500.0000 mL | Freq: Once | INTRAVENOUS | Status: AC
  Administered 2020-10-20: 500 mL via INTRAVENOUS

## 2020-10-20 MED ORDER — FENTANYL 2500MCG IN NS 250ML (10MCG/ML) PREMIX INFUSION
25.0000 ug/h | INTRAVENOUS | Status: DC
  Administered 2020-10-20: 25 ug/h via INTRAVENOUS

## 2020-10-20 MED ORDER — ONDANSETRON HCL 4 MG/2ML IJ SOLN: 4.0000 mg | Freq: Four times a day (QID) | INTRAMUSCULAR | Status: DC | PRN

## 2020-10-20 MED ORDER — FENTANYL CITRATE (PF) 100 MCG/2ML IJ SOLN
50.0000 ug | Freq: Once | INTRAMUSCULAR | Status: AC
  Administered 2020-10-20: 50 ug via INTRAVENOUS
  Filled 2020-10-20: qty 2

## 2020-10-20 MED ORDER — CHLORHEXIDINE GLUCONATE 0.12% ORAL RINSE (MEDLINE KIT)
15.0000 mL | Freq: Two times a day (BID) | OROMUCOSAL | Status: DC
  Administered 2020-10-20 – 2020-10-21 (×2): 15 mL via OROMUCOSAL

## 2020-10-20 MED ORDER — ETOMIDATE 2 MG/ML IV SOLN
INTRAVENOUS | Status: DC | PRN
  Administered 2020-10-20: 14 mg via INTRAVENOUS

## 2020-10-20 MED ORDER — SODIUM CHLORIDE 0.9 % IV SOLN: 250.0000 mL | INTRAVENOUS | Status: DC

## 2020-10-20 MED ORDER — SODIUM CHLORIDE 0.9 % IV BOLUS
1000.0000 mL | Freq: Once | INTRAVENOUS | Status: AC
  Administered 2020-10-20: 1000 mL via INTRAVENOUS

## 2020-10-20 MED ORDER — NOREPINEPHRINE 16 MG/250ML-% IV SOLN
0.0000 ug/min | INTRAVENOUS | Status: DC
  Administered 2020-10-20 – 2020-10-21 (×3): 40 ug/min via INTRAVENOUS
  Filled 2020-10-20: qty 500
  Filled 2020-10-20 (×2): qty 250

## 2020-10-20 MED ORDER — ACETAMINOPHEN 325 MG PO TABS: 650.0000 mg | ORAL_TABLET | ORAL | Status: DC | PRN

## 2020-10-20 MED ORDER — PHENYLEPHRINE HCL-NACL 10-0.9 MG/250ML-% IV SOLN
25.0000 ug/min | INTRAVENOUS | Status: DC
  Administered 2020-10-20: 170 ug/min via INTRAVENOUS
  Administered 2020-10-20: 50 ug/min via INTRAVENOUS
  Administered 2020-10-20 (×3): 200 ug/min via INTRAVENOUS
  Filled 2020-10-20 (×2): qty 500
  Filled 2020-10-20: qty 250
  Filled 2020-10-20: qty 500

## 2020-10-20 MED ORDER — CHLORHEXIDINE GLUCONATE CLOTH 2 % EX PADS: 6.0000 | MEDICATED_PAD | Freq: Every day | CUTANEOUS | Status: DC

## 2020-10-20 MED ORDER — PROPOFOL 1000 MG/100ML IV EMUL: 0.0000 ug/kg/min | INTRAVENOUS | Status: DC

## 2020-10-20 MED ORDER — PHENYLEPHRINE 40 MCG/ML (10ML) SYRINGE FOR IV PUSH (FOR BLOOD PRESSURE SUPPORT): 80.0000 ug | PREFILLED_SYRINGE | Freq: Once | INTRAVENOUS | Status: DC | PRN

## 2020-10-20 MED ORDER — PANTOPRAZOLE SODIUM 40 MG PO PACK
40.0000 mg | PACK | Freq: Every day | ORAL | Status: DC
  Administered 2020-10-20 – 2020-10-21 (×2): 40 mg
  Filled 2020-10-20 (×2): qty 20

## 2020-10-20 MED ORDER — ALBUMIN HUMAN 5 % IV SOLN
25.0000 g | Freq: Once | INTRAVENOUS | Status: AC
  Administered 2020-10-20: 25 g via INTRAVENOUS
  Filled 2020-10-20: qty 500

## 2020-10-20 MED ORDER — FENTANYL CITRATE (PF) 100 MCG/2ML IJ SOLN: 50.0000 ug | Freq: Once | INTRAMUSCULAR | Status: DC

## 2020-10-20 MED ORDER — PHENYLEPHRINE 40 MCG/ML (10ML) SYRINGE FOR IV PUSH (FOR BLOOD PRESSURE SUPPORT)
PREFILLED_SYRINGE | INTRAVENOUS | Status: DC | PRN
  Administered 2020-10-20: 120 ug via INTRAVENOUS

## 2020-10-20 MED ORDER — IOHEXOL 350 MG/ML SOLN
100.0000 mL | Freq: Once | INTRAVENOUS | Status: AC | PRN
  Administered 2020-10-20: 100 mL via INTRAVENOUS

## 2020-10-20 MED ORDER — IOHEXOL 9 MG/ML PO SOLN
ORAL | Status: AC
  Administered 2020-10-20: 500 mL via GASTROSTOMY
  Filled 2020-10-20: qty 500

## 2020-10-20 MED ORDER — POLYETHYLENE GLYCOL 3350 17 G PO PACK: 17.0000 g | PACK | Freq: Every day | ORAL | Status: DC

## 2020-10-20 MED ORDER — TETANUS-DIPHTH-ACELL PERTUSSIS 5-2.5-18.5 LF-MCG/0.5 IM SUSY
0.5000 mL | PREFILLED_SYRINGE | Freq: Once | INTRAMUSCULAR | Status: AC
  Administered 2020-10-20: 0.5 mL via INTRAMUSCULAR
  Filled 2020-10-20: qty 0.5

## 2020-10-20 MED ORDER — FENTANYL BOLUS VIA INFUSION
25.0000 ug | INTRAVENOUS | Status: DC | PRN
  Filled 2020-10-20: qty 25

## 2020-10-20 MED ORDER — FENTANYL CITRATE (PF) 100 MCG/2ML IJ SOLN
25.0000 ug | Freq: Once | INTRAMUSCULAR | Status: AC
  Administered 2020-10-20: 25 ug via INTRAVENOUS

## 2020-10-20 MED ORDER — PROPOFOL 1000 MG/100ML IV EMUL
0.0000 ug/kg/min | INTRAVENOUS | Status: DC
  Filled 2020-10-20: qty 100

## 2020-10-20 MED ORDER — PROPOFOL 1000 MG/100ML IV EMUL
5.0000 ug/kg/min | INTRAVENOUS | Status: DC
Start: 2020-10-20 — End: 2020-10-20

## 2020-10-20 MED ORDER — PHENYLEPHRINE 40 MCG/ML (10ML) SYRINGE FOR IV PUSH (FOR BLOOD PRESSURE SUPPORT)
PREFILLED_SYRINGE | INTRAVENOUS | Status: AC
  Filled 2020-10-20: qty 10

## 2020-10-20 MED ORDER — ONDANSETRON 4 MG PO TBDP: 4.0000 mg | ORAL_TABLET | Freq: Four times a day (QID) | ORAL | Status: DC | PRN

## 2020-10-20 MED ORDER — ALBUTEROL SULFATE HFA 108 (90 BASE) MCG/ACT IN AERS
1.0000 | INHALATION_SPRAY | RESPIRATORY_TRACT | Status: DC | PRN
  Filled 2020-10-20: qty 6.7

## 2020-10-20 MED ORDER — SUCCINYLCHOLINE CHLORIDE 20 MG/ML IJ SOLN
INTRAMUSCULAR | Status: DC | PRN
  Administered 2020-10-20: 100 mg via INTRAVENOUS

## 2020-10-20 MED ORDER — NOREPINEPHRINE 4 MG/250ML-% IV SOLN
0.0000 ug/min | INTRAVENOUS | Status: DC
  Administered 2020-10-20: 2 ug/min via INTRAVENOUS
  Administered 2020-10-20: 40 ug/min via INTRAVENOUS
  Filled 2020-10-20 (×2): qty 250

## 2020-10-20 MED ORDER — IOHEXOL 9 MG/ML PO SOLN: 500.0000 mL | ORAL | Status: DC

## 2020-10-20 MED ORDER — KCL IN DEXTROSE-NACL 20-5-0.45 MEQ/L-%-% IV SOLN
INTRAVENOUS | Status: DC
  Filled 2020-10-20 (×2): qty 1000

## 2020-10-20 MED ORDER — DOCUSATE SODIUM 50 MG/5ML PO LIQD: 100.0000 mg | Freq: Two times a day (BID) | ORAL | Status: DC

## 2020-10-20 MED ORDER — POLYETHYLENE GLYCOL 3350 17 G PO PACK
17.0000 g | PACK | Freq: Every day | ORAL | Status: DC
  Administered 2020-10-20: 17 g
  Filled 2020-10-20: qty 1

## 2020-10-20 MED ORDER — HEPARIN SODIUM (PORCINE) 5000 UNIT/ML IJ SOLN
5000.0000 [IU] | Freq: Three times a day (TID) | INTRAMUSCULAR | Status: DC
  Administered 2020-10-20 (×2): 5000 [IU] via SUBCUTANEOUS
  Filled 2020-10-20 (×2): qty 1
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
## 2020-10-20 NOTE — ED Notes (Addendum)
I assumed care of this patient at this time. At the time of assuming care, bed is locked in the lowest position, side rails x2, cal bel within reach. C-Collar in place; this RN educated that patient's SpO2 post CT scan was 70's and oxygen increased by PA at this time. Vital signs cycling q10 minutes. Pt is alert and oriented yet slow to answer questions.  Pt reports 8/10 pain, provider made aware. Will continue close observation at this time.
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
## 2020-10-20 NOTE — Op Note (Signed)
Right IJ Central Venous Catheter Insertion Procedure Note  [name]  [id number]  1941-01-17  Date:10/08/2020  Time:6:11 PM   Provider Performing:Maghan Jessee Barry Dienes   Procedure: Insertion of Non-tunneled Central Venous Catheter(36556) without US guidance  Indication(s) Medication administration  Consent Risks of the procedure as well as the alternatives and risks of each were explained to the patient and/or caregiver.  Consent for the procedure was obtained and is signed in the bedside chart  Anesthesia topical and IV fentanyl  Timeout Verified patient identification, verified procedure, site/side was marked, verified correct patient position, special equipment/implants available, medications/allergies/relevant history reviewed, required imaging and test results available.  Sterile Technique Maximal sterile technique including full sterile barrier drape, hand hygiene, sterile gown, sterile gloves, mask, hair covering, sterile ultrasound probe cover (if used).  Procedure Description The right chest/neck was prepped and draped sterilely. Right subclavian region was first accessed, but the wire was seen heading into the neck.  The right IJ was then addressed.  The ultrasound probe was not giving a picture, so anatomic landmarks were used to access the right IJ.   Without real-time ultrasound guidance a central venous catheter was placed into the right internal jugular vein. Nonpulsatile blood flow and easy flushing noted in all ports.  The catheter was sutured in place at 12 cm and sterile dressing applied.  Complications/Tolerance None; patient tolerated the procedure well. CXR confirmed good position in the SVC.    EBL Minimal  Specimen(s) None

## 2020-10-20 NOTE — ED Notes (Addendum)
This RN to bedside for assessment. C-Collar removed at this time by PA, vital signs recycled. Pt appears to be moaning and restless in bed, reports 8/10 pain. Pt remains alert and oriented. Educated to breath through nose of supplemental oxygen. Vital signs cycing q2 minutes at this time. Will continue to monitor.
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
## 2020-10-20 NOTE — Op Note (Signed)
Left Chest Tube Insertion Procedure Note  Indications:  Clinically significant Pneumothorax  Pre-operative Diagnosis: Pneumothorax  Post-operative Diagnosis: Pneumothorax  Procedure Details  Left chest tube placed emergently due to hemodynamic instability from PTX.  After sterile skin prep, using standard technique, a 14 French tube was placed in the left lateral 5th rib space.  Findings: Rush of air   Estimated Blood Loss:  Minimal         Specimens:  None              Complications:  None; patient tolerated the procedure well.         Disposition: ICU - intubated and critically ill.         Condition: unstable  Attending Attestation: I performed the procedure.

## 2020-10-20 NOTE — ED Triage Notes (Addendum)
EMS administered 4mg  morphine PTA. C-collar in place.

## 2020-10-20 NOTE — ED Notes (Signed)
Called husband x2 numbers with no answer.

## 2020-10-20 NOTE — Progress Notes (Signed)
Orthopedic Tech Progress Note Patient Details:  [name] [birth year] [id number]  Ortho Devices Type of Ortho Device: Knee Immobilizer Ortho Device/Splint Location: LLE Ortho Device/Splint Interventions: Application, Ordered   Post Interventions Patient Tolerated: Well   [name] [name] 11/03/2020, 2:45 PM

## 2020-10-20 NOTE — Progress Notes (Signed)
RT obtained ABG on pt with the following results. MD Byerly notified of critical pH of 7.136. Increased rate and changed VT to set 500. RT will continue to monitor.    Results for COREAN, YOSHIMURA (MRN [id number]) as of 10/26/2020 20:01  Ref. Range 11/02/2020 19:46  Sample type Unknown ARTERIAL  pH, Arterial Latest Ref Range: 7.35 - 7.45  7.136 (LL)  pCO2 arterial Latest Ref Range: 32 - 48 mmHg 55.5 (H)  pO2, Arterial Latest Ref Range: 83 - 108 mmHg 76 (L)  TCO2 Latest Ref Range: 22 - 32 mmol/L 21 (L)  Acid-base deficit Latest Ref Range: 0.0 - 2.0 mmol/L 10.0 (H)  Bicarbonate Latest Ref Range: 20.0 - 28.0 mmol/L 18.9 (L)  O2 Saturation Latest Units: % 91.0  Patient temperature Unknown 97.5 F  Collection site Unknown Radial
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
## 2020-10-20 NOTE — ED Notes (Addendum)
This RN to bedside for reassessment. Found patinet to be sitting up right in bed at 45 degrees, moaning and reporting 10/10 pain. This RN notes that patient is hypotensive at 85/43. MD made aware at this time. Pt is alert and oriented to self, state, city and year. Pt appears sleepy yet arousabe. Handoff provided to [place] with [facility] transfer. This RN remains at bedside at this time.
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
## 2020-10-20 NOTE — Progress Notes (Signed)
Spoke with Luna Fuse RN re PICC order.  Due to hypotension  BP 56/24 at 1400 per flowsheet.  Pt now on pressors.  Pt has 2 PIV at this time.  Will plan on PICC placement 10/24/20 if VSS.  If emergent access needed, recommend placing a CVC.

## 2020-10-20 NOTE — Anesthesia Procedure Notes (Signed)
Procedure Name: Intubation Date/Time: 10/28/2020 1:31 PM Performed by: Cleda Daub, CRNA Pre-anesthesia Checklist: Patient identified, Emergency Drugs available, Suction available, Patient being monitored and Timeout performed Patient Re-evaluated:Patient Re-evaluated prior to induction Oxygen Delivery Method: Circle system utilized Preoxygenation: Pre-oxygenation with 100% oxygen Induction Type: IV induction, Rapid sequence and Cricoid Pressure applied Grade View: Grade I Tube type: Oral Tube size: 7.0 mm Number of attempts: 1 Airway Equipment and Method: Stylet and Video-laryngoscopy Placement Confirmation: ETT inserted through vocal cords under direct vision,  positive ETCO2 and breath sounds checked- equal and bilateral Secured at: 21 cm Tube secured with: Tape Dental Injury: Teeth and Oropharynx as per pre-operative assessment  Comments: Dentures removed and left them at the bedside prior to intubation.

## 2020-10-20 NOTE — ED Notes (Signed)
Patient off unit at this time to CT.

## 2020-10-21 ENCOUNTER — Other Ambulatory Visit (HOSPITAL_COMMUNITY): Payer: Medicare Other

## 2020-10-21 ENCOUNTER — Inpatient Hospital Stay (HOSPITAL_COMMUNITY): Payer: Medicare Other

## 2020-10-21 DIAGNOSIS — J9601 Acute respiratory failure with hypoxia: Secondary | ICD-10-CM | POA: Diagnosis not present

## 2020-10-21 DIAGNOSIS — J441 Chronic obstructive pulmonary disease with (acute) exacerbation: Secondary | ICD-10-CM

## 2020-10-21 DIAGNOSIS — J84112 Idiopathic pulmonary fibrosis: Secondary | ICD-10-CM

## 2020-10-21 DIAGNOSIS — J690 Pneumonitis due to inhalation of food and vomit: Secondary | ICD-10-CM | POA: Diagnosis not present

## 2020-10-21 LAB — POCT I-STAT 7, (LYTES, BLD GAS, ICA,H+H)
Acid-base deficit: 13 mmol/L — ABNORMAL HIGH (ref 0.0–2.0)
Acid-base deficit: 5 mmol/L — ABNORMAL HIGH (ref 0.0–2.0)
Acid-base deficit: 13 mmol/L — ABNORMAL HIGH (ref 0.0–2.0)
Acid-base deficit: 16 mmol/L — ABNORMAL HIGH (ref 0.0–2.0)
Acid-base deficit: 19 mmol/L — ABNORMAL HIGH (ref 0.0–2.0)
Bicarbonate: 15 mmol/L — ABNORMAL LOW (ref 20.0–28.0)
Bicarbonate: 21.8 mmol/L (ref 20.0–28.0)
Bicarbonate: 15.4 mmol/L — ABNORMAL LOW (ref 20.0–28.0)
Bicarbonate: 11.6 mmol/L — ABNORMAL LOW (ref 20.0–28.0)
Bicarbonate: 10.1 mmol/L — ABNORMAL LOW (ref 20.0–28.0)
Calcium, Ion: 1.04 mmol/L — ABNORMAL LOW (ref 1.15–1.40)
Calcium, Ion: 0.93 mmol/L — ABNORMAL LOW (ref 1.15–1.40)
Calcium, Ion: 0.95 mmol/L — ABNORMAL LOW (ref 1.15–1.40)
Calcium, Ion: 0.82 mmol/L — CL (ref 1.15–1.40)
Calcium, Ion: 0.68 mmol/L — CL (ref 1.15–1.40)
HCT: 20 % — ABNORMAL LOW (ref 36.0–46.0)
HCT: 16 % — ABNORMAL LOW (ref 36.0–46.0)
HCT: 20 % — ABNORMAL LOW (ref 36.0–46.0)
HCT: 19 % — ABNORMAL LOW (ref 36.0–46.0)
HCT: 24 % — ABNORMAL LOW (ref 36.0–46.0)
Hemoglobin: 6.8 g/dL — CL (ref 12.0–15.0)
Hemoglobin: 5.4 g/dL — CL (ref 12.0–15.0)
Hemoglobin: 6.8 g/dL — CL (ref 12.0–15.0)
Hemoglobin: 6.5 g/dL — CL (ref 12.0–15.0)
Hemoglobin: 8.2 g/dL — ABNORMAL LOW (ref 12.0–15.0)
O2 Saturation: 100 %
O2 Saturation: 100 %
O2 Saturation: 98 %
O2 Saturation: 96 %
O2 Saturation: 100 %
Patient temperature: 98.5
Patient temperature: 98
Patient temperature: 98.5
Patient temperature: 96.7
Patient temperature: 97.4
Potassium: 4.8 mmol/L (ref 3.5–5.1)
Potassium: 4.6 mmol/L (ref 3.5–5.1)
Potassium: 4.4 mmol/L (ref 3.5–5.1)
Potassium: 5.2 mmol/L — ABNORMAL HIGH (ref 3.5–5.1)
Potassium: 5.3 mmol/L — ABNORMAL HIGH (ref 3.5–5.1)
Sodium: 142 mmol/L (ref 135–145)
Sodium: 148 mmol/L — ABNORMAL HIGH (ref 135–145)
Sodium: 145 mmol/L (ref 135–145)
Sodium: 144 mmol/L (ref 135–145)
Sodium: 145 mmol/L (ref 135–145)
TCO2: 16 mmol/L — ABNORMAL LOW (ref 22–32)
TCO2: 23 mmol/L (ref 22–32)
TCO2: 17 mmol/L — ABNORMAL LOW (ref 22–32)
TCO2: 13 mmol/L — ABNORMAL LOW (ref 22–32)
TCO2: 11 mmol/L — ABNORMAL LOW (ref 22–32)
pCO2 arterial: 44.1 mmHg (ref 32.0–48.0)
pCO2 arterial: 52.7 mmHg — ABNORMAL HIGH (ref 32.0–48.0)
pCO2 arterial: 44.9 mmHg (ref 32.0–48.0)
pCO2 arterial: 34.3 mmHg (ref 32.0–48.0)
pCO2 arterial: 35.8 mmHg (ref 32.0–48.0)
pH, Arterial: 7.138 — CL (ref 7.350–7.450)
pH, Arterial: 7.222 — ABNORMAL LOW (ref 7.350–7.450)
pH, Arterial: 7.145 — CL (ref 7.350–7.450)
pH, Arterial: 7.129 — CL (ref 7.350–7.450)
pH, Arterial: 7.052 — CL (ref 7.350–7.450)
pO2, Arterial: 233 mmHg — ABNORMAL HIGH (ref 83.0–108.0)
pO2, Arterial: 300 mmHg — ABNORMAL HIGH (ref 83.0–108.0)
pO2, Arterial: 131 mmHg — ABNORMAL HIGH (ref 83.0–108.0)
pO2, Arterial: 99 mmHg (ref 83.0–108.0)
pO2, Arterial: 285 mmHg — ABNORMAL HIGH (ref 83.0–108.0)

## 2020-10-21 LAB — CBC
HCT: 23.4 % — ABNORMAL LOW (ref 36.0–46.0)
HCT: 23.5 % — ABNORMAL LOW (ref 36.0–46.0)
HCT: 23.6 % — ABNORMAL LOW (ref 36.0–46.0)
HCT: 29 % — ABNORMAL LOW (ref 36.0–46.0)
Hemoglobin: 7.1 g/dL — ABNORMAL LOW (ref 12.0–15.0)
Hemoglobin: 7.2 g/dL — ABNORMAL LOW (ref 12.0–15.0)
Hemoglobin: 7.1 g/dL — ABNORMAL LOW (ref 12.0–15.0)
Hemoglobin: 8.9 g/dL — ABNORMAL LOW (ref 12.0–15.0)
MCH: 33.6 pg (ref 26.0–34.0)
MCH: 33.3 pg (ref 26.0–34.0)
MCH: 32.3 pg (ref 26.0–34.0)
MCH: 31.9 pg (ref 26.0–34.0)
MCHC: 30.3 g/dL (ref 30.0–36.0)
MCHC: 30.6 g/dL (ref 30.0–36.0)
MCHC: 30.1 g/dL (ref 30.0–36.0)
MCHC: 30.7 g/dL (ref 30.0–36.0)
MCV: 110.9 fL — ABNORMAL HIGH (ref 80.0–100.0)
MCV: 108.8 fL — ABNORMAL HIGH (ref 80.0–100.0)
MCV: 107.3 fL — ABNORMAL HIGH (ref 80.0–100.0)
MCV: 103.9 fL — ABNORMAL HIGH (ref 80.0–100.0)
Platelets: 228 10*3/uL (ref 150–400)
Platelets: 204 10*3/uL (ref 150–400)
Platelets: 170 10*3/uL (ref 150–400)
Platelets: 165 10*3/uL (ref 150–400)
RBC: 2.11 MIL/uL — ABNORMAL LOW (ref 3.87–5.11)
RBC: 2.16 MIL/uL — ABNORMAL LOW (ref 3.87–5.11)
RBC: 2.2 MIL/uL — ABNORMAL LOW (ref 3.87–5.11)
RBC: 2.79 MIL/uL — ABNORMAL LOW (ref 3.87–5.11)
RDW: 15.9 % — ABNORMAL HIGH (ref 11.5–15.5)
RDW: 16.2 % — ABNORMAL HIGH (ref 11.5–15.5)
RDW: 17.6 % — ABNORMAL HIGH (ref 11.5–15.5)
RDW: 17.6 % — ABNORMAL HIGH (ref 11.5–15.5)
WBC: 19.3 10*3/uL — ABNORMAL HIGH (ref 4.0–10.5)
WBC: 20.8 10*3/uL — ABNORMAL HIGH (ref 4.0–10.5)
WBC: 19.6 10*3/uL — ABNORMAL HIGH (ref 4.0–10.5)
WBC: 16.5 10*3/uL — ABNORMAL HIGH (ref 4.0–10.5)
nRBC: 0 % (ref 0.0–0.2)
nRBC: 0.1 % (ref 0.0–0.2)
nRBC: 0.2 % (ref 0.0–0.2)
nRBC: 1 % — ABNORMAL HIGH (ref 0.0–0.2)

## 2020-10-21 LAB — COMPREHENSIVE METABOLIC PANEL
ALT: 278 U/L — ABNORMAL HIGH (ref 0–44)
AST: 442 U/L — ABNORMAL HIGH (ref 15–41)
Albumin: 2.1 g/dL — ABNORMAL LOW (ref 3.5–5.0)
Alkaline Phosphatase: 62 U/L (ref 38–126)
Anion gap: 12 (ref 5–15)
BUN: 16 mg/dL (ref 8–23)
CO2: 16 mmol/L — ABNORMAL LOW (ref 22–32)
Calcium: 6.5 mg/dL — ABNORMAL LOW (ref 8.9–10.3)
Chloride: 115 mmol/L — ABNORMAL HIGH (ref 98–111)
Creatinine, Ser: 1.01 mg/dL — ABNORMAL HIGH (ref 0.44–1.00)
GFR, Estimated: 57 mL/min — ABNORMAL LOW (ref >60)
Glucose, Bld: 88 mg/dL (ref 70–99)
Potassium: 4.5 mmol/L (ref 3.5–5.1)
Sodium: 143 mmol/L (ref 135–145)
Total Bilirubin: 1.7 mg/dL — ABNORMAL HIGH (ref 0.3–1.2)
Total Protein: 3.9 g/dL — ABNORMAL LOW (ref 6.5–8.1)

## 2020-10-21 LAB — PROTIME-INR
INR: 3 — ABNORMAL HIGH (ref 0.8–1.2)
INR: 2.3 — ABNORMAL HIGH (ref 0.8–1.2)
INR: 2.3 — ABNORMAL HIGH (ref 0.8–1.2)
Prothrombin Time: 29.9 seconds — ABNORMAL HIGH (ref 11.4–15.2)
Prothrombin Time: 24.7 seconds — ABNORMAL HIGH (ref 11.4–15.2)
Prothrombin Time: 24.5 seconds — ABNORMAL HIGH (ref 11.4–15.2)

## 2020-10-21 LAB — BASIC METABOLIC PANEL
Anion gap: 26 — ABNORMAL HIGH (ref 5–15)
BUN: 15 mg/dL (ref 8–23)
CO2: 10 mmol/L — ABNORMAL LOW (ref 22–32)
Calcium: 5.9 mg/dL — CL (ref 8.9–10.3)
Chloride: 112 mmol/L — ABNORMAL HIGH (ref 98–111)
Creatinine, Ser: 1.23 mg/dL — ABNORMAL HIGH (ref 0.44–1.00)
GFR, Estimated: 45 mL/min — ABNORMAL LOW (ref >60)
Glucose, Bld: 88 mg/dL (ref 70–99)
Potassium: 5.2 mmol/L — ABNORMAL HIGH (ref 3.5–5.1)
Sodium: 148 mmol/L — ABNORMAL HIGH (ref 135–145)

## 2020-10-21 LAB — DIC (DISSEMINATED INTRAVASCULAR COAGULATION)PANEL
D-Dimer, Quant: >20 ug/mL-FEU — ABNORMAL HIGH (ref 0.00–0.50)
Fibrinogen: 135 mg/dL — ABNORMAL LOW (ref 210–475)
INR: 2.3 — ABNORMAL HIGH (ref 0.8–1.2)
Platelets: 151 10*3/uL (ref 150–400)
Prothrombin Time: 24.5 seconds — ABNORMAL HIGH (ref 11.4–15.2)
Smear Review: NONE SEEN
aPTT: 59 seconds — ABNORMAL HIGH (ref 24–36)

## 2020-10-21 LAB — TRAUMA TEG PANEL
CFF Max Amplitude: 16 mm (ref 15–32)
CFF Max Amplitude: 16.9 mm (ref 15–32)
Citrated Kaolin (R): 6.5 min (ref 4.6–9.1)
Citrated Kaolin (R): 6.7 min (ref 4.6–9.1)
Citrated Rapid TEG (MA): 53.6 mm (ref 52–70)
Citrated Rapid TEG (MA): 57.6 mm (ref 52–70)
Lysis at 30 Minutes: 0 % (ref 0.0–2.6)
Lysis at 30 Minutes: 0 % (ref 0.0–2.6)

## 2020-10-21 LAB — TRIGLYCERIDES: Triglycerides: 25 mg/dL (ref <150)

## 2020-10-21 LAB — BLOOD GAS, ARTERIAL
Acid-base deficit: 18.5 mmol/L — ABNORMAL HIGH (ref 0.0–2.0)
Bicarbonate: 10 mmol/L — ABNORMAL LOW (ref 20.0–28.0)
FIO2: 100
O2 Saturation: 98.7 %
Patient temperature: 36.5
pCO2 arterial: 37.9 mmHg (ref 32.0–48.0)
pH, Arterial: 7.045 — CL (ref 7.350–7.450)
pO2, Arterial: 231 mmHg — ABNORMAL HIGH (ref 83.0–108.0)

## 2020-10-21 LAB — CBC WITH DIFFERENTIAL/PLATELET
Abs Immature Granulocytes: 0.13 10*3/uL — ABNORMAL HIGH (ref 0.00–0.07)
Basophils Absolute: 0 10*3/uL (ref 0.0–0.1)
Basophils Relative: 0 %
Eosinophils Absolute: 0 10*3/uL (ref 0.0–0.5)
Eosinophils Relative: 0 %
HCT: 26.5 % — ABNORMAL LOW (ref 36.0–46.0)
Hemoglobin: 8 g/dL — ABNORMAL LOW (ref 12.0–15.0)
Immature Granulocytes: 1 %
Lymphocytes Relative: 14 %
Lymphs Abs: 1.7 10*3/uL (ref 0.7–4.0)
MCH: 31.1 pg (ref 26.0–34.0)
MCHC: 30.2 g/dL (ref 30.0–36.0)
MCV: 103.1 fL — ABNORMAL HIGH (ref 80.0–100.0)
Monocytes Absolute: 0.8 10*3/uL (ref 0.1–1.0)
Monocytes Relative: 7 %
Neutro Abs: 9.3 10*3/uL — ABNORMAL HIGH (ref 1.7–7.7)
Neutrophils Relative %: 78 %
Platelets: 137 10*3/uL — ABNORMAL LOW (ref 150–400)
RBC: 2.57 MIL/uL — ABNORMAL LOW (ref 3.87–5.11)
RDW: 17 % — ABNORMAL HIGH (ref 11.5–15.5)
WBC: 12 10*3/uL — ABNORMAL HIGH (ref 4.0–10.5)
nRBC: 0.5 % — ABNORMAL HIGH (ref 0.0–0.2)

## 2020-10-21 LAB — PREPARE RBC (CROSSMATCH)

## 2020-10-21 LAB — LACTIC ACID, PLASMA
Lactic Acid, Venous: 7.6 mmol/L (ref 0.5–1.9)
Lactic Acid, Venous: 10.2 mmol/L (ref 0.5–1.9)
Lactic Acid, Venous: >11 mmol/L (ref 0.5–1.9)
Lactic Acid, Venous: >11 mmol/L (ref 0.5–1.9)

## 2020-10-21 LAB — APTT: aPTT: 79 seconds — ABNORMAL HIGH (ref 24–36)

## 2020-10-21 LAB — HEMOGLOBIN AND HEMATOCRIT, BLOOD
HCT: 26.3 % — ABNORMAL LOW (ref 36.0–46.0)
Hemoglobin: 8.2 g/dL — ABNORMAL LOW (ref 12.0–15.0)

## 2020-10-21 LAB — GLUCOSE, CAPILLARY: Glucose-Capillary: 70 mg/dL (ref 70–99)

## 2020-10-21 LAB — TROPONIN I (HIGH SENSITIVITY): Troponin I (High Sensitivity): 66 ng/L — ABNORMAL HIGH (ref <18)

## 2020-10-21 MED ORDER — SODIUM BICARBONATE 8.4 % IV SOLN
50.0000 meq | Freq: Once | INTRAVENOUS | Status: AC
  Administered 2020-10-21: 50 meq via INTRAVENOUS

## 2020-10-21 MED ORDER — ALBUTEROL SULFATE (2.5 MG/3ML) 0.083% IN NEBU: 2.5000 mg | INHALATION_SOLUTION | RESPIRATORY_TRACT | Status: DC | PRN

## 2020-10-21 MED ORDER — VASOPRESSIN 20 UNITS/100 ML INFUSION FOR SHOCK
0.0000 [IU]/min | INTRAVENOUS | Status: DC
  Administered 2020-10-21 (×2): 0.03 [IU]/min via INTRAVENOUS
  Filled 2020-10-21 (×3): qty 100

## 2020-10-21 MED ORDER — STERILE WATER FOR INJECTION IV SOLN
INTRAVENOUS | Status: DC
  Filled 2020-10-21 (×2): qty 850

## 2020-10-21 MED ORDER — PIPERACILLIN-TAZOBACTAM 3.375 G IVPB: 3.3750 g | Freq: Three times a day (TID) | INTRAVENOUS | Status: DC

## 2020-10-21 MED ORDER — CALCIUM GLUCONATE-NACL 1-0.675 GM/50ML-% IV SOLN
1.0000 g | Freq: Once | INTRAVENOUS | Status: AC
  Administered 2020-10-21: 1000 mg via INTRAVENOUS
  Filled 2020-10-21: qty 50

## 2020-10-21 MED ORDER — PIPERACILLIN-TAZOBACTAM 3.375 G IVPB 30 MIN
3.3750 g | Freq: Once | INTRAVENOUS | Status: AC
  Administered 2020-10-21: 3.375 g via INTRAVENOUS
  Filled 2020-10-21: qty 50

## 2020-10-21 MED ORDER — SODIUM CHLORIDE 0.9 % IV SOLN
8.0000 mg/h | INTRAVENOUS | Status: DC
  Administered 2020-10-21: 8 mg/h via INTRAVENOUS
  Filled 2020-10-21 (×3): qty 80

## 2020-10-21 MED ORDER — CALCIUM GLUCONATE-NACL 1-0.675 GM/50ML-% IV SOLN
INTRAVENOUS | Status: AC
  Filled 2020-10-21: qty 50

## 2020-10-21 MED ORDER — ARFORMOTEROL TARTRATE 15 MCG/2ML IN NEBU
15.0000 ug | INHALATION_SOLUTION | Freq: Two times a day (BID) | RESPIRATORY_TRACT | Status: DC
  Administered 2020-10-21 (×2): 15 ug via RESPIRATORY_TRACT
  Filled 2020-10-21 (×3): qty 2

## 2020-10-21 MED ORDER — SODIUM CHLORIDE 0.9 % IV BOLUS
1000.0000 mL | Freq: Once | INTRAVENOUS | Status: AC
  Administered 2020-10-21: 1000 mL via INTRAVENOUS

## 2020-10-21 MED ORDER — REVEFENACIN 175 MCG/3ML IN SOLN
175.0000 ug | Freq: Every day | RESPIRATORY_TRACT | Status: DC
  Administered 2020-10-21: 175 ug via RESPIRATORY_TRACT
  Filled 2020-10-21: qty 3

## 2020-10-21 MED ORDER — SODIUM BICARBONATE 8.4 % IV SOLN
50.0000 meq | Freq: Once | INTRAVENOUS | Status: AC
  Administered 2020-10-21: 50 meq via INTRAVENOUS
  Filled 2020-10-21: qty 50

## 2020-10-21 MED ORDER — METHYLPREDNISOLONE SODIUM SUCC 40 MG IJ SOLR
40.0000 mg | Freq: Three times a day (TID) | INTRAMUSCULAR | Status: DC
  Administered 2020-10-21: 40 mg via INTRAVENOUS
  Filled 2020-10-21: qty 1

## 2020-10-21 MED ORDER — CALCIUM GLUCONATE-NACL 1-0.675 GM/50ML-% IV SOLN
1.0000 g | Freq: Once | INTRAVENOUS | Status: AC
  Administered 2020-10-21: 1000 mg via INTRAVENOUS

## 2020-10-21 MED ORDER — PHENYLEPHRINE CONCENTRATED 100MG/250ML (0.4 MG/ML) INFUSION SIMPLE
0.0000 ug/min | INTRAVENOUS | Status: DC
  Administered 2020-10-21: 130 ug/min via INTRAVENOUS
  Administered 2020-10-21: 400 ug/min via INTRAVENOUS
  Filled 2020-10-21 (×3): qty 250

## 2020-10-21 MED ORDER — SODIUM BICARBONATE 8.4 % IV SOLN
100.0000 meq | Freq: Once | INTRAVENOUS | Status: DC
  Filled 2020-10-21: qty 100

## 2020-10-21 MED ORDER — BUDESONIDE 0.5 MG/2ML IN SUSP
0.5000 mg | Freq: Two times a day (BID) | RESPIRATORY_TRACT | Status: DC
  Administered 2020-10-21 (×2): 0.5 mg via RESPIRATORY_TRACT
  Filled 2020-10-21 (×2): qty 2

## 2020-10-21 MED ORDER — HYDROCORTISONE NA SUCCINATE PF 100 MG IJ SOLR
100.0000 mg | Freq: Three times a day (TID) | INTRAMUSCULAR | Status: DC
  Administered 2020-10-21: 100 mg via INTRAVENOUS
  Filled 2020-10-21: qty 2

## 2020-10-21 MED ORDER — SODIUM CHLORIDE 0.9% IV SOLUTION: Freq: Once | INTRAVENOUS | Status: DC

## 2020-10-21 MED ORDER — STERILE WATER FOR INJECTION IV SOLN
Freq: Once | INTRAVENOUS | Status: AC
  Filled 2020-10-21: qty 850

## 2020-10-21 MED ORDER — PHENYLEPHRINE HCL-NACL 10-0.9 MG/250ML-% IV SOLN
0.0000 ug/min | INTRAVENOUS | Status: DC
  Administered 2020-10-21: 20 ug/min via INTRAVENOUS
  Administered 2020-10-21: 50 ug/min via INTRAVENOUS
  Administered 2020-10-21: 100 ug/min via INTRAVENOUS
  Filled 2020-10-21 (×3): qty 250

## 2020-10-21 MED ORDER — SODIUM BICARBONATE 8.4 % IV SOLN
INTRAVENOUS | Status: AC
  Filled 2020-10-21: qty 50

## 2020-10-21 MED ORDER — SODIUM CHLORIDE 0.9 % IV BOLUS
500.0000 mL | Freq: Once | INTRAVENOUS | Status: AC
  Administered 2020-10-21: 500 mL via INTRAVENOUS

## 2020-10-21 MED ORDER — SODIUM CHLORIDE 0.9 % IV SOLN: INTRAVENOUS | Status: DC | PRN

## 2020-10-21 MED ORDER — DEXTROSE-NACL 5-0.9 % IV SOLN: INTRAVENOUS | Status: DC

## 2020-10-21 MED ORDER — SODIUM CHLORIDE 0.9 % IV SOLN
80.0000 mg | Freq: Once | INTRAVENOUS | Status: AC
  Administered 2020-10-21: 80 mg via INTRAVENOUS
  Filled 2020-10-21: qty 80

## 2020-10-21 MED ORDER — PANTOPRAZOLE SODIUM 40 MG IV SOLR: 40.0000 mg | Freq: Two times a day (BID) | INTRAVENOUS | Status: DC

## 2020-10-21 MED ORDER — SODIUM CHLORIDE 0.9% IV SOLUTION: Freq: Once | INTRAVENOUS | Status: AC

## 2020-10-22 LAB — PREPARE FRESH FROZEN PLASMA
Unit division: 0
Unit division: 0
Unit division: 0
Unit division: 0

## 2020-10-22 LAB — BPAM FFP
Blood Product Expiration Date: 202111172359
Blood Product Expiration Date: 202111172359
Blood Product Expiration Date: 202111192359
Blood Product Expiration Date: 202111192359
ISSUE DATE / TIME: 202111140924
ISSUE DATE / TIME: 202111140924
ISSUE DATE / TIME: 202111141325
ISSUE DATE / TIME: 202111141325
Unit Type and Rh: 6200
Unit Type and Rh: 6200
Unit Type and Rh: 5100
Unit Type and Rh: 9500

## 2020-10-22 LAB — PREPARE PLATELET PHERESIS: Unit division: 0

## 2020-10-22 LAB — TYPE AND SCREEN
ABO/RH(D): O POS
Antibody Screen: NEGATIVE
Unit division: 0
Unit division: 0
Unit division: 0
Unit division: 0
Unit division: 0
Unit division: 0

## 2020-10-22 LAB — BPAM RBC
Blood Product Expiration Date: 202112142359
Blood Product Expiration Date: 202112172359
Blood Product Expiration Date: 202112172359
Blood Product Expiration Date: 202112172359
Blood Product Expiration Date: 202112172359
Blood Product Expiration Date: 202112172359
ISSUE DATE / TIME: 202111140434
ISSUE DATE / TIME: 202111140923
ISSUE DATE / TIME: 202111141246
ISSUE DATE / TIME: 202111141246
Unit Type and Rh: 5100
Unit Type and Rh: 5100
Unit Type and Rh: 5100
Unit Type and Rh: 5100
Unit Type and Rh: 5100
Unit Type and Rh: 5100

## 2020-10-22 LAB — BPAM PLATELET PHERESIS
Blood Product Expiration Date: 202111142359
ISSUE DATE / TIME: 202111141247
Unit Type and Rh: 6200

## 2020-10-22 SURGERY — ESOPHAGOGASTRODUODENOSCOPY (EGD) WITH PROPOFOL
Anesthesia: Monitor Anesthesia Care | Laterality: Left

## 2020-10-24 LAB — CULTURE, RESPIRATORY W GRAM STAIN

## 2020-10-26 LAB — CULTURE, BLOOD (ROUTINE X 2)
Culture: NO GROWTH
Culture: NO GROWTH
Special Requests: ADEQUATE

## 2020-11-07 NOTE — Progress Notes (Signed)
RT obtained ABG on pt with the following results. MD Eltaraboulsi of critical pH of 7.138. No changes at this time. RT will continue to monitor.   Results for [name], [name] (MRN [id number]) as of 10/29/2020 03:25  Ref. Range 29-Oct-2020 02:16  Sample type Unknown ARTERIAL  pH, Arterial Latest Ref Range: 7.35 - 7.45  7.138 (LL)  pCO2 arterial Latest Ref Range: 32 - 48 mmHg 44.1  pO2, Arterial Latest Ref Range: 83 - 108 mmHg 233 (H)  TCO2 Latest Ref Range: 22 - 32 mmol/L 16 (L)  Acid-base deficit Latest Ref Range: 0.0 - 2.0 mmol/L 13.0 (H)  Bicarbonate Latest Ref Range: 20.0 - 28.0 mmol/L 15.0 (L)  O2 Saturation Latest Units: % 100.0  Patient temperature Unknown 98.5 F  Collection site Unknown Radial

## 2020-11-07 NOTE — Progress Notes (Addendum)
During time in which patient was unstable, multiple blood products were transfused.  Platelets W2399 21 [id number] J  Plasma W2399 21 [id number] (started at 1330) W2399 21 [id number] (started at 1333)  PRBC W2399 21 [id number] Q W2399 21 [id number] 0 (started at 1258)  PRBC and Plasma were transfused using the [employer] Rapid Transfuser and FFP was rapidly transfused at a rate of 97mL/hr.

## 2020-11-07 NOTE — Progress Notes (Signed)
Per nurse, family has requested extubation and discontinuation of vasopressors and continuation of morphine/pain medication for comfort.   Will follow their wishes.   Leighton Ruff. Redmond Pulling, MD, FACS General, Bariatric, & Minimally Invasive Surgery [facility] Surgery, [place]

## 2020-11-07 NOTE — Progress Notes (Signed)
   10/26/2020 1340  Clinical Encounter Type  Visited With Patient and family together  Visit Type Spiritual support  Referral From Nurse  Consult/Referral To Chaplain  Chaplain responded. The patient's daughter Katharine Look and the patient's husband Gwyndolyn Saxon were at bedside. The doctor is also at bedside in consultation with the family. The Chaplain provided presence and offered prayer.This note was prepared by Jeanine Luz, M.Div..  For questions please contact by phone [phone].

## 2020-11-07 NOTE — Progress Notes (Signed)
Developed worsening hypoxia, persistent acidosis and drop in Hb.  Concern for GI bleeding.  Trauma team ordered PRBC and PLT transfusion.  Also started on protonix gtt.  After rapid infusion of PRBC, oxygenation and BP improved.  Updated pt's family at bedside and discussed severity of current situation.  Explained that she is getting maximal therapy, but concern is she might continue to get worse.  Discussed scenario if she develops cardiac arrest and recommended against cardiac resuscitation if she gets to that point.  They will discuss this and notify the medical team once they make a decision.  D/w Dr. Redmond Pulling.  Chesley Mires, MD [place] Pager - [phone] Nov 07, 2020, 1:08 PM

## 2020-11-07 NOTE — Progress Notes (Signed)
CRITICAL VALUE STICKER  CRITICAL VALUE: Calcium 5.9  RECEIVER (on-site recipient of call): Candy Sledge, RN  Protection NOTIFIED: 1500 11/02/20  MD NOTIFIED: Governor Rooks, MD  TIME OF NOTIFICATION: 1607  RESPONSE: 1g Calcium Gluconate

## 2020-11-07 NOTE — Progress Notes (Signed)
Around 1230 this RN noticed that patient sp02 not reading consistently, after troubleshooting spO2 reading in the 60s. RT called and to bedside to bag patient. CCM and Trauma MDs to bedside. Patient with persistently worsening hypotension, all vasopressor medications increased to maximum ordered rate. Rapidly transfused 2u PRBC, 2 FFP, 1 Platelets, see later note. Follow on labs sent: CBC, BMP, PT/INR, TEG, LA. Family at bedside and aware of patient status, GOC discussed and chaplain paged to offer comfort. Pt with new bloody output from OG tube, MDs at bedside and aware. Protonix bolus and follow on gtt started, GI consulted and to come to bedside to assess patient.  *ABG obtained and arterial saturation at 100.  Candy Sledge, RN

## 2020-11-07 NOTE — Progress Notes (Signed)
RT obtained ABG on pt with the following results and changes. MD [place] notified of abg results. RT will continue to monitor.   Results for [name], [name] (MRN [id number]) as of Oct 25, 2020 04:35  Ref. Range 10/25/20 03:44  Sample type Unknown ARTERIAL  pH, Arterial Latest Ref Range: 7.35 - 7.45  7.222 (L)  pCO2 arterial Latest Ref Range: 32 - 48 mmHg 52.7 (H)  pO2, Arterial Latest Ref Range: 83 - 108 mmHg 300 (H)  TCO2 Latest Ref Range: 22 - 32 mmol/L 23  Acid-base deficit Latest Ref Range: 0.0 - 2.0 mmol/L 5.0 (H)  Bicarbonate Latest Ref Range: 20.0 - 28.0 mmol/L 21.8  O2 Saturation Latest Units: % 100.0  Patient temperature Unknown 98.0 F  Collection site Unknown art line

## 2020-11-07 NOTE — Progress Notes (Signed)
Pt with significant drop in hgb overnight. Got 1u prbc On .multiple pressors INR now out  Repeat ct yesterday showed no hemoperitoneum  Not sure source of blood loss No blood in og No bloody stools Significant hematoma in LLE - but calf soft - but will ask ortho to reeval.  donot think all blood is in the LLE  Check TEG now Will transfuse 1u prbc and 2u ffp and repeat labs afterwards  Will Consider repeat ct abd/pel with iv contrast   Leighton Ruff. Redmond Pulling, MD, FACS General, Bariatric, & Minimally Invasive Surgery [facility] Surgery, [place]

## 2020-11-07 NOTE — Progress Notes (Signed)
   10/28/20 2135  Clinical Encounter Type  Visited With Patient and family together  Visit Type Death  Referral From Nurse  Consult/Referral To Chaplain  Spiritual Encounters  Spiritual Needs Prayer  Stress Factors  Family Stress Factors Loss  Chaplain responded to the page for Leslie. [place].  She came to end of life and family requested prayer.  When I arrived family was around the bed side of [name].  Offered prayer and emotional support.  Chaplain Tabbatha Bordelon [place] [phone]

## 2020-11-07 NOTE — Procedures (Signed)
Extubation Procedure Note  Patient Details:   Name: [name] DOB: 01-11-1941 MRN: [id number]   Airway Documentation:    Vent end date: November 13, 2020 Vent end time: 2103   Evaluation  O2 sats: stable throughout Complications: No apparent complications Patient did tolerate procedure well. Bilateral Breath Sounds: Rhonchi, Diminished   No  Pt compassionately extubated to RA per MD order.  Roby Lofts [place] Pederson RRT, RCP November 13, 2020, 9:08 PM

## 2020-11-07 NOTE — Progress Notes (Signed)
Patient spO2 not reading consistently, patient with poor peripheral perfusion. Patient O2 saturation 100 per ABG.  Candy Sledge, RN

## 2020-11-07 NOTE — Progress Notes (Signed)
Made trauma MD and CCM aware that patient's spouse has agreed to go comfort care. Family  at bedside. Currently awaiting comfort care orders.

## 2020-11-07 NOTE — Progress Notes (Signed)
Family decided for DNR status but continue current medical care.  Order placed for DNR.  Chesley Mires, MD [place] Pager - [phone] 2020-11-12, 3:04 PM

## 2020-11-07 NOTE — Progress Notes (Signed)
Event note  Started on vasopressin and phenylephrine Cbc showed hb 7.  abg 7.138/44/233/bicarb 15.   Aline placed discordant with bp cuff ; map aline 40's, bp cuff 50's Given 2 amps of bicarb and map on aline improved to 70's with push Bedside u/s prelim showed no acute cholecystitis Called husband mr [employer] and consented for blood transfusion Repeat abg 3:48 7.2/55/200's/bicarb   - will increase RR from 28 to 30 - pending transfusion 1 unit prbc - pending bicar gtt at 150 ml/hr - will repeat abg at 5am

## 2020-11-07 NOTE — Progress Notes (Signed)
Patient time of death at 2123. Time of death was pronounced by Donalda Ewings RN and Ciara Linens RN with family at bedside. Family has chose funeral home see postmortem flowsheet. Pt is a medical examiner case.   75 cc of fentanyl wasted in sink by Donalda Ewings RN with Delfin Gant RN.

## 2020-11-07 NOTE — Progress Notes (Signed)
Pt transported from 4N16 to CT and back without complication. Pt respiratory status stable throughout transport. RT will continue to monitor.
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
## 2020-11-07 NOTE — Progress Notes (Signed)
[facility] Progress Note Patient Name: [name] DOB: 1941-10-11 MRN: [id number]   Date of Service  2020-10-29  HPI/Events of Note  ABG on 60%/PRVC 30/TV 500/P 5 = 7.145/44.9/131.  eICU Interventions  Plan: 1. Increase PRVC rate to 35. 2. NaHCO3 100 meq IV now. 3. NaHCO3 IV infusion to run at 75 mL/hour. 4. Repeat ABG at 11 AM.      Intervention Category Major Interventions: Acid-Base disturbance - evaluation and management;Respiratory failure - evaluation and management  [name] 29-Oct-2020, 6:22 AM

## 2020-11-07 NOTE — Progress Notes (Signed)
RT obtained ABG on pt with the following results. Critical pH called to [place]. No changes at this time. RT will continue to monitor.   Results for MARLEAN, MORTELL (MRN [id number]) as of 11/07/2020 05:38  Ref. Range 07-Nov-2020 05:35  Sample type Unknown ARTERIAL  pH, Arterial Latest Ref Range: 7.35 - 7.45  7.145 (LL)  pCO2 arterial Latest Ref Range: 32 - 48 mmHg 44.9  pO2, Arterial Latest Ref Range: 83 - 108 mmHg 131 (H)  TCO2 Latest Ref Range: 22 - 32 mmol/L 17 (L)  Acid-base deficit Latest Ref Range: 0.0 - 2.0 mmol/L 13.0 (H)  Bicarbonate Latest Ref Range: 20.0 - 28.0 mmol/L 15.4 (L)  O2 Saturation Latest Units: % 98.0  Patient temperature Unknown 98.5 F  Collection site Unknown art line

## 2020-11-07 NOTE — Procedures (Signed)
Arterial Catheter Insertion Procedure Note  ABBIGAIL ANSTEY  [id number]  [birth year]  Date:November 04, 2020  Time:2:52 AM    Provider Performing: Garrel Ridgel    Procedure: Insertion of Arterial Line [phone]) with US guidance ([id number])   Indication(s) Blood pressure monitoring and/or need for frequent ABGs  Consent Risks of the procedure as well as the alternatives and risks of each were explained to the patient and/or caregiver.  Consent for the procedure was obtained and is signed in the bedside chart  Anesthesia None   Time Out Verified patient identification, verified procedure, site/side was marked, verified correct patient position, special equipment/implants available, medications/allergies/relevant history reviewed, required imaging and test results available.   Sterile Technique Maximal sterile technique including full sterile barrier drape, hand hygiene, sterile gown, sterile gloves, mask, hair covering, sterile ultrasound probe cover (if used).   Procedure Description Area of catheter insertion was cleaned with chlorhexidine and draped in sterile fashion. With real-time ultrasound guidance an arterial catheter was placed into the left radial artery.  Appropriate arterial tracings confirmed on monitor.     Complications/Tolerance None; patient tolerated the procedure well.   EBL Minimal   Specimen(s) None

## 2020-11-07 NOTE — TOC CAGE-AID Note (Signed)
Transition of Care [facility]) - CAGE-AID Screening   Patient Details  Name: [name] MRN: [id number] Date of Birth: 04/28/1941  [facility] CM/SW Contact:    Emeterio Reeve, [place] Phone Number: November 08, 2020, 3:29 PM   Clinical Narrative:  Pt is on ventilator and unable to participate in assessment.  CAGE-AID Screening: Substance Abuse Screening unable to be completed due to: : Patient unable to participate               [place] [place] Clinical Social Worker [phone]
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
## 2020-11-07 NOTE — Progress Notes (Addendum)
Patient ID: [name], female   DOB: 1940-12-10, 79 y.o.   MRN: [id number] Follow up - Trauma Critical Care  Patient Details:    [name] is an 79 y.o. female.  Lines/tubes : Airway 7 mm (Active)  Secured at (cm) 22 cm 10/23/20 1519  Measured From Lips 2020-10-23 [place] 2020-10-23 1519  Secured By [employer] 2020/10/23 1519  Tube Holder Repositioned Yes 2020-10-23 1519  Cuff Pressure (cm H2O) 28 cm H2O 10/23/2020 0806  Site Condition Dry 23-Oct-2020 1519     CVC Triple Lumen 11/01/2020 Right Internal jugular (Active)  Indication for Insertion or Continuance of Line Vasoactive infusions 10/23/20 0726  Site Assessment Clean;Dry;Intact 10/23/2020 0726  Proximal Lumen Status Infusing 23-Oct-2020 0726  Medial Lumen Status Infusing 23-Oct-2020 0726  Distal Lumen Status Infusing 2020/10/23 0726  Dressing Type Transparent;Occlusive Oct 23, 2020 0726  Dressing Status Clean;Dry;Intact 10-23-2020 0726  Antimicrobial disc in place? Yes 10-23-2020 0726  Line Care Connections checked and tightened 2020/10/23 0726  Dressing Change Due 10/27/20 10/23/20 0726     Arterial Line 10/23/2020 Left Radial (Active)  Site Assessment Clean;Dry;Intact October 23, 2020 0726  Line Status Pulsatile blood flow;Positional October 23, 2020 0726  Art Line Waveform Appropriate;Square wave test performed 2020-10-23 0726  Art Line Interventions Zeroed and calibrated;Connections checked and tightened 2020-10-23 0726  Color/Movement/Sensation Capillary refill less than 3 sec 10-23-20 0726  Dressing Type Transparent;Occlusive 10/23/20 0726  Dressing Status Clean;Dry;Intact;Antimicrobial disc in place 10/23/2020 0726  Dressing Change Due 10/28/20 10/23/20 0726     Chest Tube 1 Lateral;Left Pleural (Active)  Status -40 cm H2O 2020/10/23 0800  Chest Tube Air Leak Large 23-Oct-2020 0800  Patency Intervention Tip/tilt October 23, 2020 0800  Drainage Description Sanguineous 23-Oct-2020 0800  Dressing Status Old drainage;Dry;Intact Oct 23, 2020 0800   Dressing Intervention New dressing 10/16/2020 2000  Site Assessment Clean;Dry;Intact 10/18/2020 2000  Surrounding Skin Unable to view October 23, 2020 0800  Output (mL) 38 mL 10/23/2020 1757     NG/OG Tube Orogastric Center mouth (Active)  External Length of Tube (cm) - (if applicable) 60 cm 33/00/76 0800  Site Assessment Clean;Dry;Intact 10-23-2020 0800  Ongoing Placement Verification No change in cm markings or external length of tube from initial placement;No change in respiratory status;No acute changes, not attributed to clinical condition 10-23-2020 0800  Status Suction-low intermittent 10/23/2020 0800  Output (mL) 150 mL October 23, 2020 1500     Urethral Catheter M.Mead 14 Fr. (Active)  Indication for Insertion or Continuance of Catheter Unstable critically ill patients first 24-48 hours (See Criteria) 10-23-2020 0800  Site Assessment Clean;Intact Oct 23, 2020 0800  Catheter Maintenance Bag below level of bladder;Insertion date on drainage bag;Catheter secured;No dependent loops;Drainage bag/tubing not touching floor;Seal intact Oct 23, 2020 0800  Collection Container Standard drainage bag 10/23/20 0800  Securement Method Securing device (Describe) October 23, 2020 0800  Urinary Catheter Interventions (if applicable) Unclamped 22/63/33 0800  Output (mL) 60 mL 2020/10/23 1757    Microbiology/Sepsis markers: Results for orders placed or performed during the hospital encounter of 10/28/2020  Respiratory Panel by RT PCR (Flu A&B, Covid) - Nasopharyngeal Swab     Status: None   Collection Time: 10/14/2020  9:27 AM   Specimen: Nasopharyngeal Swab  Result Value Ref Range Status   SARS Coronavirus 2 by RT PCR NEGATIVE NEGATIVE Final    Comment: (NOTE) SARS-CoV-2 target nucleic acids are NOT DETECTED.  The SARS-CoV-2 RNA is generally detectable in upper respiratoy specimens during the acute phase of infection. The lowest concentration of SARS-CoV-2 viral copies this assay can detect is 131  copies/mL. A negative result does not  preclude SARS-Cov-2 infection and should not be used as the sole basis for treatment or other patient management decisions. A negative result may occur with  improper specimen collection/handling, submission of specimen other than nasopharyngeal swab, presence of viral mutation(s) within the areas targeted by this assay, and inadequate number of viral copies (<131 copies/mL). A negative result must be combined with clinical observations, patient history, and epidemiological information. The expected result is Negative.  Fact Sheet for Patients:  PinkCheek.be  Fact Sheet for Healthcare Providers:  GravelBags.it  This test is no t yet approved or cleared by the [place] FDA and  has been authorized for detection and/or diagnosis of SARS-CoV-2 by FDA under an Emergency Use Authorization (EUA). This EUA will remain  in effect (meaning this test can be used) for the duration of the COVID-19 declaration under Section 564(b)(1) of the Act, 21 U.S.C. section 360bbb-3(b)(1), unless the authorization is terminated or revoked sooner.     Influenza A by PCR NEGATIVE NEGATIVE Final   Influenza B by PCR NEGATIVE NEGATIVE Final    Comment: (NOTE) The Xpert Xpress SARS-CoV-2/FLU/RSV assay is intended as an aid in  the diagnosis of influenza from Nasopharyngeal swab specimens and  should not be used as a sole basis for treatment. Nasal washings and  aspirates are unacceptable for Xpert Xpress SARS-CoV-2/FLU/RSV  testing.  Fact Sheet for Patients: PinkCheek.be  Fact Sheet for Healthcare Providers: GravelBags.it  This test is not yet approved or cleared by the [place] FDA and  has been authorized for detection and/or diagnosis of SARS-CoV-2 by  FDA under an Emergency Use Authorization (EUA). This EUA will remain  in effect (meaning this test can be used) for the  duration of the  Covid-19 declaration under Section 564(b)(1) of the Act, 21  U.S.C. section 360bbb-3(b)(1), unless the authorization is  terminated or revoked. Performed at [facility], [address], [place], [place] [address]   MRSA PCR Screening     Status: None   Collection Time: 10/27/2020  1:33 PM   Specimen: Nasal Mucosa; Nasopharyngeal  Result Value Ref Range Status   MRSA by PCR NEGATIVE NEGATIVE Final    Comment:        The GeneXpert MRSA Assay (FDA approved for NASAL specimens only), is one component of a comprehensive MRSA colonization surveillance program. It is not intended to diagnose MRSA infection nor to guide or monitor treatment for MRSA infections. Performed at [facility], [place] [address] [place], [place] [address]   Culture, blood (routine x 2)     Status: None (Preliminary result)   Collection Time: 2020/11/12  5:23 AM   Specimen: BLOOD  Result Value Ref Range Status   Specimen Description BLOOD RIGHT ARM  Final   Special Requests   Final    BOTTLES DRAWN AEROBIC AND ANAEROBIC Blood Culture adequate volume   Culture   Final    NO GROWTH < 12 HOURS Performed at [facility], [place] [address], [place], [place] [address]    Report Status PENDING  Incomplete  Culture, blood (routine x 2)     Status: None (Preliminary result)   Collection Time: 11/12/20  5:24 AM   Specimen: BLOOD LEFT HAND  Result Value Ref Range Status   Specimen Description BLOOD LEFT HAND  Final   Special Requests   Final    BOTTLES DRAWN AEROBIC AND ANAEROBIC Blood Culture results may not be optimal due to  an inadequate volume of blood received in culture bottles   Culture   Final    NO GROWTH < 12 HOURS Performed at [facility] [address], [place], [place] [address]    Report Status PENDING  Incomplete  Culture, respiratory (non-expectorated)     Status: None (Preliminary result)   Collection Time: 19-Nov-2020  3:38 PM   Specimen: Endotracheal; Respiratory   Result Value Ref Range Status   Specimen Description ENDOTRACHEAL  Final   Special Requests NONE  Final   Gram Stain   Final    RARE WBC PRESENT,BOTH PMN AND MONONUCLEAR ABUNDANT GRAM NEGATIVE RODS RARE GRAM POSITIVE COCCI IN PAIRS Performed at [facility], [place] [address] [place], [place] [address]    Culture PENDING  Incomplete   Report Status PENDING  Incomplete    Anti-infectives:  Anti-infectives (From admission, onward)   Start     Dose/Rate Route Frequency Ordered Stop   19-Nov-2020 2000  piperacillin-tazobactam (ZOSYN) IVPB 3.375 g        3.375 g 12.5 mL/hr over 240 Minutes Intravenous Every 8 hours 2020-11-19 1426     2020/11/19 1530  piperacillin-tazobactam (ZOSYN) IVPB 3.375 g  Status:  Discontinued        3.375 g 12.5 mL/hr over 240 Minutes Intravenous Every 8 hours 2020-11-19 0908 November 19, 2020 1426   19-Nov-2020 1000  piperacillin-tazobactam (ZOSYN) IVPB 3.375 g        3.375 g 100 mL/hr over 30 Minutes Intravenous  Once 19-Nov-2020 0908 Nov 19, 2020 1444      Best Practice/Protocols:  VTE Prophylaxis: Mechanical GI Prophylaxis: Proton Pump Inhibitor Intermittent Sedation  Consults: Treatment Team:  Md, Trauma, MD Mcarthur Rossetti, MD Haddix, Thomasene Lot, MD Arta Silence, MD    Studies:    Events:  Subjective:    Overnight Issues:  Pt with persistent hypotension overnight, on pressors and did have drop in hgb and INR abnormal now; got 1u prbc earlier this am Objective:  Vital signs for last 24 hours: Temp:  [95 F (35 C)-98.2 F (36.8 C)] 97.6 F (36.4 C) (11/14 1500) Pulse Rate:  [12-147] 134 (11/14 1645) Resp:  [7-35] 20 (11/14 1645) BP: (70-134)/(22-115) 111/55 (11/14 1645) SpO2:  [15 %-100 %] 100 % (11/14 1645) Arterial Line BP: (49-116)/(25-79) 61/36 (11/14 1645) FiO2 (%):  [50 %-100 %] 100 % (11/14 1519)  Hemodynamic parameters for last 24 hours: CVP:  [11 mmHg-27 mmHg] 27 mmHg  Intake/Output from previous day: 11/13 0701 - 11/14 0700 In:  7336.4 [I.V.:4342.4; Blood:420; IV SWHQPRFFM:3846] Out: 900 [Urine:500; Emesis/NG output:400]  Intake/Output this shift: Total I/O In: 3116.3 [I.V.:2325.1; Blood:618.7; IV Piggyback:172.6] Out: 378 [Urine:190; Emesis/NG output:150; Chest Tube:38]  Vent settings for last 24 hours: Vent Mode: PCV FiO2 (%):  [50 %-100 %] 100 % Set Rate:  [16 bmp-35 bmp] 20 bmp Vt Set:  [410 mL-500 mL] 500 mL PEEP:  [5 cmH20-10 cmH20] 10 cmH20 Plateau Pressure:  [14 cmH20-22 cmH20] 22 cmH20  Physical Exam:  General: ill appearing Neuro: intubated. rass -2 HEENT/Neck: ETT Resp: poor air mvmt, no air leak in pleuravac CVS: reg, tachy GI: distended, soft, no HSM Skin: no rash Extremities: L knee immobilizer, bruising swelling hematoma LLE, but calf soft  Results for orders placed or performed during the hospital encounter of 10/15/2020 (from the past 24 hour(s))  Urinalysis, Routine w reflex microscopic     Status: Abnormal   Collection Time: 11/06/2020  6:56 PM  Result Value Ref Range   Color,  Urine AMBER (A) YELLOW   APPearance CLEAR CLEAR   Specific Gravity, Urine >1.046 (H) 1.005 - 1.030   pH 5.0 5.0 - 8.0   Glucose, UA NEGATIVE NEGATIVE mg/dL   Hgb urine dipstick MODERATE (A) NEGATIVE   Bilirubin Urine NEGATIVE NEGATIVE   Ketones, ur NEGATIVE NEGATIVE mg/dL   Protein, ur NEGATIVE NEGATIVE mg/dL   Nitrite NEGATIVE NEGATIVE   Leukocytes,Ua NEGATIVE NEGATIVE   RBC / HPF 0-5 0 - 5 RBC/hpf   WBC, UA 11-20 0 - 5 WBC/hpf   Bacteria, UA NONE SEEN NONE SEEN   Squamous Epithelial / LPF 0-5 0 - 5   Mucus PRESENT   Troponin I (High Sensitivity)     Status: Abnormal   Collection Time: 11/01/2020  7:01 PM  Result Value Ref Range   Troponin I (High Sensitivity) 34 (H) <18 ng/L  I-STAT 7, (LYTES, BLD GAS, ICA, H+H)     Status: Abnormal   Collection Time: 10/19/2020  7:39 PM  Result Value Ref Range   pH, Arterial 7.433 7.35 - 7.45   pCO2 arterial 21.3 (L) 32 - 48 mmHg   pO2, Arterial <15 (LL) 83 - 108 mmHg    Bicarbonate 20.0 20.0 - 28.0 mmol/L   TCO2 22 22 - 32 mmol/L   O2 Saturation 68.0 %   Acid-base deficit 9.0 (H) 0.0 - 2.0 mmol/L   Sodium 143 135 - 145 mmol/L   Potassium 4.5 3.5 - 5.1 mmol/L   Calcium, Ion 1.15 1.15 - 1.40 mmol/L   HCT 29.0 (L) 36 - 46 %   Hemoglobin 9.9 (L) 12.0 - 15.0 g/dL   Patient temperature 13.0 C    Sample type [employer]    Comment NOTIFIED PHYSICIAN   I-STAT 7, (LYTES, BLD GAS, ICA, H+H)     Status: Abnormal   Collection Time: 10/22/2020  7:46 PM  Result Value Ref Range   pH, Arterial 7.136 (LL) 7.35 - 7.45   pCO2 arterial 55.5 (H) 32 - 48 mmHg   pO2, Arterial 76 (L) 83 - 108 mmHg   Bicarbonate 18.9 (L) 20.0 - 28.0 mmol/L   TCO2 21 (L) 22 - 32 mmol/L   O2 Saturation 91.0 %   Acid-base deficit 10.0 (H) 0.0 - 2.0 mmol/L   Sodium 142 135 - 145 mmol/L   Potassium 4.6 3.5 - 5.1 mmol/L   Calcium, Ion 1.13 (L) 1.15 - 1.40 mmol/L   HCT 27.0 (L) 36 - 46 %   Hemoglobin 9.2 (L) 12.0 - 15.0 g/dL   Patient temperature 97.5 F    Collection site Radial    Drawn by RT    Sample type ARTERIAL    Comment NOTIFIED PHYSICIAN   Lactic acid, plasma     Status: Abnormal   Collection Time: Oct 23, 2020  2:16 AM  Result Value Ref Range   Lactic Acid, Venous 7.6 (HH) 0.5 - 1.9 mmol/L  CBC     Status: Abnormal   Collection Time: Oct 23, 2020  2:16 AM  Result Value Ref Range   WBC 19.3 (H) 4.0 - 10.5 K/uL   RBC 2.11 (L) 3.87 - 5.11 MIL/uL   Hemoglobin 7.1 (L) 12.0 - 15.0 g/dL   HCT 23.4 (L) 36 - 46 %   MCV 110.9 (H) 80.0 - 100.0 fL   MCH 33.6 26.0 - 34.0 pg   MCHC 30.3 30.0 - 36.0 g/dL   RDW 15.9 (H) 11.5 - 15.5 %   Platelets 228 150 - 400 K/uL   nRBC 0.0  0.0 - 0.2 %  I-STAT 7, (LYTES, BLD GAS, ICA, H+H)     Status: Abnormal   Collection Time: 07-Nov-2020  2:16 AM  Result Value Ref Range   pH, Arterial 7.138 (LL) 7.35 - 7.45   pCO2 arterial 44.1 32 - 48 mmHg   pO2, Arterial 233 (H) 83 - 108 mmHg   Bicarbonate 15.0 (L) 20.0 - 28.0 mmol/L   TCO2 16 (L) 22 - 32 mmol/L    O2 Saturation 100.0 %   Acid-base deficit 13.0 (H) 0.0 - 2.0 mmol/L   Sodium 142 135 - 145 mmol/L   Potassium 4.8 3.5 - 5.1 mmol/L   Calcium, Ion 1.04 (L) 1.15 - 1.40 mmol/L   HCT 20.0 (L) 36 - 46 %   Hemoglobin 6.8 (LL) 12.0 - 15.0 g/dL   Patient temperature 98.5 F    Collection site Radial    Drawn by RT    Sample type ARTERIAL    Comment NOTIFIED PHYSICIAN   Prepare RBC (crossmatch)     Status: None   Collection Time: November 07, 2020  3:00 AM  Result Value Ref Range   Order Confirmation      ORDER PROCESSED BY BLOOD BANK Performed at [facility], [address], [place], [place] [address]   Type and screen [place]     Status: None (Preliminary result)   Collection Time: 11-07-20  3:30 AM  Result Value Ref Range   ABO/RH(D) O POS    Antibody Screen NEG    Sample Expiration 10/24/2020,2359    Unit Number [id number]    Blood Component Type RED CELLS,LR    Unit division 00    Status of Unit ISSUED    Transfusion Status OK TO TRANSFUSE    Crossmatch Result Compatible    Unit Number [id number]    Blood Component Type RED CELLS,LR    Unit division 00    Status of Unit ISSUED    Transfusion Status OK TO TRANSFUSE    Crossmatch Result Compatible    Unit Number [id number]    Blood Component Type RED CELLS,LR    Unit division 00    Status of Unit ISSUED    Transfusion Status OK TO TRANSFUSE    Crossmatch Result Compatible    Unit Number [id number]    Blood Component Type RED CELLS,LR    Unit division 00    Status of Unit ISSUED    Transfusion Status OK TO TRANSFUSE    Crossmatch Result      Compatible Performed at [facility], [place] [address], [place], [place] [address]    Unit Number [id number]    Blood Component Type RED CELLS,LR    Unit division 00    Status of Unit ALLOCATED    Transfusion Status OK TO TRANSFUSE    Crossmatch Result Compatible    Unit Number [id number]    Blood Component Type RED CELLS,LR    Unit  division 00    Status of Unit ALLOCATED    Transfusion Status OK TO TRANSFUSE    Crossmatch Result Compatible   I-STAT 7, (LYTES, BLD GAS, ICA, H+H)     Status: Abnormal   Collection Time: November 07, 2020  3:44 AM  Result Value Ref Range   pH, Arterial 7.222 (L) 7.35 - 7.45   pCO2 arterial 52.7 (H) 32 - 48 mmHg   pO2, Arterial 300 (H) 83 - 108 mmHg   Bicarbonate 21.8 20.0 - 28.0 mmol/L   TCO2 23  22 - 32 mmol/L   O2 Saturation 100.0 %   Acid-base deficit 5.0 (H) 0.0 - 2.0 mmol/L   Sodium 148 (H) 135 - 145 mmol/L   Potassium 4.6 3.5 - 5.1 mmol/L   Calcium, Ion 0.93 (L) 1.15 - 1.40 mmol/L   HCT 16.0 (L) 36 - 46 %   Hemoglobin 5.4 (LL) 12.0 - 15.0 g/dL   Patient temperature 98.0 F    Collection site art line    Drawn by RT    Sample type ARTERIAL    Comment NOTIFIED PHYSICIAN   Triglycerides     Status: None   Collection Time: Oct 26, 2020  3:45 AM  Result Value Ref Range   Triglycerides 25 <150 mg/dL  Troponin I (High Sensitivity)     Status: Abnormal   Collection Time: 2020/10/26  3:45 AM  Result Value Ref Range   Troponin I (High Sensitivity) 66 (H) <18 ng/L  Culture, blood (routine x 2)     Status: None (Preliminary result)   Collection Time: 2020/10/26  5:23 AM   Specimen: BLOOD  Result Value Ref Range   Specimen Description BLOOD RIGHT ARM    Special Requests      BOTTLES DRAWN AEROBIC AND ANAEROBIC Blood Culture adequate volume   Culture      NO GROWTH < 12 HOURS Performed at [facility], [address], [place], [place] [address]    Report Status PENDING   Culture, blood (routine x 2)     Status: None (Preliminary result)   Collection Time: 2020-10-26  5:24 AM   Specimen: BLOOD LEFT HAND  Result Value Ref Range   Specimen Description BLOOD LEFT HAND    Special Requests      BOTTLES DRAWN AEROBIC AND ANAEROBIC Blood Culture results may not be optimal due to an inadequate volume of blood received in culture bottles   Culture      NO GROWTH < 12 HOURS Performed at [facility] [address], [place], [place] [address]    Report Status PENDING   CBC     Status: Abnormal   Collection Time: Oct 26, 2020  5:31 AM  Result Value Ref Range   WBC 20.8 (H) 4.0 - 10.5 K/uL   RBC 2.16 (L) 3.87 - 5.11 MIL/uL   Hemoglobin 7.2 (L) 12.0 - 15.0 g/dL   HCT 23.5 (L) 36 - 46 %   MCV 108.8 (H) 80.0 - 100.0 fL   MCH 33.3 26.0 - 34.0 pg   MCHC 30.6 30.0 - 36.0 g/dL   RDW 16.2 (H) 11.5 - 15.5 %   Platelets 204 150 - 400 K/uL   nRBC 0.1 0.0 - 0.2 %  Comprehensive metabolic panel     Status: Abnormal   Collection Time: 10-26-2020  5:31 AM  Result Value Ref Range   Sodium 143 135 - 145 mmol/L   Potassium 4.5 3.5 - 5.1 mmol/L   Chloride 115 (H) 98 - 111 mmol/L   CO2 16 (L) 22 - 32 mmol/L   Glucose, Bld 88 70 - 99 mg/dL   BUN 16 8 - 23 mg/dL   Creatinine, Ser 1.01 (H) 0.44 - 1.00 mg/dL   Calcium 6.5 (L) 8.9 - 10.3 mg/dL   Total Protein 3.9 (L) 6.5 - 8.1 g/dL   Albumin 2.1 (L) 3.5 - 5.0 g/dL   AST 442 (H) 15 - 41 U/L   ALT 278 (H) 0 - 44 U/L   Alkaline Phosphatase 62 38 - 126  U/L   Total Bilirubin 1.7 (H) 0.3 - 1.2 mg/dL   GFR, Estimated 57 (L) >60 mL/min   Anion gap 12 5 - 15  Protime-INR     Status: Abnormal   Collection Time: 10/27/20  5:31 AM  Result Value Ref Range   Prothrombin Time 29.9 (H) 11.4 - 15.2 seconds   INR 3.0 (H) 0.8 - 1.2  APTT     Status: Abnormal   Collection Time: Oct 27, 2020  5:31 AM  Result Value Ref Range   aPTT 79 (H) 24 - 36 seconds  Lactic acid, plasma     Status: Abnormal   Collection Time: 10/27/20  5:31 AM  Result Value Ref Range   Lactic Acid, Venous 10.2 (HH) 0.5 - 1.9 mmol/L  I-STAT 7, (LYTES, BLD GAS, ICA, H+H)     Status: Abnormal   Collection Time: October 27, 2020  5:35 AM  Result Value Ref Range   pH, Arterial 7.145 (LL) 7.35 - 7.45   pCO2 arterial 44.9 32 - 48 mmHg   pO2, Arterial 131 (H) 83 - 108 mmHg   Bicarbonate 15.4 (L) 20.0 - 28.0 mmol/L   TCO2 17 (L) 22 - 32 mmol/L   O2 Saturation 98.0 %   Acid-base deficit 13.0 (H)  0.0 - 2.0 mmol/L   Sodium 145 135 - 145 mmol/L   Potassium 4.4 3.5 - 5.1 mmol/L   Calcium, Ion 0.95 (L) 1.15 - 1.40 mmol/L   HCT 20.0 (L) 36 - 46 %   Hemoglobin 6.8 (LL) 12.0 - 15.0 g/dL   Patient temperature 98.5 F    Collection site [employer] features editor by RT    Sample type ARTERIAL    Comment NOTIFIED PHYSICIAN   Lactic acid, plasma     Status: Abnormal   Collection Time: 2020-10-27  7:44 AM  Result Value Ref Range   Lactic Acid, Venous >11.0 (HH) 0.5 - 1.9 mmol/L  Hemoglobin and hematocrit, blood     Status: Abnormal   Collection Time: Oct 27, 2020  7:44 AM  Result Value Ref Range   Hemoglobin 8.2 (L) 12.0 - 15.0 g/dL   HCT 26.3 (L) 36 - 46 %  Glucose, capillary     Status: None   Collection Time: October 27, 2020  7:49 AM  Result Value Ref Range   Glucose-Capillary 70 70 - 99 mg/dL  Prepare fresh frozen plasma     Status: None (Preliminary result)   Collection Time: 2020/10/27  8:44 AM  Result Value Ref Range   Unit Number [id number]    Blood Component Type THAWED PLASMA    Unit division 00    Status of Unit ISSUED    Transfusion Status OK TO TRANSFUSE    Unit Number [id number]    Blood Component Type THAWED PLASMA    Unit division 00    Status of Unit ISSUED    Transfusion Status      OK TO TRANSFUSE Performed at [facility], [address], [place], [place] [address]   Trauma TEG Panel     Status: None   Collection Time: 2020-10-27  8:48 AM  Result Value Ref Range   Citrated Kaolin (R) 6.5 4.6 - 9.1 min   Citrated Rapid TEG (MA) 53.6 52 - 70 mm   CFF Max Amplitude 16 15 - 32 mm   Lysis at 30 Minutes 0 0.0 - 2.6 %  Prepare RBC (crossmatch)     Status: None   Collection Time: 2020-10-27  8:51 AM  Result Value Ref Range   Order Confirmation      ORDER PROCESSED BY BLOOD BANK Performed at [facility], [place] [address], [place], [place] [address]   I-STAT 7, (LYTES, BLD GAS, ICA, H+H)     Status: Abnormal   Collection Time: 2020-11-03 12:19 PM  Result Value Ref  Range   pH, Arterial 7.129 (LL) 7.35 - 7.45   pCO2 arterial 34.3 32 - 48 mmHg   pO2, Arterial 99 83 - 108 mmHg   Bicarbonate 11.6 (L) 20.0 - 28.0 mmol/L   TCO2 13 (L) 22 - 32 mmol/L   O2 Saturation 96.0 %   Acid-base deficit 16.0 (H) 0.0 - 2.0 mmol/L   Sodium 144 135 - 145 mmol/L   Potassium 5.2 (H) 3.5 - 5.1 mmol/L   Calcium, Ion 0.82 (LL) 1.15 - 1.40 mmol/L   HCT 19.0 (L) 36 - 46 %   Hemoglobin 6.5 (LL) 12.0 - 15.0 g/dL   Patient temperature 96.7 F    Collection site Magazine features editor by RT    Sample type ARTERIAL    Comment NOTIFIED PHYSICIAN   Prepare RBC (crossmatch)     Status: None   Collection Time: 11-03-20 12:36 PM  Result Value Ref Range   Order Confirmation      ORDER PROCESSED BY BLOOD BANK Performed at [facility], [address], [place], [place] [address]   Prepare Pheresed Platelets     Status: None (Preliminary result)   Collection Time: 2020/11/03 12:36 PM  Result Value Ref Range   Unit Number [id number]    Blood Component Type PLTP2 PSORALEN TREATED    Unit division 00    Status of Unit ISSUED    Transfusion Status      OK TO TRANSFUSE Performed at [facility] [address], [place], [place] [address]   CBC     Status: Abnormal   Collection Time: 11-03-20 12:40 PM  Result Value Ref Range   WBC 19.6 (H) 4.0 - 10.5 K/uL   RBC 2.20 (L) 3.87 - 5.11 MIL/uL   Hemoglobin 7.1 (L) 12.0 - 15.0 g/dL   HCT 23.6 (L) 36 - 46 %   MCV 107.3 (H) 80.0 - 100.0 fL   MCH 32.3 26.0 - 34.0 pg   MCHC 30.1 30.0 - 36.0 g/dL   RDW 17.6 (H) 11.5 - 15.5 %   Platelets 170 150 - 400 K/uL   nRBC 0.2 0.0 - 0.2 %  Protime-INR     Status: Abnormal   Collection Time: 03-Nov-2020 12:40 PM  Result Value Ref Range   Prothrombin Time 24.7 (H) 11.4 - 15.2 seconds   INR 2.3 (H) 0.8 - 1.2  Prepare fresh frozen plasma     Status: None (Preliminary result)   Collection Time: November 03, 2020  1:21 PM  Result Value Ref Range   Unit Number [id number]    Blood Component Type  THAWED PLASMA    Unit division 00    Status of Unit ISSUED    Transfusion Status OK TO TRANSFUSE    Unit Number [id number]    Blood Component Type THW PLS APHR    Unit division 00    Status of Unit ISSUED    Transfusion Status      OK TO TRANSFUSE Performed at [facility], [address] [place], [place] [address]   I-STAT 7, (LYTES, BLD GAS, ICA, H+H)     Status: Abnormal  Collection Time: Oct 28, 2020  1:31 PM  Result Value Ref Range   pH, Arterial 7.052 (LL) 7.35 - 7.45   pCO2 arterial 35.8 32 - 48 mmHg   pO2, Arterial 285 (H) 83 - 108 mmHg   Bicarbonate 10.1 (L) 20.0 - 28.0 mmol/L   TCO2 11 (L) 22 - 32 mmol/L   O2 Saturation 100.0 %   Acid-base deficit 19.0 (H) 0.0 - 2.0 mmol/L   Sodium 145 135 - 145 mmol/L   Potassium 5.3 (H) 3.5 - 5.1 mmol/L   Calcium, Ion 0.68 (LL) 1.15 - 1.40 mmol/L   HCT 24.0 (L) 36 - 46 %   Hemoglobin 8.2 (L) 12.0 - 15.0 g/dL   Patient temperature 97.4 F    Collection site [employer] by [employer]    Sample type ARTERIAL   Lactic acid, plasma     Status: Abnormal   Collection Time: 28-Oct-2020  1:37 PM  Result Value Ref Range   Lactic Acid, Venous >11.0 (HH) 0.5 - 1.9 mmol/L  Protime-INR     Status: Abnormal   Collection Time: 10/28/2020  1:37 PM  Result Value Ref Range   Prothrombin Time 24.5 (H)

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
## 2020-11-07 NOTE — Progress Notes (Signed)
CRITICAL VALUE ALERT  Critical Value: troponin of 66 and lactic acid of 10.2  Date & Time Notied: 10/31/20 0630  Provider Notified: E Link  Orders Received/Actions taken: bicarb push and drip
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
## 2020-11-07 NOTE — Progress Notes (Signed)
Trauma MD at bedside and aware of patient MAP<65 despite being maxed on all vasopressors. This RN called patient's daughter Katharine Look and notified her of patient's deteriorating. Daugher to gather family and come to hospital to see patient.  Candy Sledge, RN

## 2020-11-07 DEATH — deceased
